# Patient Record
Sex: Male | Born: 1965 | Race: White | Hispanic: No | Marital: Married | Smoking: Never smoker
Health system: Southern US, Community
[De-identification: ages and names within clinical notes are randomized; demographics above are authoritative.]

## PROBLEM LIST (undated history)

## (undated) DIAGNOSIS — Z800 Family history of malignant neoplasm of digestive organs: Secondary | ICD-10-CM

## (undated) DIAGNOSIS — E7800 Pure hypercholesterolemia, unspecified: Secondary | ICD-10-CM

## (undated) DIAGNOSIS — R519 Headache, unspecified: Secondary | ICD-10-CM

## (undated) DIAGNOSIS — C772 Secondary and unspecified malignant neoplasm of intra-abdominal lymph nodes: Secondary | ICD-10-CM

## (undated) DIAGNOSIS — C187 Malignant neoplasm of sigmoid colon: Secondary | ICD-10-CM

## (undated) DIAGNOSIS — N289 Disorder of kidney and ureter, unspecified: Secondary | ICD-10-CM

## (undated) DIAGNOSIS — Z923 Personal history of irradiation: Secondary | ICD-10-CM

## (undated) DIAGNOSIS — E291 Testicular hypofunction: Secondary | ICD-10-CM

## (undated) DIAGNOSIS — Z87440 Personal history of urinary (tract) infections: Secondary | ICD-10-CM

## (undated) DIAGNOSIS — G709 Myoneural disorder, unspecified: Secondary | ICD-10-CM

## (undated) DIAGNOSIS — Z87442 Personal history of urinary calculi: Secondary | ICD-10-CM

## (undated) DIAGNOSIS — I10 Essential (primary) hypertension: Secondary | ICD-10-CM

## (undated) DIAGNOSIS — H8110 Benign paroxysmal vertigo, unspecified ear: Secondary | ICD-10-CM

## (undated) DIAGNOSIS — Z85038 Personal history of other malignant neoplasm of large intestine: Secondary | ICD-10-CM

## (undated) DIAGNOSIS — Z8041 Family history of malignant neoplasm of ovary: Secondary | ICD-10-CM

## (undated) DIAGNOSIS — R51 Headache: Secondary | ICD-10-CM

## (undated) DIAGNOSIS — K649 Unspecified hemorrhoids: Secondary | ICD-10-CM

## (undated) DIAGNOSIS — K219 Gastro-esophageal reflux disease without esophagitis: Secondary | ICD-10-CM

## (undated) DIAGNOSIS — Z8042 Family history of malignant neoplasm of prostate: Secondary | ICD-10-CM

## (undated) HISTORY — DX: Gastro-esophageal reflux disease without esophagitis: K21.9

## (undated) HISTORY — DX: Myoneural disorder, unspecified: G70.9

## (undated) HISTORY — PX: TONSILLECTOMY: SUR1361

## (undated) HISTORY — PX: UPPER GASTROINTESTINAL ENDOSCOPY: SHX188

## (undated) HISTORY — PX: LITHOTRIPSY: SUR834

## (undated) HISTORY — DX: Testicular hypofunction: E29.1

## (undated) HISTORY — DX: Secondary and unspecified malignant neoplasm of intra-abdominal lymph nodes: C77.2

## (undated) HISTORY — DX: Personal history of irradiation: Z92.3

## (undated) HISTORY — DX: Family history of malignant neoplasm of prostate: Z80.42

## (undated) HISTORY — DX: Personal history of other malignant neoplasm of large intestine: Z85.038

## (undated) HISTORY — DX: Family history of malignant neoplasm of digestive organs: Z80.0

## (undated) HISTORY — PX: COLONOSCOPY: SHX174

## (undated) HISTORY — DX: Family history of malignant neoplasm of ovary: Z80.41

## (undated) HISTORY — DX: Malignant neoplasm of sigmoid colon: C18.7

---

## 1999-02-16 ENCOUNTER — Encounter: Payer: Self-pay | Admitting: Internal Medicine

## 1999-02-16 ENCOUNTER — Emergency Department (HOSPITAL_COMMUNITY): Admission: EM | Admit: 1999-02-16 | Discharge: 1999-02-16 | Payer: Self-pay | Admitting: Internal Medicine

## 2001-05-08 ENCOUNTER — Encounter: Payer: Self-pay | Admitting: Urology

## 2001-05-08 ENCOUNTER — Ambulatory Visit (HOSPITAL_BASED_OUTPATIENT_CLINIC_OR_DEPARTMENT_OTHER): Admission: RE | Admit: 2001-05-08 | Discharge: 2001-05-08 | Payer: Self-pay | Admitting: Urology

## 2001-10-24 ENCOUNTER — Encounter: Payer: Self-pay | Admitting: Family Medicine

## 2001-10-24 ENCOUNTER — Encounter: Admission: RE | Admit: 2001-10-24 | Discharge: 2001-10-24 | Payer: Self-pay | Admitting: Family Medicine

## 2005-10-04 ENCOUNTER — Observation Stay (HOSPITAL_COMMUNITY): Admission: EM | Admit: 2005-10-04 | Discharge: 2005-10-05 | Payer: Self-pay | Admitting: *Deleted

## 2005-10-11 ENCOUNTER — Ambulatory Visit (HOSPITAL_COMMUNITY): Admission: RE | Admit: 2005-10-11 | Discharge: 2005-10-11 | Payer: Self-pay | Admitting: Urology

## 2011-01-22 ENCOUNTER — Encounter: Payer: Self-pay | Admitting: Emergency Medicine

## 2011-01-22 ENCOUNTER — Emergency Department (HOSPITAL_COMMUNITY)
Admission: EM | Admit: 2011-01-22 | Discharge: 2011-01-22 | Disposition: A | Discharge status: Home / Self Care | Payer: BC Managed Care – PPO | Attending: Emergency Medicine | Admitting: Emergency Medicine

## 2011-01-22 DIAGNOSIS — R198 Other specified symptoms and signs involving the digestive system and abdomen: Secondary | ICD-10-CM | POA: Insufficient documentation

## 2011-01-22 DIAGNOSIS — E789 Disorder of lipoprotein metabolism, unspecified: Secondary | ICD-10-CM | POA: Insufficient documentation

## 2011-01-22 DIAGNOSIS — K648 Other hemorrhoids: Secondary | ICD-10-CM

## 2011-01-22 DIAGNOSIS — K625 Hemorrhage of anus and rectum: Secondary | ICD-10-CM | POA: Insufficient documentation

## 2011-01-22 DIAGNOSIS — I10 Essential (primary) hypertension: Secondary | ICD-10-CM | POA: Insufficient documentation

## 2011-01-22 DIAGNOSIS — H5789 Other specified disorders of eye and adnexa: Secondary | ICD-10-CM | POA: Insufficient documentation

## 2011-01-22 HISTORY — DX: Essential (primary) hypertension: I10

## 2011-01-22 HISTORY — DX: Disorder of kidney and ureter, unspecified: N28.9

## 2011-01-22 HISTORY — DX: Pure hypercholesterolemia, unspecified: E78.00

## 2011-01-22 MED ORDER — HYDROCORTISONE 2.5 % RE CREA: TOPICAL_CREAM | RECTAL | Status: AC

## 2011-01-22 MED ORDER — DOCUSATE SODIUM 100 MG PO CAPS: 100.0000 mg | ORAL_CAPSULE | Freq: Two times a day (BID) | ORAL | Status: AC

## 2011-01-22 NOTE — ED Provider Notes (Signed)
History   This chart was scribed for Joya Gaskins, MD by Charolett Bumpers . The patient was seen in room APA12/APA12 and the patient's care was started at 3:08pm.    CSN: [id number]  Arrival date & time 01/22/11  1358   First MD Initiated Contact with Patient 01/22/11 1501      Chief Complaint  Patient presents with  . Rectal Bleeding     HPI Vincent Strickland is a 45 y.o. male who presents to the Emergency Department complaining of a single episode of constant, moderate rectal bleeding that was bright red with associated swelling. The patient denies pain, vomiting, abdominal pain, fever, weakness, and dizziness. The patient denies pain using the restroom, and no prior episodes this severe. The patient takes 1 aspirin a day, but denies taking anti-inflammatories daily. Patient states having no other major medical problems or prior major bleeding episodes before. Patient states he did have hemorrhoids 20 years ago.  No vomiting Improved by - nothing Worsened by - nothing  Past Medical History  Diagnosis Date  . Renal disorder   . Hypertension   . High cholesterol     Past Surgical History  Procedure Date  . Lithotripsy   . Tonsillectomy     No family history on file.  History  Substance Use Topics  . Smoking status: Never Smoker   . Smokeless tobacco: Not on file  . Alcohol Use: No      Review of Systems A complete 10 system review of systems was obtained and is otherwise negative except as noted in the HPI and PMH.    Allergies  Review of patient's allergies indicates no known allergies.  Home Medications  No current outpatient prescriptions on file.  BP 163/97  Pulse 81  Temp 98.3 F (36.8 C)  Resp 20  Ht 6\' 1"  (1.854 m)  Wt 235 lb (106.595 kg)  BMI 31.00 kg/m2  SpO2 100%  Physical Exam  Nursing note and vitals reviewed. Constitutional: He is oriented to person, place, and time. He appears well-developed and well-nourished. No distress.    HENT:  Head: Normocephalic and atraumatic.  Eyes: EOM are normal. Pupils are equal, round, and reactive to light.       Conjunctiva pink  Neck: Neck supple. No tracheal deviation present.  Cardiovascular: Normal rate.   Pulmonary/Chest: Effort normal. No respiratory distress.  Abdominal: Soft. He exhibits no distension.  Musculoskeletal: Normal range of motion. He exhibits no edema.  Neurological: He is alert and oriented to person, place, and time. No sensory deficit.  Skin: Skin is warm and dry.  Psychiatric: He has a normal mood and affect. His behavior is normal.  rectal  - gross blood noted but not actively bleeding, no melena, large protrusion that appears to be hemorrhoid No abscess, no prolapse.  I can easily pass finger into rectum.   ED Course  Procedures   DIAGNOSTIC STUDIES: Oxygen Saturation is 100% on room air, normal by my interpretation.    COORDINATION OF CARE:     3:32 PM D/w with dr Lovell Sheehan, likely internal hemorrhoids, start stool softeners, anusol, f/u next week Pt agreeable with plan  MDM  Nursing notes reviewed and considered in documentation    I personally performed the services described in this documentation, which was scribed in my presence. The recorded information has been reviewed and considered.         Joya Gaskins, MD 01/22/11 [phone]

## 2011-01-22 NOTE — ED Notes (Signed)
Pt states that he was at work, having a bowel movement, pt states that he has been constipated lately, was "bearing down" to produce bowel movement, had some lower abd discomfort with the bowel movement, but when pt was finished he wiped and noticed bright red blood, pt states that he felt something on his rectum area the size of a  "marble" pt had bleed through his jeans on arrival to triage, pt states that the bleeding has slowed now, denies any pain, has not had any problems with hemorrhoids before,

## 2011-01-22 NOTE — ED Notes (Signed)
Pt c/o bright red blood in toilet after having bowel movement x 1 hour ago. Pt states he "felt something" from his rectum when he cleaned off.

## 2011-01-22 NOTE — ED Notes (Signed)
Pt has a bowel movement prior to discharge, tolerated well,

## 2012-06-27 ENCOUNTER — Encounter: Payer: Self-pay | Admitting: Physician Assistant

## 2012-06-27 ENCOUNTER — Encounter: Payer: Self-pay | Admitting: Urology

## 2012-07-20 ENCOUNTER — Ambulatory Visit: Payer: Self-pay | Admitting: Family Medicine

## 2012-07-26 ENCOUNTER — Encounter: Payer: Self-pay | Admitting: Physician Assistant

## 2012-07-26 ENCOUNTER — Ambulatory Visit: Payer: Self-pay | Admitting: Family Medicine

## 2012-07-26 ENCOUNTER — Ambulatory Visit (INDEPENDENT_AMBULATORY_CARE_PROVIDER_SITE_OTHER): Payer: 59 | Admitting: Physician Assistant

## 2012-07-26 VITALS — BP 130/90 | HR 68 | Temp 98.5°F | Resp 16 | Ht 73.0 in | Wt 231.0 lb

## 2012-07-26 DIAGNOSIS — H8110 Benign paroxysmal vertigo, unspecified ear: Secondary | ICD-10-CM

## 2012-07-26 MED ORDER — MECLIZINE HCL 25 MG PO TABS: 25.0000 mg | ORAL_TABLET | Freq: Three times a day (TID) | ORAL | Status: DC | PRN

## 2012-07-26 NOTE — Progress Notes (Signed)
   Patient ID: Vincent Strickland MRN: [id number], DOB: 15-Dec-1965, 47 y.o. Date of Encounter: 07/26/2012, 8:17 PM    Chief Complaint:  Chief Complaint  Patient presents with  . Dizziness     HPI: 47 y.o. year old white male is here for eval of vertigo. He says this started Saturday night (4-5 days ago). During the night, when he rolled over in bed, there was a spinning sensation. Actual spinning. The next morning, when he first woke up, he felt a little stagerry/ like on a boat. But after up for a hour or so this resolved and he felt normal during the day. Sunday night he had no problem. But, on both Monday and Tuesday nights, he again had vertigo when he would roll over in bed. He has had no symptoms during the day. He does remember one time when he tilted his head back to look up at something, he felt mild symtoms but that was the only time. He has not noticed any symtoms when looking to the side, turning his head to the side.  Has had no recent rhinorrhea, nasal congestion, cough, fever, etc.  No other neurologic symptoms.   Home Meds: See attached medication section for any medications that were entered at today's visit. The computer does not put those onto this list.The following list is a list of meds entered prior to today's visit.   No current outpatient prescriptions on file prior to visit.   No current facility-administered medications on file prior to visit.    Allergies: No Known Allergies    Review of Systems: See HPI for pertinent ROS. All other ROS negative.    Physical Exam: Blood pressure 130/90, pulse 68, temperature 98.5 F (36.9 C), temperature source Oral, resp. rate 16, height 6\' 1"  (1.854 m), weight 231 lb (104.781 kg)., Body mass index is 30.48 kg/(m^2). General: WM. Appears in no acute distress. HEENT: Normocephalic, atraumatic, eyes without discharge, sclera non-icteric, nares are without discharge. Bilateral auditory canals clear, TM's are without perforation,  pearly grey and translucent with reflective cone of light bilaterally. Oral cavity moist, posterior pharynx without exudate, erythema, peritonsillar abscess, or post nasal drip.  Neck: Supple. No thyromegaly. No lymphadenopathy. Lungs: Clear bilaterally to auscultation without wheezes, rales, or rhonchi. Breathing is unlabored. Heart: Regular rhythm. No murmurs, rubs, or gallops. Msk:  Strength and tone normal for age. Neuro: Alert and oriented X 3. Moves all extremities spontaneously. Gait is normal. CNII-XII grossly in tact.Romberg normal. Patria Mane: Negative now bilaterally.   Psych:  Responds to questions appropriately with a normal affect.     ASSESSMENT AND PLAN:  47 y.o. year old male with  1. Benign paroxysmal positional vertigo - meclizine (ANTIVERT) 25 MG tablet; Take 1 tablet (25 mg total) by mouth 3 (three) times daily as needed.  Dispense: 30 tablet; Refill: 0 If does not resolve in one week call me and will refer for therapy.  [address] [place], [place], [facility] 07/26/2012 8:17 PM

## 2012-10-04 ENCOUNTER — Encounter: Payer: Self-pay | Admitting: Physician Assistant

## 2012-10-04 ENCOUNTER — Ambulatory Visit (INDEPENDENT_AMBULATORY_CARE_PROVIDER_SITE_OTHER): Payer: 59 | Admitting: Physician Assistant

## 2012-10-04 VITALS — BP 138/96 | HR 68 | Temp 98.1°F | Resp 18 | Ht 73.0 in | Wt 232.0 lb

## 2012-10-04 DIAGNOSIS — E7800 Pure hypercholesterolemia, unspecified: Secondary | ICD-10-CM

## 2012-10-04 DIAGNOSIS — N289 Disorder of kidney and ureter, unspecified: Secondary | ICD-10-CM

## 2012-10-04 DIAGNOSIS — I10 Essential (primary) hypertension: Secondary | ICD-10-CM

## 2012-10-04 DIAGNOSIS — E291 Testicular hypofunction: Secondary | ICD-10-CM

## 2012-10-04 MED ORDER — LOSARTAN POTASSIUM-HCTZ 100-25 MG PO TABS: 1.0000 | ORAL_TABLET | Freq: Every day | ORAL | Status: DC

## 2012-10-04 NOTE — Progress Notes (Signed)
Patient ID: Vincent Strickland MRN: [id number], DOB: 1965-06-13, 47 y.o. Date of Encounter: 10/04/2012, 12:04 PM    Chief Complaint:  Chief Complaint  Patient presents with  . med refills    is fasting     HPI: 47 y.o. year old white male here to followup his hypertension. His last regular office visit for this was on February 2014 with Dr. Modesto Charon. He says that he is taking that losartan 100 mg daily and took it this morning. He's been having no adverse effects with this. He does say that his prior blood pressure pill was changed to this because he was having some fatigue with his prior blood pressure medicine. He has no complaints today. He's been feeling pretty good. No lightheadedness or lower extremity edema or other adverse affects of his blood pressure pill.  Home Meds: See attached medication section for any medications that were entered at today's visit. The computer does not put those onto this list.The following list is a list of meds entered prior to today's visit.   Current Outpatient Prescriptions on File Prior to Visit  Medication Sig Dispense Refill  . meclizine (ANTIVERT) 25 MG tablet Take 1 tablet (25 mg total) by mouth 3 (three) times daily as needed.  30 tablet  0   No current facility-administered medications on file prior to visit.    Allergies:  Allergies  Allergen Reactions  . Niaspan [Niacin]     Severe flushing  . Percocet [Oxycodone-Acetaminophen] Nausea Only    hallucinations      Review of Systems: See HPI for pertinent ROS. All other ROS negative.    Physical Exam: Blood pressure 138/96, pulse 68, temperature 98.1 F (36.7 C), temperature source Oral, resp. rate 18, height 6\' 1"  (1.854 m), weight 232 lb (105.235 kg)., Body mass index is 30.62 kg/(m^2). Blood pressures by me on the right at 155/90 and on the left 150/100 General: Well-nourished well-developed white male.  Appears in no acute distress. Neck: Supple. No thyromegaly. No lymphadenopathy.  No carotid bruits Lungs: Clear bilaterally to auscultation without wheezes, rales, or rhonchi. Breathing is unlabored. Heart: Regular rhythm. No murmurs, rubs, or gallops. Msk:  Strength and tone normal for age. Extremities/Skin: Warm and dry. No clubbing or cyanosis. No edema. No rashes or suspicious lesions. Neuro: Alert and oriented X 3. Moves all extremities spontaneously. Gait is normal. CNII-XII grossly in tact. Psych:  Responds to questions appropriately with a normal affect.     ASSESSMENT AND PLAN:  47 y.o. year old male with  1. Hypertension His blood pressure is suboptimal and need to increase medications. In the past he had been on lisinopril HCTZ. I discussed with him that I really think it is probably the HCTZ component of this medicine that was causing his fatigue and weakness. However he absolutely refuses for me to add a second pill to his losartan. He says that he just does not want to have to take 2 pills every day and does not want to have to pay for 2 separate pills. I told him I can give him I could add a generic that would cost $4 and that he can take the  2 pills at the same time together at one time per day. Still refuses. Therefore we'll go ahead and try just adding HCTZ to that losartan 30 to take one pill per day. BMET was completely normal on 02/16/12. Therefore can just wait and should recheck bmet at his next office visit in 2 weeks. -  losartan-hydrochlorothiazide (HYZAAR) 100-25 MG per tablet; Take 1 tablet by mouth daily.  Dispense: 90 tablet; Refill: 3 Followup office visit in 2 weeks to recheck blood pressure on this medication and check the mats on this medication. I instructed him to make sure to take the pill prior to the visit.  2. High cholesterol I reviewed his last lipid profile from 02/16/12.  However he absolutely refuses to take medication for this. He says that he has made dietary changes and will continue his dietary changes. The only cardiac risk  factor is his hypertension so this is reasonable to just continue his diet.  3. Hypogonadism male History of low testosterone and he has deferred treatment and still wants to stay off of treatment for this.  4. Renal disorder He reports that he has had multiple kidney stones and sees urology routinely for this. Says he just recently passed a large stone.  Follow up office visit in 2 weeks to recheck blood pressure and BMET on new medication.   [address] [place], [place], [facility] 10/04/2012 12:04 PM

## 2012-10-05 ENCOUNTER — Telehealth: Payer: Self-pay | Admitting: Physician Assistant

## 2012-10-05 NOTE — Telephone Encounter (Signed)
Patient/wife upset with 90 day Rx for new BP med.  Does not want to pay for 90 days and then it does not work.  Only wants 30 day supply to start out with.  Pharmacy called and asked to dispense only 30 day supply for now.

## 2012-10-05 NOTE — Telephone Encounter (Signed)
That is fine to change to # 30 / 0 refills--b/c he is to have f/u appt and labs in 2-3 weeks.

## 2012-10-19 ENCOUNTER — Encounter: Payer: Self-pay | Admitting: Physician Assistant

## 2012-10-19 ENCOUNTER — Ambulatory Visit (INDEPENDENT_AMBULATORY_CARE_PROVIDER_SITE_OTHER): Payer: 59 | Admitting: Physician Assistant

## 2012-10-19 VITALS — BP 136/96 | HR 60 | Temp 98.2°F | Resp 18 | Wt 235.0 lb

## 2012-10-19 DIAGNOSIS — N289 Disorder of kidney and ureter, unspecified: Secondary | ICD-10-CM

## 2012-10-19 DIAGNOSIS — E7800 Pure hypercholesterolemia, unspecified: Secondary | ICD-10-CM

## 2012-10-19 DIAGNOSIS — E291 Testicular hypofunction: Secondary | ICD-10-CM

## 2012-10-19 DIAGNOSIS — I10 Essential (primary) hypertension: Secondary | ICD-10-CM

## 2012-10-19 LAB — BASIC METABOLIC PANEL WITH GFR
CO2: 31 mEq/L (ref 19–32)
Chloride: 102 mEq/L (ref 96–112)
Glucose, Bld: 92 mg/dL (ref 70–99)
Sodium: 141 mEq/L (ref 135–145)

## 2012-10-19 NOTE — Progress Notes (Signed)
   Patient ID: Avari Gelles MRN: [id number], DOB: 21-Nov-1965, 47 y.o. Date of Encounter: 10/19/2012, 2:13 PM    Chief Complaint:  Chief Complaint  Patient presents with  . follow up after starting new BP med     HPI: 47 y.o. year old white male old with hypertension. Last office visit with me was on 10/04/12. At that time his blood pressure with found to be elevated. Wire to that office visit he was on losartan 100 mg daily. At that visit I discussed adding another medication. He absolutely refused to take 2 pills even if it could be generic $4 pills. Therefore we ended up prescribing losartan HCT 100/25 mg. He is here for followup. He has been taking this new medication. Says he is having absolutely no adverse effects. Has not had his blood pressure checked anywhere else since the last office visit here.     Home Meds: See attached medication section for any medications that were entered at today's visit. The computer does not put those onto this list.The following list is a list of meds entered prior to today's visit.   Current Outpatient Prescriptions on File Prior to Visit  Medication Sig Dispense Refill  . losartan-hydrochlorothiazide (HYZAAR) 100-25 MG per tablet Take 1 tablet by mouth daily.  90 tablet  3  . meclizine (ANTIVERT) 25 MG tablet Take 1 tablet (25 mg total) by mouth 3 (three) times daily as needed.  30 tablet  0   No current facility-administered medications on file prior to visit.    Allergies:  Allergies  Allergen Reactions  . Niaspan [Niacin]     Severe flushing  . Percocet [Oxycodone-Acetaminophen] Nausea Only    hallucinations      Review of Systems: See HPI for pertinent ROS. All other ROS negative.    Physical Exam: Blood pressure 136/96, pulse 60, temperature 98.2 F (36.8 C), temperature source Oral, resp. rate 18, weight 235 lb (106.595 kg)., Body mass index is 31.01 kg/(m^2). General: Well-nourished well-developed white male. Appears in no acute  distress. Neck: Supple. No thyromegaly. No lymphadenopathy. No carotid bruit. Lungs: Clear bilaterally to auscultation without wheezes, rales, or rhonchi. Breathing is unlabored. Heart: Regular rhythm. No murmurs, rubs, or gallops. Msk:  Strength and tone normal for age. Extremities/Skin: Warm and dry. No clubbing or cyanosis. No edema. No rashes or suspicious lesions. Neuro: Alert and oriented X 3. Moves all extremities spontaneously. Gait is normal. CNII-XII grossly in tact. Psych:  Responds to questions appropriately with a normal affect.     ASSESSMENT AND PLAN:  48 y.o. year old male with  1. Hypertension We'll continue current medication. Losartan HCT 100/25. Check lab on this medication - BASIC METABOLIC PANEL WITH GFR  2. Renal disorder He reports that he has had multiple kidney stones and he sees urology routinely for this. Just recently passed a large stone.  3. High cholesterol His recent office visit 10/04/12 I discussed his last lipid profile from 02/16/12. However he absolutely refused to take any medication for this. He said that he had made dietary changes and to continue dietary changes. The only cardiac risk factor he has his hypertension without it was reasonable to discontinue his diet.  4. Hypogonadism male H/O low testosterone and he is deferred treatment and still wants to stay off of treatment for this.  Regular office visit and lab in 6 months.   Murray Hodgkins [place], [place], [facility] 10/19/2012 2:13 PM

## 2012-10-23 ENCOUNTER — Encounter: Payer: Self-pay | Admitting: Family Medicine

## 2013-01-30 ENCOUNTER — Other Ambulatory Visit: Payer: Self-pay | Admitting: Physician Assistant

## 2013-01-30 NOTE — Telephone Encounter (Signed)
Medication refilled per protocol. 

## 2013-08-17 ENCOUNTER — Other Ambulatory Visit: Payer: Self-pay | Admitting: Physician Assistant

## 2013-08-17 ENCOUNTER — Encounter: Payer: Self-pay | Admitting: Family Medicine

## 2013-08-17 DIAGNOSIS — I10 Essential (primary) hypertension: Secondary | ICD-10-CM

## 2013-08-17 NOTE — Telephone Encounter (Signed)
Medication refill for one time only.  Patient needs to be seen.  Letter sent for patient to call and schedule 

## 2013-09-19 ENCOUNTER — Other Ambulatory Visit: Payer: Self-pay | Admitting: Physician Assistant

## 2013-10-22 ENCOUNTER — Telehealth: Payer: Self-pay | Admitting: Family Medicine

## 2013-10-22 MED ORDER — LOSARTAN POTASSIUM-HCTZ 100-25 MG PO TABS
1.0000 | ORAL_TABLET | Freq: Every day | ORAL | Status: DC
Start: 2013-10-22 — End: 2014-05-08

## 2013-10-22 NOTE — Telephone Encounter (Signed)
Medication refilled per protocol. 

## 2013-11-20 ENCOUNTER — Encounter: Payer: Self-pay | Admitting: Family Medicine

## 2013-11-20 ENCOUNTER — Ambulatory Visit (INDEPENDENT_AMBULATORY_CARE_PROVIDER_SITE_OTHER): Payer: Managed Care, Other (non HMO) | Admitting: Family Medicine

## 2013-11-20 VITALS — BP 128/74 | HR 74 | Temp 98.2°F | Resp 16 | Ht 73.0 in | Wt 256.0 lb

## 2013-11-20 DIAGNOSIS — Z23 Encounter for immunization: Secondary | ICD-10-CM

## 2013-11-20 DIAGNOSIS — S39011A Strain of muscle, fascia and tendon of abdomen, initial encounter: Secondary | ICD-10-CM

## 2013-11-20 DIAGNOSIS — S76219A Strain of adductor muscle, fascia and tendon of unspecified thigh, initial encounter: Secondary | ICD-10-CM

## 2013-11-20 NOTE — Progress Notes (Signed)
   Subjective:    Patient ID: Vincent Strickland, male    DOB: 1965/02/17, 48 y.o.   MRN: [id number]  HPI  Patient was exercising yesterday performing curls. He was really straining to perform his final rep he felt a severe pain in his lower abdomen and in his growing. There is no bulge there today. There is no palpable hernia. Over the last 24 hours the pain has improved remarkably. Today he has no pain at all although there is some mild discomfort with position changes in his lower abdomen and in his groin. He denies any hematuria or dysuria. He denies any melena or hematochezia. He denies any nausea vomiting fever or diarrhea.  On exam there is no palpable hernia. Past Medical History  Diagnosis Date  . Hypertension   . High cholesterol   . Hypogonadism male   . Renal disorder     kidney stones   Past Surgical History  Procedure Laterality Date  . Lithotripsy    . Tonsillectomy     Current Outpatient Prescriptions on File Prior to Visit  Medication Sig Dispense Refill  . losartan-hydrochlorothiazide (HYZAAR) 100-25 MG per tablet Take 1 tablet by mouth daily.  30 tablet  5  . meclizine (ANTIVERT) 25 MG tablet Take 1 tablet (25 mg total) by mouth 3 (three) times daily as needed.  30 tablet  0   No current facility-administered medications on file prior to visit.   Allergies  Allergen Reactions  . Niaspan [Niacin]     Severe flushing  . Percocet [Oxycodone-Acetaminophen] Nausea Only    hallucinations   History   Social History  . Marital Status: Married    Spouse Name: N/A    Number of Children: N/A  . Years of Education: N/A   Occupational History  . Not on file.   Social History Main Topics  . Smoking status: Never Smoker   . Smokeless tobacco: Never Used  . Alcohol Use: No  . Drug Use: No  . Sexual Activity: Not on file   Other Topics Concern  . Not on file   Social History Narrative  . No narrative on file     Review of Systems  All other systems reviewed  and are negative.      Objective:   Physical Exam  Vitals reviewed. Constitutional: He appears well-developed and well-nourished.  Cardiovascular: Normal rate and regular rhythm.   Pulmonary/Chest: Effort normal and breath sounds normal.  Abdominal: Soft. Bowel sounds are normal. He exhibits no distension and no mass. There is no tenderness. There is no rebound and no guarding.  Musculoskeletal: Normal range of motion. He exhibits no edema and no tenderness.          Assessment & Plan:  Groin strain, initial encounter  I did not appreciate a hernia today on examination. I believe the patient has strained his growing. I recommended rest, avoidance of strenuous activity for 2 weeks, and tincture of time. If symptoms worsen, I want to recheck him immediately for possible hernia.

## 2013-11-20 NOTE — Addendum Note (Signed)
Addended by: Shary Decamp B on: 11/20/2013 12:21 PM   Modules accepted: Orders

## 2014-05-08 ENCOUNTER — Other Ambulatory Visit: Payer: Self-pay | Admitting: Physician Assistant

## 2014-05-08 NOTE — Telephone Encounter (Signed)
Refill appropriate and filled per protocol. 

## 2014-08-15 ENCOUNTER — Other Ambulatory Visit: Payer: Self-pay | Admitting: Family Medicine

## 2014-08-16 ENCOUNTER — Encounter: Payer: Self-pay | Admitting: Family Medicine

## 2014-08-16 NOTE — Telephone Encounter (Signed)
Medication refill for one time only.  Patient needs to be seen.  Letter sent for patient to call and schedule 

## 2014-09-14 ENCOUNTER — Other Ambulatory Visit: Payer: Self-pay | Admitting: Family Medicine

## 2014-12-30 ENCOUNTER — Other Ambulatory Visit: Payer: Self-pay | Admitting: Family Medicine

## 2014-12-30 MED ORDER — LOSARTAN POTASSIUM-HCTZ 100-25 MG PO TABS: ORAL_TABLET | ORAL | Status: DC

## 2015-05-06 ENCOUNTER — Encounter: Payer: Self-pay | Admitting: Gastroenterology

## 2015-05-06 ENCOUNTER — Encounter: Payer: Self-pay | Admitting: Family Medicine

## 2015-05-06 ENCOUNTER — Ambulatory Visit (INDEPENDENT_AMBULATORY_CARE_PROVIDER_SITE_OTHER): Payer: BLUE CROSS/BLUE SHIELD | Admitting: Family Medicine

## 2015-05-06 VITALS — BP 110/74 | HR 78 | Temp 98.2°F | Resp 16 | Ht 73.0 in | Wt 239.0 lb

## 2015-05-06 DIAGNOSIS — I10 Essential (primary) hypertension: Secondary | ICD-10-CM

## 2015-05-06 DIAGNOSIS — K625 Hemorrhage of anus and rectum: Secondary | ICD-10-CM | POA: Diagnosis not present

## 2015-05-06 LAB — LIPID PANEL
CHOLESTEROL: 224 mg/dL — AB (ref 125–200)
HDL: 32 mg/dL — AB (ref >=40)
LDL Cholesterol: 153 mg/dL — ABNORMAL HIGH (ref <130)
TRIGLYCERIDES: 196 mg/dL — AB (ref <150)
Total CHOL/HDL Ratio: 7 Ratio — ABNORMAL HIGH (ref <=5.0)
VLDL: 39 mg/dL — ABNORMAL HIGH (ref <30)

## 2015-05-06 LAB — CBC WITH DIFFERENTIAL/PLATELET
BASOS ABS: 70 {cells}/uL (ref 0–200)
BASOS PCT: 1 %
Eosinophils Absolute: 140 cells/uL (ref 15–500)
Eosinophils Relative: 2 %
HEMATOCRIT: 46.4 % (ref 38.5–50.0)
Hemoglobin: 16.3 g/dL (ref 13.0–17.0)
LYMPHS ABS: 2590 {cells}/uL (ref 850–3900)
LYMPHS PCT: 37 %
MCH: 31.2 pg (ref 27.0–33.0)
MCHC: 35.1 g/dL (ref 32.0–36.0)
MCV: 88.9 fL (ref 80.0–100.0)
MONO ABS: 420 {cells}/uL (ref 200–950)
MONOS PCT: 6 %
MPV: 9.4 fL (ref 7.5–12.5)
Neutro Abs: 3780 cells/uL (ref 1500–7800)
Neutrophils Relative %: 54 %
PLATELETS: 329 10*3/uL (ref 140–400)
RBC: 5.22 MIL/uL (ref 4.20–5.80)
RDW: 13.9 % (ref 11.0–15.0)
WBC: 7 10*3/uL (ref 3.8–10.8)

## 2015-05-06 LAB — COMPLETE METABOLIC PANEL WITH GFR
ALK PHOS: 70 U/L (ref 40–115)
ALT: 34 U/L (ref 9–46)
AST: 22 U/L (ref 10–40)
Albumin: 4.6 g/dL (ref 3.6–5.1)
BILIRUBIN TOTAL: 1 mg/dL (ref 0.2–1.2)
BUN: 17 mg/dL (ref 7–25)
CALCIUM: 9.6 mg/dL (ref 8.6–10.3)
CO2: 26 mmol/L (ref 20–31)
CREATININE: 1.01 mg/dL (ref 0.60–1.35)
Chloride: 102 mmol/L (ref 98–110)
GFR, EST NON AFRICAN AMERICAN: 88 mL/min (ref >=60)
Glucose, Bld: 108 mg/dL — ABNORMAL HIGH (ref 70–99)
Potassium: 3.2 mmol/L — ABNORMAL LOW (ref 3.5–5.3)
Sodium: 143 mmol/L (ref 135–146)
TOTAL PROTEIN: 7.4 g/dL (ref 6.1–8.1)

## 2015-05-06 MED ORDER — LOSARTAN POTASSIUM-HCTZ 50-12.5 MG PO TABS: 1.0000 | ORAL_TABLET | Freq: Every day | ORAL | Status: DC

## 2015-05-06 NOTE — Progress Notes (Signed)
   Subjective:    Patient ID: Vincent Strickland, male    DOB: May 01, 1965, 50 y.o.   MRN: [id number]  HPI Patient is currently on Hyzaar 100/25 one by mouth daily. His blood pressure is consistently 110-120/70-80. He is interested in taking last blood pressure medication. He denies any chest pain shortness of breath or dyspnea on exertion. He is due for fasting lab work. He is also reporting 4-6 weeks of bright red blood per rectum. He has a long-standing history with hemorrhoids. On rectal exam today he has 3 external skin tags but there is no obvious external hemorrhoid. On rectal exam there is what feels to be a palpable internal hemorrhoid versus skin tag at approximately 5:00 with the patient standing leaning over the exam table. Past Medical History  Diagnosis Date  . Hypertension   . High cholesterol   . Hypogonadism male   . Renal disorder     kidney stones   Past Surgical History  Procedure Laterality Date  . Lithotripsy    . Tonsillectomy     Current Outpatient Prescriptions on File Prior to Visit  Medication Sig Dispense Refill  . losartan-hydrochlorothiazide (HYZAAR) 100-25 MG tablet TAKE 1 TABLET BY MOUTH DAILY.NO MORE REFILLS UNTIL SEEN 30 tablet 0   No current facility-administered medications on file prior to visit.   Allergies  Allergen Reactions  . Niaspan [Niacin]     Severe flushing  . Percocet [Oxycodone-Acetaminophen] Nausea Only    hallucinations   Social History   Social History  . Marital Status: Married    Spouse Name: N/A  . Number of Children: N/A  . Years of Education: N/A   Occupational History  . Not on file.   Social History Main Topics  . Smoking status: Never Smoker   . Smokeless tobacco: Never Used  . Alcohol Use: No  . Drug Use: No  . Sexual Activity: Not on file   Other Topics Concern  . Not on file   Social History Narrative       Review of Systems  All other systems reviewed and are negative.      Objective:   Physical  Exam  Cardiovascular: Normal rate, regular rhythm and normal heart sounds.   Pulmonary/Chest: Effort normal and breath sounds normal. No respiratory distress. He has no wheezes. He has no rales.  Genitourinary: Prostate normal. Rectal exam shows no external hemorrhoid and no fissure.  Vitals reviewed.         Assessment & Plan:  Benign essential HTN - Plan: CBC with Differential/Platelet, COMPLETE METABOLIC PANEL WITH GFR, Lipid panel  BRBPR (bright red blood per rectum) - Plan: Ambulatory referral to Gastroenterology I will consult GI for a colonoscopy. There is a small what feels to be internal hemorrhoid versus polyp at approximately 5:00 with the patient standing leaning over the exam table. However there is no obvious source of bleeding. Blood pressure is excellent. I will reduce the dose of Hyzaar 50/12.5 one by mouth daily and check a CBC, CMP, and fasting lipid panel.

## 2015-05-08 ENCOUNTER — Other Ambulatory Visit: Payer: Self-pay | Admitting: Family Medicine

## 2015-05-08 MED ORDER — POTASSIUM CHLORIDE ER 10 MEQ PO TBCR: EXTENDED_RELEASE_TABLET | ORAL | Status: DC

## 2015-05-08 MED ORDER — PRAVASTATIN SODIUM 20 MG PO TABS: 20.0000 mg | ORAL_TABLET | Freq: Every day | ORAL | Status: DC

## 2015-06-06 ENCOUNTER — Ambulatory Visit (INDEPENDENT_AMBULATORY_CARE_PROVIDER_SITE_OTHER): Payer: BLUE CROSS/BLUE SHIELD | Admitting: Family Medicine

## 2015-06-06 ENCOUNTER — Encounter: Payer: Self-pay | Admitting: Family Medicine

## 2015-06-06 VITALS — BP 132/82 | HR 72 | Temp 98.1°F | Resp 16 | Ht 73.0 in | Wt 243.0 lb

## 2015-06-06 DIAGNOSIS — E876 Hypokalemia: Secondary | ICD-10-CM

## 2015-06-06 DIAGNOSIS — I10 Essential (primary) hypertension: Secondary | ICD-10-CM

## 2015-06-06 DIAGNOSIS — K625 Hemorrhage of anus and rectum: Secondary | ICD-10-CM

## 2015-06-06 DIAGNOSIS — Z0000 Encounter for general adult medical examination without abnormal findings: Secondary | ICD-10-CM

## 2015-06-06 DIAGNOSIS — Z23 Encounter for immunization: Secondary | ICD-10-CM | POA: Diagnosis not present

## 2015-06-06 DIAGNOSIS — E785 Hyperlipidemia, unspecified: Secondary | ICD-10-CM | POA: Diagnosis not present

## 2015-06-06 LAB — BASIC METABOLIC PANEL WITH GFR
BUN: 14 mg/dL (ref 7–25)
CALCIUM: 9 mg/dL (ref 8.6–10.3)
CO2: 27 mmol/L (ref 20–31)
Chloride: 105 mmol/L (ref 98–110)
Creat: 0.95 mg/dL (ref 0.60–1.35)
GLUCOSE: 94 mg/dL (ref 70–99)
POTASSIUM: 3.3 mmol/L — AB (ref 3.5–5.3)
SODIUM: 142 mmol/L (ref 135–146)

## 2015-06-06 NOTE — Progress Notes (Signed)
Subjective:    Patient ID: Vincent Strickland, male    DOB: 23-May-1965, 50 y.o.   MRN: [id number]  HPI  Please see the last office visit. If the patient's last office visit, his blood pressure medication was reduced to Hyzaar 50/12.5 and since making that change, his blood pressure has been averaging 130/80 at home. His last lab work also revealed hypokalemia that was treated with potassium repletion and also decreasing the dose of hydrochlorothiazide. He is due to recheck that. His insurance will not pay for the colonoscopy. Thankfully he is not experiencing any bright red blood per rectum. Therefore he believes it was likely due to an internal hemorrhoid. He has no family history of colon cancer. He would be interested in a cheaper option to screen for colon cancer given his hematochezia. He is due today for her tetanus shot No visits with results within 1 Month(s) from this visit. Latest known visit with results is:  Office Visit on 05/06/2015  Component Date Value Ref Range Status  . WBC 05/06/2015 7.0  3.8 - 10.8 K/uL Final  . RBC 05/06/2015 5.22  4.20 - 5.80 MIL/uL Final  . Hemoglobin 05/06/2015 16.3  13.0 - 17.0 g/dL Final  . HCT 05/06/2015 46.4  38.5 - 50.0 % Final  . MCV 05/06/2015 88.9  80.0 - 100.0 fL Final  . MCH 05/06/2015 31.2  27.0 - 33.0 pg Final  . MCHC 05/06/2015 35.1  32.0 - 36.0 g/dL Final  . RDW 05/06/2015 13.9  11.0 - 15.0 % Final  . Platelets 05/06/2015 329  140 - 400 K/uL Final  . MPV 05/06/2015 9.4  7.5 - 12.5 fL Final  . Neutro Abs 05/06/2015 3780  1500 - 7800 cells/uL Final  . Lymphs Abs 05/06/2015 2590  850 - 3900 cells/uL Final  . Monocytes Absolute 05/06/2015 420  200 - 950 cells/uL Final  . Eosinophils Absolute 05/06/2015 140  15 - 500 cells/uL Final  . Basophils Absolute 05/06/2015 70  0 - 200 cells/uL Final  . Neutrophils Relative % 05/06/2015 54   Final  . Lymphocytes Relative 05/06/2015 37   Final  . Monocytes Relative 05/06/2015 6   Final  . Eosinophils  Relative 05/06/2015 2   Final  . Basophils Relative 05/06/2015 1   Final  . Smear Review 05/06/2015 Criteria for review not met   Final   ** Please note change in unit of measure and reference range(s). **  . Sodium 05/06/2015 143  135 - 146 mmol/L Final  . Potassium 05/06/2015 3.2* 3.5 - 5.3 mmol/L Final  . Chloride 05/06/2015 102  98 - 110 mmol/L Final  . CO2 05/06/2015 26  20 - 31 mmol/L Final  . Glucose, Bld 05/06/2015 108* 70 - 99 mg/dL Final  . BUN 05/06/2015 17  7 - 25 mg/dL Final  . Creat 05/06/2015 1.01  0.60 - 1.35 mg/dL Final  . Total Bilirubin 05/06/2015 1.0  0.2 - 1.2 mg/dL Final  . Alkaline Phosphatase 05/06/2015 70  40 - 115 U/L Final  . AST 05/06/2015 22  10 - 40 U/L Final  . ALT 05/06/2015 34  9 - 46 U/L Final  . Total Protein 05/06/2015 7.4  6.1 - 8.1 g/dL Final  . Albumin 05/06/2015 4.6  3.6 - 5.1 g/dL Final  . Calcium 05/06/2015 9.6  8.6 - 10.3 mg/dL Final  . GFR, Est African American 05/06/2015 >89  >=60 mL/min Final  . GFR, Est Non African American 05/06/2015 88  >=60 mL/min Final  Comment:   The estimated GFR is a calculation valid for adults (>=36 years old) that uses the CKD-EPI algorithm to adjust for age and sex. It is   not to be used for children, pregnant women, hospitalized patients,    patients on dialysis, or with rapidly changing kidney function. According to the NKDEP, eGFR >89 is normal, 60-89 shows mild impairment, 30-59 shows moderate impairment, 15-29 shows severe impairment and <15 is ESRD.     Marland Kitchen Cholesterol 05/06/2015 224* 125 - 200 mg/dL Final  . Triglycerides 05/06/2015 196* <150 mg/dL Final  . HDL 05/06/2015 32* >=40 mg/dL Final  . Total CHOL/HDL Ratio 05/06/2015 7.0* <=5.0 Ratio Final  . VLDL 05/06/2015 39* <30 mg/dL Final  . LDL Cholesterol 05/06/2015 153* <130 mg/dL Final   Comment:   Total Cholesterol/HDL Ratio:CHD Risk                        Coronary Heart Disease Risk Table                                        Men        Women          1/2 Average Risk              3.4        3.3              Average Risk              5.0        4.4           2X Average Risk              9.6        7.1           3X Average Risk             23.4       11.0 Use the calculated Patient Ratio above and the CHD Risk table  to determine the patient's CHD Risk.    Past Medical History  Diagnosis Date  . Hypertension   . High cholesterol   . Hypogonadism male   . Renal disorder     kidney stones   Past Surgical History  Procedure Laterality Date  . Lithotripsy    . Tonsillectomy     Current Outpatient Prescriptions on File Prior to Visit  Medication Sig Dispense Refill  . cyclobenzaprine (FLEXERIL) 10 MG tablet   0  . losartan-hydrochlorothiazide (HYZAAR) 50-12.5 MG tablet Take 1 tablet by mouth daily. 90 tablet 3  . potassium chloride (K-DUR) 10 MEQ tablet Take 1 tablet (10 mEq total) by mouth daily x 5days 5 tablet 0  . pravastatin (PRAVACHOL) 20 MG tablet Take 1 tablet (20 mg total) by mouth daily. 90 tablet 1   No current facility-administered medications on file prior to visit.   Allergies  Allergen Reactions  . Niaspan [Niacin]     Severe flushing  . Percocet [Oxycodone-Acetaminophen] Nausea Only    hallucinations   Social History   Social History  . Marital Status: Married    Spouse Name: N/A  . Number of Children: N/A  . Years of Education: N/A   Occupational History  . Not on file.   Social History Main Topics  . Smoking status: Never Smoker   .  Smokeless tobacco: Never Used  . Alcohol Use: No  . Drug Use: No  . Sexual Activity: Not on file   Other Topics Concern  . Not on file   Social History Narrative   Family History  Problem Relation Age of Onset  . Hypertension Mother   . Cancer Father     prostate  . Hypertension Maternal Grandmother   . Diabetes Paternal Grandfather   . COPD Paternal Grandfather      Review of Systems  All other systems reviewed and are negative.       Objective:   Physical Exam  Constitutional: He is oriented to person, place, and time. He appears well-developed and well-nourished. No distress.  HENT:  Head: Normocephalic and atraumatic.  Right Ear: External ear normal.  Left Ear: External ear normal.  Nose: Nose normal.  Mouth/Throat: Oropharynx is clear and moist. No oropharyngeal exudate.  Eyes: Conjunctivae and EOM are normal. Pupils are equal, round, and reactive to light. Right eye exhibits no discharge. Left eye exhibits no discharge. No scleral icterus.  Neck: Normal range of motion. Neck supple. No JVD present. No tracheal deviation present. No thyromegaly present.  Cardiovascular: Normal rate, regular rhythm and intact distal pulses.  Exam reveals no gallop and no friction rub.   No murmur heard. Pulmonary/Chest: Effort normal and breath sounds normal. No stridor. No respiratory distress. He has no wheezes. He has no rales. He exhibits no tenderness.  Abdominal: Soft. Bowel sounds are normal. He exhibits no distension and no mass. There is no tenderness. There is no rebound and no guarding.  Musculoskeletal: Normal range of motion. He exhibits no edema or tenderness.  Lymphadenopathy:    He has no cervical adenopathy.  Neurological: He is alert and oriented to person, place, and time. He has normal reflexes. He displays normal reflexes. No cranial nerve deficit. He exhibits normal muscle tone. Coordination normal.  Skin: Skin is warm. No rash noted. He is not diaphoretic. No erythema. No pallor.  Psychiatric: He has a normal mood and affect. His behavior is normal. Judgment and thought content normal.  Vitals reviewed.         Assessment & Plan:  Low blood potassium - Plan: BASIC METABOLIC PANEL WITH GFR  Benign essential HTN  BRBPR (bright red blood per rectum)  Routine general medical examination at a health care facility  Blood pressure looks good. We will not change his blood pressure medication dose at the  current time. I will recheck a potassium level to ensure adequate repletion. Given his bright red blood per rectum I have recommended the cologuard. He is interested as long as it's not too expensive. He received his tetanus shot today. We discussed his lab work which revealed hyperglycemia. I recommended decreasing his consumption of carbohydrates and increasing his aerobic exercise to 30 minutes a day 5 days a week. I feel this will also help address his elevated triglycerides and increase his HDL cholesterol. Patient refuses to take statin medication due to the side effects he experiences. He is actually not taking the pravastatin that he has been prescribed. He states that they make him feel extremely tired whenever he takes them. He is interested in trying fish oil 2000 mg by mouth daily and aggressive lifestyle changes. Will recheck lab work in 6 months. I would recheck the patient in one year. I'm file with him coming in just fasting for labs in 6 months for a CMP and fasting lipid panel. I will place the  is worse today

## 2015-06-06 NOTE — Addendum Note (Signed)
Addended by: Shary Decamp B on: 06/06/2015 03:33 PM   Modules accepted: Orders

## 2015-06-13 ENCOUNTER — Telehealth: Payer: Self-pay | Admitting: Family Medicine

## 2015-06-13 NOTE — Telephone Encounter (Signed)
Pt and his wife have a few questions about his lab results and would like to speak with a nurse [phone]

## 2015-06-13 NOTE — Telephone Encounter (Signed)
Call placed to patient. LMTRC.  

## 2015-06-16 ENCOUNTER — Ambulatory Visit: Payer: Self-pay | Admitting: Gastroenterology

## 2015-06-20 MED ORDER — POTASSIUM CHLORIDE CRYS ER 20 MEQ PO TBCR: 20.0000 meq | EXTENDED_RELEASE_TABLET | Freq: Every day | ORAL | Status: DC

## 2015-06-20 NOTE — Telephone Encounter (Signed)
Spoke to wife and questions answered and pt is doing good on bp med and would like to have potassium called in and remain on BP med. Med sent to pharm

## 2015-07-14 ENCOUNTER — Other Ambulatory Visit: Payer: Self-pay | Admitting: Family Medicine

## 2015-07-15 ENCOUNTER — Telehealth: Payer: Self-pay | Admitting: Family Medicine

## 2015-07-15 NOTE — Telephone Encounter (Signed)
Pt would like for Dr. Dennard Schaumann to call him regarding a medical test that he ordered.  [phone]

## 2015-07-15 NOTE — Telephone Encounter (Signed)
Refill appropriate and filled per protocol. 

## 2015-07-17 NOTE — Telephone Encounter (Signed)
Spoke to pt and he wanted to talk about cologuard - they will send positive test if test shows blood and will not test for cancer cells if positive for blood. He elects not to do test cause he does have hemorrhoids and will test positive for blood. Dr. Dennard Schaumann aware

## 2015-08-19 ENCOUNTER — Other Ambulatory Visit: Payer: Self-pay | Admitting: Family Medicine

## 2015-08-19 MED ORDER — LOSARTAN POTASSIUM-HCTZ 100-25 MG PO TABS: 1.0000 | ORAL_TABLET | Freq: Every day | ORAL | 3 refills | Status: DC

## 2015-08-19 NOTE — Telephone Encounter (Signed)
Medication called/sent to requested pharmacy  

## 2015-09-22 ENCOUNTER — Telehealth: Payer: Self-pay | Admitting: Family Medicine

## 2015-09-22 NOTE — Telephone Encounter (Signed)
Patient would like to speak to you regarding a bill  [phone]

## 2015-10-08 NOTE — Telephone Encounter (Signed)
Patient has set up payment arrangements.

## 2015-11-05 ENCOUNTER — Other Ambulatory Visit: Payer: Self-pay | Admitting: Family Medicine

## 2015-12-30 ENCOUNTER — Inpatient Hospital Stay (HOSPITAL_COMMUNITY)
Admission: EM | Admit: 2015-12-30 | Discharge: 2016-01-05 | DRG: 330 | Disposition: A | Discharge status: Home / Self Care | Payer: BLUE CROSS/BLUE SHIELD | Attending: Internal Medicine | Admitting: Internal Medicine

## 2015-12-30 ENCOUNTER — Encounter (HOSPITAL_COMMUNITY): Payer: Self-pay | Admitting: Emergency Medicine

## 2015-12-30 DIAGNOSIS — E7800 Pure hypercholesterolemia, unspecified: Secondary | ICD-10-CM | POA: Diagnosis present

## 2015-12-30 DIAGNOSIS — K760 Fatty (change of) liver, not elsewhere classified: Secondary | ICD-10-CM | POA: Diagnosis present

## 2015-12-30 DIAGNOSIS — K649 Unspecified hemorrhoids: Secondary | ICD-10-CM

## 2015-12-30 DIAGNOSIS — E291 Testicular hypofunction: Secondary | ICD-10-CM | POA: Diagnosis present

## 2015-12-30 DIAGNOSIS — C19 Malignant neoplasm of rectosigmoid junction: Secondary | ICD-10-CM | POA: Diagnosis not present

## 2015-12-30 DIAGNOSIS — K625 Hemorrhage of anus and rectum: Secondary | ICD-10-CM | POA: Diagnosis not present

## 2015-12-30 DIAGNOSIS — C189 Malignant neoplasm of colon, unspecified: Secondary | ICD-10-CM

## 2015-12-30 DIAGNOSIS — K922 Gastrointestinal hemorrhage, unspecified: Secondary | ICD-10-CM | POA: Diagnosis present

## 2015-12-30 DIAGNOSIS — I10 Essential (primary) hypertension: Secondary | ICD-10-CM | POA: Diagnosis present

## 2015-12-30 DIAGNOSIS — D72829 Elevated white blood cell count, unspecified: Secondary | ICD-10-CM | POA: Diagnosis present

## 2015-12-30 DIAGNOSIS — Z79899 Other long term (current) drug therapy: Secondary | ICD-10-CM

## 2015-12-30 DIAGNOSIS — E876 Hypokalemia: Secondary | ICD-10-CM | POA: Diagnosis present

## 2015-12-30 DIAGNOSIS — K644 Residual hemorrhoidal skin tags: Secondary | ICD-10-CM | POA: Diagnosis present

## 2015-12-30 DIAGNOSIS — K635 Polyp of colon: Secondary | ICD-10-CM | POA: Diagnosis present

## 2015-12-30 DIAGNOSIS — Z885 Allergy status to narcotic agent status: Secondary | ICD-10-CM

## 2015-12-30 DIAGNOSIS — E785 Hyperlipidemia, unspecified: Secondary | ICD-10-CM | POA: Diagnosis present

## 2015-12-30 DIAGNOSIS — Z888 Allergy status to other drugs, medicaments and biological substances status: Secondary | ICD-10-CM

## 2015-12-30 DIAGNOSIS — Z8249 Family history of ischemic heart disease and other diseases of the circulatory system: Secondary | ICD-10-CM

## 2015-12-30 DIAGNOSIS — K633 Ulcer of intestine: Secondary | ICD-10-CM | POA: Diagnosis present

## 2015-12-30 HISTORY — DX: Unspecified hemorrhoids: K64.9

## 2015-12-30 LAB — COMPREHENSIVE METABOLIC PANEL
ALT: 42 U/L (ref 17–63)
AST: 27 U/L (ref 15–41)
Albumin: 4.8 g/dL (ref 3.5–5.0)
Alkaline Phosphatase: 70 U/L (ref 38–126)
Anion gap: 9 (ref 5–15)
BUN: 16 mg/dL (ref 6–20)
CHLORIDE: 106 mmol/L (ref 101–111)
CO2: 27 mmol/L (ref 22–32)
Calcium: 9.2 mg/dL (ref 8.9–10.3)
Creatinine, Ser: 0.96 mg/dL (ref 0.61–1.24)
GFR calc Af Amer: >60 mL/min (ref >60)
Glucose, Bld: 116 mg/dL — ABNORMAL HIGH (ref 65–99)
POTASSIUM: 3 mmol/L — AB (ref 3.5–5.1)
SODIUM: 142 mmol/L (ref 135–145)
Total Bilirubin: 1.1 mg/dL (ref 0.3–1.2)
Total Protein: 7.7 g/dL (ref 6.5–8.1)

## 2015-12-30 LAB — TYPE AND SCREEN
ABO/RH(D): A POS
Antibody Screen: NEGATIVE

## 2015-12-30 LAB — CBC
HEMATOCRIT: 42.9 % (ref 39.0–52.0)
Hemoglobin: 15.4 g/dL (ref 13.0–17.0)
MCH: 30.7 pg (ref 26.0–34.0)
MCHC: 35.9 g/dL (ref 30.0–36.0)
MCV: 85.5 fL (ref 78.0–100.0)
Platelets: 299 10*3/uL (ref 150–400)
RBC: 5.02 MIL/uL (ref 4.22–5.81)
RDW: 13.5 % (ref 11.5–15.5)
WBC: 7.1 10*3/uL (ref 4.0–10.5)

## 2015-12-30 LAB — ABO/RH: ABO/RH(D): A POS

## 2015-12-30 MED ORDER — BISACODYL 5 MG PO TBEC
10.0000 mg | DELAYED_RELEASE_TABLET | Freq: Once | ORAL | Status: AC
  Administered 2015-12-30: 10 mg via ORAL
  Filled 2015-12-30: qty 2

## 2015-12-30 MED ORDER — POTASSIUM CHLORIDE CRYS ER 20 MEQ PO TBCR
40.0000 meq | EXTENDED_RELEASE_TABLET | ORAL | Status: AC
  Administered 2015-12-30 (×2): 40 meq via ORAL
  Filled 2015-12-30 (×2): qty 2

## 2015-12-30 MED ORDER — PEG-KCL-NACL-NASULF-NA ASC-C 100 G PO SOLR
0.5000 | Freq: Once | ORAL | Status: AC
  Administered 2015-12-30: 100 g via ORAL
  Filled 2015-12-30: qty 1

## 2015-12-30 MED ORDER — PEG-KCL-NACL-NASULF-NA ASC-C 100 G PO SOLR: 1.0000 | Freq: Once | ORAL | Status: DC

## 2015-12-30 MED ORDER — PEG-KCL-NACL-NASULF-NA ASC-C 100 G PO SOLR
0.5000 | Freq: Once | ORAL | Status: AC
  Administered 2015-12-30: 100 g via ORAL

## 2015-12-30 NOTE — ED Notes (Signed)
Stool grossly positive for blood.  EDP aware.  Cancel hemmoccult per EDP

## 2015-12-30 NOTE — H&P (Signed)
History and Physical    Vincent Strickland [id number] DOB: [birth year] DOA: 12/30/2015  PCP: Anthoney Harada, MD  Patient coming from: Home  Chief Complaint: Bright red blood per rectum  HPI: Vincent Strickland is a 50 y.o. male with medical history significant of hemorrhoids, kidney stones, hypertension. Patient reports a few hour history of bright red blood per rectum. He reports having episodes of bright red blood per rectum on and off for a few years. This has been actually related to hemorrhoids. His primary care physician was in discussion with him regarding possible early colonoscopy. This morning, however, while at work, he had 2 episodes of red watery stools. At that time, he decided to seek medical attention in the emergency part. Since being in the emergency department, he has had 3 more bright red stools. He does not take anything for his GI bleeding and has not tried anything today. There are no aggravating factors. He has no associated chest pain, dyspnea, palpitations, abdominal pain.  ED Course: Vitals: Afebrile, normal pulse, normal respirations, slightly elevated diastolic blood pressures. Labs: Hemoglobin 15.4. Potassium 3.0. Imaging: None obtained Medications/Course: Nothing given while in the ED  Review of Systems: Review of Systems  Constitutional: Negative for chills and fever.  Respiratory: Negative for cough, hemoptysis, sputum production, shortness of breath and wheezing.   Cardiovascular: Negative for chest pain, palpitations, orthopnea and leg swelling.  Gastrointestinal: Positive for blood in stool. Negative for abdominal pain, constipation, diarrhea, nausea and vomiting.  All other systems reviewed and are negative.   Past Medical History:  Diagnosis Date  . Hemorrhoid   . High cholesterol   . Hypertension   . Hypogonadism male   . Renal disorder    kidney stones    Past Surgical History:  Procedure Laterality Date  . LITHOTRIPSY    . TONSILLECTOMY        reports that he has never smoked. He has never used smokeless tobacco. He reports that he does not drink alcohol or use drugs.  Allergies  Allergen Reactions  . Niaspan [Niacin]     Severe flushing  . Percocet [Oxycodone-Acetaminophen] Nausea Only    hallucinations    Family History  Problem Relation Age of Onset  . Hypertension Mother   . Cancer Father     prostate  . Hypertension Maternal Grandmother   . Diabetes Paternal Grandfather   . COPD Paternal Grandfather     Prior to Admission medications   Medication Sig Start Date End Date Taking? Authorizing Provider  losartan-hydrochlorothiazide (HYZAAR) 100-25 MG tablet Take 1 tablet by mouth daily. 08/19/15  Yes Susy Frizzle, MD  potassium chloride SA (K-DUR,KLOR-CON) 20 MEQ tablet Take 1 tablet (20 mEq total) by mouth daily. 06/20/15  Yes Susy Frizzle, MD  potassium chloride (K-DUR) 10 MEQ tablet Take 1 tablet (10 mEq total) by mouth daily x 5days Patient not taking: Reported on 12/30/2015 05/08/15   Susy Frizzle, MD  pravastatin (PRAVACHOL) 20 MG tablet TAKE 1 TABLET (20 MG TOTAL) BY MOUTH DAILY. Patient not taking: Reported on 12/30/2015 11/05/15   Susy Frizzle, MD    Physical Exam: Vitals:   12/30/15 0840 12/30/15 0843 12/30/15 0844 12/30/15 1230  BP: (!) 138/112 138/98  133/95  Pulse: 84   80  Resp: 18   16  Temp: 98.4 F (36.9 C)     TempSrc: Oral     SpO2: 98%   98%  Weight:  110.2 kg (243 lb) 112.4 kg (247 lb 12.8  oz)   Height:  6' (1.829 m)       Constitutional: NAD, calm, comfortable Eyes: PERRL, lids and conjunctivae normal ENMT: Mucous membranes are moist. Posterior pharynx clear of any exudate or lesions. Normal dentition.  Neck: normal, supple, no masses, no thyromegaly Respiratory: clear to auscultation bilaterally, no wheezing, no crackles. Normal respiratory effort. No accessory muscle use.  Cardiovascular: Regular rate and rhythm, no murmurs / rubs / gallops. No extremity edema. 2+  pedal pulses.  Abdomen: no tenderness, no masses palpated. No hepatosplenomegaly. Bowel sounds positive.  Musculoskeletal: no clubbing / cyanosis. No joint deformity upper and lower extremities. Good ROM, no contractures. Normal muscle tone.  Skin: no rashes, lesions, ulcers. No induration Neurologic: CN 2-12 grossly intact. Sensation intact, DTR normal. Strength 5/5 in all 4.  Psychiatric: Normal judgment and insight. Alert and oriented x 3. Normal mood.    Labs on Admission: I have personally reviewed following labs and imaging studies  CBC:  Recent Labs Lab 12/30/15 0859  WBC 7.1  HGB 15.4  HCT 42.9  MCV 85.5  PLT 123XX123   Basic Metabolic Panel:  Recent Labs Lab 12/30/15 0859  NA 142  K 3.0*  CL 106  CO2 27  GLUCOSE 116*  BUN 16  CREATININE 0.96  CALCIUM 9.2   GFR: Estimated Creatinine Clearance: 121.8 mL/min (by C-G formula based on SCr of 0.96 mg/dL). Liver Function Tests:  Recent Labs Lab 12/30/15 0859  AST 27  ALT 42  ALKPHOS 70  BILITOT 1.1  PROT 7.7  ALBUMIN 4.8   No results for input(s): LIPASE, AMYLASE in the last 168 hours. No results for input(s): AMMONIA in the last 168 hours. Coagulation Profile: No results for input(s): INR, PROTIME in the last 168 hours. Cardiac Enzymes: No results for input(s): CKTOTAL, CKMB, CKMBINDEX, TROPONINI in the last 168 hours. BNP (last 3 results) No results for input(s): PROBNP in the last 8760 hours. HbA1C: No results for input(s): HGBA1C in the last 72 hours. CBG: No results for input(s): GLUCAP in the last 168 hours. Lipid Profile: No results for input(s): CHOL, HDL, LDLCALC, TRIG, CHOLHDL, LDLDIRECT in the last 72 hours. Thyroid Function Tests: No results for input(s): TSH, T4TOTAL, FREET4, T3FREE, THYROIDAB in the last 72 hours. Anemia Panel: No results for input(s): VITAMINB12, FOLATE, FERRITIN, TIBC, IRON, RETICCTPCT in the last 72 hours. Urine analysis: No results found for: COLORURINE, APPEARANCEUR,  LABSPEC, PHURINE, GLUCOSEU, HGBUR, BILIRUBINUR, KETONESUR, PROTEINUR, UROBILINOGEN, NITRITE, LEUKOCYTESUR Sepsis Labs: !!!!!!!!!!!!!!!!!!!!!!!!!!!!!!!!!!!!!!!!!!!! @LABRCNTIP (procalcitonin:4,lacticidven:4) )No results found for this or any previous visit (from the past 240 hour(s)).   Radiological Exams on Admission: No results found.  EKG: None obtained  Assessment/Plan Active Problems:   Hypertension   Pure hypercholesterolemia   GI bleed   Hemorrhoids  Bright red blood per rectum Seems likely to be lower. Patient has a history of hemorrhoids. Hemoglobin stable. GI consulted and will perform a colonoscopy -GI recommendations: colonoscopy -NPO after midnight -Transfusion threshold of 7  Hemorrhoids Chronic. Could be contributing to current GI bleed. Hemoglobin stable  Hypertension Patient not on medication.  Hyperlipidemia Patient states he was managing this with diet.  Hypokalemia -replete   DVT prophylaxis: SCDs Code Status: Full code Family Communication: Mom and dad at bedside Disposition Plan: Stay discharge home pending colonoscopy results Consults called: Gastroenterology by EDP Admission status: Observation, medical floor   Cordelia Poche, MD [facility] Pager [phone]  If 7PM-7AM, please contact night-coverage [email or web address] Password [email or web address]  12/30/2015, 12:49 PM

## 2015-12-30 NOTE — ED Provider Notes (Signed)
[facility] DEPT Provider Note   CSN: [id number] Arrival date & time: 12/30/15  [id number]     History   Chief Complaint Chief Complaint  Patient presents with  . Rectal Bleeding    HPI Vincent Strickland is a 50 y.o. male.  The history is provided by the patient.  Rectal Bleeding  Quality:  Bright red Amount:  Moderate Duration: severe months, but worsened this am. Timing:  Intermittent Chronicity:  Recurrent Context: hemorrhoids   Similar prior episodes: no   Relieved by:  None tried Associated symptoms: abdominal pain (intermittent cramping)   Associated symptoms: no fever, no hematemesis, no light-headedness, no loss of consciousness, no recent illness and no vomiting   Risk factors: no anticoagulant use and no hx of colorectal cancer     Past Medical History:  Diagnosis Date  . Hemorrhoid   . High cholesterol   . Hypertension   . Hypogonadism male   . Renal disorder    kidney stones    Patient Active Problem List   Diagnosis Date Noted  . GI bleed 12/30/2015  . Renal disorder   . Hypertension   . High cholesterol   . Hypogonadism male     Past Surgical History:  Procedure Laterality Date  . LITHOTRIPSY    . TONSILLECTOMY         Home Medications    Prior to Admission medications   Medication Sig Start Date End Date Taking? Authorizing Provider  losartan-hydrochlorothiazide (HYZAAR) 100-25 MG tablet Take 1 tablet by mouth daily. 08/19/15  Yes Susy Frizzle, MD  potassium chloride SA (K-DUR,KLOR-CON) 20 MEQ tablet Take 1 tablet (20 mEq total) by mouth daily. 06/20/15  Yes Susy Frizzle, MD  potassium chloride (K-DUR) 10 MEQ tablet Take 1 tablet (10 mEq total) by mouth daily x 5days Patient not taking: Reported on 12/30/2015 05/08/15   Susy Frizzle, MD  pravastatin (PRAVACHOL) 20 MG tablet TAKE 1 TABLET (20 MG TOTAL) BY MOUTH DAILY. Patient not taking: Reported on 12/30/2015 11/05/15   Susy Frizzle, MD    Family History Family History    Problem Relation Age of Onset  . Hypertension Mother   . Cancer Father     prostate  . Hypertension Maternal Grandmother   . Diabetes Paternal Grandfather   . COPD Paternal Grandfather     Social History Social History  Substance Use Topics  . Smoking status: Never Smoker  . Smokeless tobacco: Never Used  . Alcohol use No     Allergies   Niaspan [niacin] and Percocet [oxycodone-acetaminophen]   Review of Systems Review of Systems  Constitutional: Negative for chills and fever.  HENT: Negative for ear pain and sore throat.   Eyes: Negative for pain and visual disturbance.  Respiratory: Negative for cough and shortness of breath.   Cardiovascular: Negative for chest pain and palpitations.  Gastrointestinal: Positive for abdominal pain (intermittent cramping), blood in stool and hematochezia. Negative for diarrhea, hematemesis, nausea and vomiting.  Genitourinary: Negative for dysuria and hematuria.  Musculoskeletal: Negative for arthralgias and back pain.  Skin: Negative for color change and rash.  Neurological: Negative for seizures, loss of consciousness, syncope and light-headedness.  All other systems reviewed and are negative.    Physical Exam Updated Vital Signs BP 138/98 (BP Location: Left Arm)   Pulse 84   Temp 98.4 F (36.9 C) (Oral)   Resp 18   Ht 6' (1.829 m)   Wt 247 lb 12.8 oz (112.4 kg)   SpO2  98%   BMI 33.61 kg/m   Physical Exam  Constitutional: He is oriented to person, place, and time. He appears well-developed and well-nourished. No distress.  HENT:  Head: Normocephalic and atraumatic.  Nose: Nose normal.  Eyes: Conjunctivae and EOM are normal. Pupils are equal, round, and reactive to light. Right eye exhibits no discharge. Left eye exhibits no discharge. No scleral icterus.  Neck: Normal range of motion. Neck supple.  Cardiovascular: Normal rate and regular rhythm.  Exam reveals no gallop and no friction rub.   No murmur  heard. Pulmonary/Chest: Effort normal and breath sounds normal. No stridor. No respiratory distress. He has no rales.  Abdominal: Soft. He exhibits no distension. There is no tenderness.  Genitourinary: Rectal exam shows external hemorrhoid and internal hemorrhoid. Rectal exam shows no fissure and anal tone normal.  Genitourinary Comments: Internal hemorrhoids without active bleeding. Blood noted to be coming from above the visible region.  Musculoskeletal: He exhibits no edema or tenderness.  Neurological: He is alert and oriented to person, place, and time.  Skin: Skin is warm and dry. No rash noted. He is not diaphoretic. No erythema.  Psychiatric: He has a normal mood and affect.  Vitals reviewed.    ED Treatments / Results  Labs (all labs ordered are listed, but only abnormal results are displayed) Labs Reviewed  COMPREHENSIVE METABOLIC PANEL - Abnormal; Notable for the following:       Result Value   Potassium 3.0 (*)    Glucose, Bld 116 (*)    All other components within normal limits  CBC  POC OCCULT BLOOD, ED  TYPE AND SCREEN  ABO/RH    EKG  EKG Interpretation None       Radiology No results found.  Procedures LOWER ENDOSCOPY Date/Time: 12/30/2015 9:58 AM Performed by: Fatima Blank Authorized by: Fatima Blank  Consent: Verbal consent obtained. Consent given by: patient Patient understanding: patient states understanding of the procedure being performed Patient identity confirmed: verbally with patient and arm band Time out: Immediately prior to procedure a "time out" was called to verify the correct patient, procedure, equipment, support staff and site/side marked as required. Indications: rectal bleeding  Sedation: Patient sedated: no Scope type: anoscope External exam performed: yes Positive external exam findings: perianal skin tags and external hemorrhoids Negative external exam findings: no pilonidal sinus tract, no pilonidal  cyst, no pilonidal tenderness, no perianal maceration, no perianal induration and no perianal erythema Digital exam performed: yes Positive digital exam findings: prostate enlargement Negative digital exam findings: no laxity of anal sphincter and no prostate tenderness Positive internal exam findings: internal hemorrhoid Negative internal exam findings: no abscess Internal hemorrhoid position: twelve o'clock, one o'clock and two o'clock Internal hemorrhoid prolapsed: no Patient tolerance: Patient tolerated the procedure well with no immediate complications     (including critical care time)  Medications Ordered in ED Medications - No data to display   Initial Impression / Assessment and Plan / ED Course  I have reviewed the triage vital signs and the nursing notes.  Pertinent labs & imaging results that were available during my care of the patient were reviewed by me and considered in my medical decision making (see chart for details).  Internal hemorrhoids without active bleeding. Blood noted to be coming from above the visible region.  Clinical Course as of Dec 29 1256  Tue Dec 30, 2015  1211 Hb stable. Labs w/o AKI. Pt continues to have large bloody BMs. Discussed case with GI  and will plan to admit for colonscopy.  Appreciate hospitalist admission  [PC]    Clinical Course User Index [PC] Fatima Blank, MD      Final Clinical Impressions(s) / ED Diagnoses   Final diagnoses:  Acute GI bleeding    Disposition: Admit  Condition: stable    Fatima Blank, MD 12/30/15 1300

## 2015-12-30 NOTE — ED Triage Notes (Signed)
Patient reports blood in stool starting this morning. Hx of hemorrhoids and kidney stones. Patient was recently seen by PCP for left lower abdominal pain and blood was found in his urine.

## 2015-12-30 NOTE — ED Notes (Signed)
ED Provider at bedside. 

## 2015-12-30 NOTE — ED Notes (Signed)
Attempted to call report to floor-unable to accept at present.  RN to call ED when ready for report.

## 2015-12-30 NOTE — Consult Note (Signed)
[facility] Consultation Note  Referring Provider: [facility] Primary Care Physician:  Anthoney Harada, MD Primary Gastroenterologist:  None  Reason for Consultation:  Hematochezia  HPI: Vincent Strickland is a 50 y.o. male whom I've been asked to see for evaluation of hematochezia.  For several years, since early adulthood, patient has had intermittent bouts of hematochezia.  Few months ago, began having intermittent crampy lower abdominal pain.  Large amount of hematochezia today, necessitating ED visit.  Mild lower abdominal pain.  No constipation or straining.  No weight loss.  No melena or weight loss.  Occasional NSAIDs for headaches.  No prior colonoscopy.  No family history of inflammatory bowel disease, colon cancer, colon polyps in first-degree relatives.  Hgb on arrival 15.   Past Medical History:  Diagnosis Date  . Hemorrhoid   . High cholesterol   . Hypertension   . Hypogonadism male   . Renal disorder    kidney stones    Past Surgical History:  Procedure Laterality Date  . LITHOTRIPSY    . TONSILLECTOMY      Prior to Admission medications   Medication Sig Start Date End Date Taking? Authorizing Provider  losartan-hydrochlorothiazide (HYZAAR) 100-25 MG tablet Take 1 tablet by mouth daily. 08/19/15  Yes Susy Frizzle, MD  potassium chloride SA (K-DUR,KLOR-CON) 20 MEQ tablet Take 1 tablet (20 mEq total) by mouth daily. 06/20/15  Yes Susy Frizzle, MD  potassium chloride (K-DUR) 10 MEQ tablet Take 1 tablet (10 mEq total) by mouth daily x 5days Patient not taking: Reported on 12/30/2015 05/08/15   Susy Frizzle, MD  pravastatin (PRAVACHOL) 20 MG tablet TAKE 1 TABLET (20 MG TOTAL) BY MOUTH DAILY. Patient not taking: Reported on 12/30/2015 11/05/15   Susy Frizzle, MD    No current facility-administered medications for this encounter.    Current Outpatient Prescriptions  Medication Sig Dispense Refill  . losartan-hydrochlorothiazide (HYZAAR)  100-25 MG tablet Take 1 tablet by mouth daily. 90 tablet 3  . potassium chloride SA (K-DUR,KLOR-CON) 20 MEQ tablet Take 1 tablet (20 mEq total) by mouth daily. 90 tablet 3  . potassium chloride (K-DUR) 10 MEQ tablet Take 1 tablet (10 mEq total) by mouth daily x 5days (Patient not taking: Reported on 12/30/2015) 5 tablet 0  . pravastatin (PRAVACHOL) 20 MG tablet TAKE 1 TABLET (20 MG TOTAL) BY MOUTH DAILY. (Patient not taking: Reported on 12/30/2015) 90 tablet 1    Allergies as of 12/30/2015 - Review Complete 12/30/2015  Allergen Reaction Noted  . Niaspan [niacin]  10/04/2012  . Percocet [oxycodone-acetaminophen] Nausea Only 10/04/2012    Family History  Problem Relation Age of Onset  . Hypertension Mother   . Cancer Father     prostate  . Hypertension Maternal Grandmother   . Diabetes Paternal Grandfather   . COPD Paternal Grandfather     Social History   Social History  . Marital status: Married    Spouse name: N/A  . Number of children: N/A  . Years of education: N/A   Occupational History  . Not on file.   Social History Main Topics  . Smoking status: Never Smoker  . Smokeless tobacco: Never Used  . Alcohol use No  . Drug use: No  . Sexual activity: Not on file   Other Topics Concern  . Not on file   Social History Narrative  . No narrative on file    Review of Systems: Positive = bold Gen: Denies any fever, chills, rigors, night sweats,  anorexia, fatigue, weakness, malaise, involuntary weight loss, and sleep disorder CV: Denies chest pain, angina, palpitations, syncope, orthopnea, PND, peripheral edema, and claudication. Resp: Denies dyspnea, cough, sputum, wheezing, coughing up blood. GI: Described in detail in HPI.    GU : Denies urinary burning, blood in urine, urinary frequency, urinary hesitancy, nocturnal urination, and urinary incontinence. MS: Denies joint pain or swelling.  Denies muscle weakness, cramps, atrophy.  Derm: Denies rash, itching, oral  ulcerations, hives, unhealing ulcers.  Psych: Denies depression, anxiety, memory loss, suicidal ideation, hallucinations,  and confusion. Heme: Denies bruising, bleeding, and enlarged lymph nodes. Neuro:  Denies any headaches, dizziness, paresthesias. Endo:  Denies any problems with DM, thyroid, adrenal function.  Physical Exam: Vital signs in last 24 hours: Temp:  [98.4 F (36.9 C)] 98.4 F (36.9 C) (12/05 0840) Pulse Rate:  [80-84] 80 (12/05 1230) Resp:  [16-18] 16 (12/05 1230) BP: (133-138)/(95-112) 133/95 (12/05 1230) SpO2:  [98 %] 98 % (12/05 1230) Weight:  [110.2 kg (243 lb)-112.4 kg (247 lb 12.8 oz)] 112.4 kg (247 lb 12.8 oz) (12/05 0844)   General:   Alert,  Well-developed, well-nourished, pleasant and cooperative in NAD Head:  Normocephalic and atraumatic. Eyes:  Sclera clear, no icterus.   Conjunctiva pink. Ears:  Normal auditory acuity. Nose:  No deformity, discharge,  or lesions. Mouth:  No deformity or lesions.  Oropharynx pink & moist. Neck:  Supple; no masses or thyromegaly. Lungs:  Clear throughout to auscultation.   No wheezes, crackles, or rhonchi. No acute distress. Heart:  Regular rate and rhythm; no murmurs, clicks, rubs,  or gallops. Abdomen:  Soft, protuberant, nontender and nondistended. No masses, hepatosplenomegaly or hernias noted. Normal bowel sounds, without guarding, and without rebound.     Msk:  Symmetrical without gross deformities. Normal posture. Pulses:  Normal pulses noted. Extremities:  Without clubbing or edema. Neurologic:  Alert and  oriented x4;  grossly normal neurologically. Skin:  Intact without significant lesions or rashes. Psych:  Alert and cooperative. Normal mood and affect.   Lab Results:  Recent Labs  12/30/15 0859  WBC 7.1  HGB 15.4  HCT 42.9  PLT 299   BMET  Recent Labs  12/30/15 0859  NA 142  K 3.0*  CL 106  CO2 27  GLUCOSE 116*  BUN 16  CREATININE 0.96  CALCIUM 9.2   LFT  Recent Labs  12/30/15 0859   PROT 7.7  ALBUMIN 4.8  AST 27  ALT 42  ALKPHOS 70  BILITOT 1.1   PT/INR No results for input(s): LABPROT, INR in the last 72 hours.  Studies/Results: No results found.  Impression:  1.  Hematochezia, non-destabilizing. 2.  Hemorrhoids.  Plan:  1.  Clear liquids. 2.  Daily CBCs should suffice. 3.  Colonoscopy tomorrow for further evaluation. 4.  Risks (bleeding, infection, bowel perforation that could require surgery, sedation-related changes in cardiopulmonary systems), benefits (identification and possible treatment of source of symptoms, exclusion of certain causes of symptoms), and alternatives (watchful waiting, radiographic imaging studies, empiric medical treatment) of colonoscopy were explained to patient/family in detail and patient wishes to proceed.   LOS: 0 days   Ronnell Clinger M  12/30/2015, 12:55 PM  Pager [phone] If no answer or after 5 PM call [phone]

## 2015-12-30 NOTE — ED Notes (Signed)
Report called to Regina, RN.

## 2015-12-31 ENCOUNTER — Encounter (HOSPITAL_COMMUNITY)
Admission: EM | Disposition: A | Discharge status: Home / Self Care | Payer: Self-pay | Source: Home / Self Care | Attending: Internal Medicine

## 2015-12-31 ENCOUNTER — Encounter (HOSPITAL_COMMUNITY): Payer: Self-pay

## 2015-12-31 ENCOUNTER — Observation Stay (HOSPITAL_COMMUNITY): Payer: BLUE CROSS/BLUE SHIELD

## 2015-12-31 ENCOUNTER — Observation Stay (HOSPITAL_COMMUNITY): Payer: BLUE CROSS/BLUE SHIELD | Admitting: Certified Registered"

## 2015-12-31 DIAGNOSIS — K921 Melena: Secondary | ICD-10-CM | POA: Diagnosis not present

## 2015-12-31 DIAGNOSIS — K644 Residual hemorrhoidal skin tags: Secondary | ICD-10-CM | POA: Diagnosis present

## 2015-12-31 DIAGNOSIS — I10 Essential (primary) hypertension: Secondary | ICD-10-CM

## 2015-12-31 DIAGNOSIS — Z888 Allergy status to other drugs, medicaments and biological substances status: Secondary | ICD-10-CM | POA: Diagnosis not present

## 2015-12-31 DIAGNOSIS — K635 Polyp of colon: Secondary | ICD-10-CM | POA: Diagnosis present

## 2015-12-31 DIAGNOSIS — K639 Disease of intestine, unspecified: Secondary | ICD-10-CM

## 2015-12-31 DIAGNOSIS — K649 Unspecified hemorrhoids: Secondary | ICD-10-CM | POA: Diagnosis not present

## 2015-12-31 DIAGNOSIS — Z885 Allergy status to narcotic agent status: Secondary | ICD-10-CM | POA: Diagnosis not present

## 2015-12-31 DIAGNOSIS — K625 Hemorrhage of anus and rectum: Secondary | ICD-10-CM

## 2015-12-31 DIAGNOSIS — D125 Benign neoplasm of sigmoid colon: Secondary | ICD-10-CM

## 2015-12-31 DIAGNOSIS — K922 Gastrointestinal hemorrhage, unspecified: Secondary | ICD-10-CM | POA: Diagnosis present

## 2015-12-31 DIAGNOSIS — K633 Ulcer of intestine: Secondary | ICD-10-CM | POA: Diagnosis present

## 2015-12-31 DIAGNOSIS — Z79899 Other long term (current) drug therapy: Secondary | ICD-10-CM | POA: Diagnosis not present

## 2015-12-31 DIAGNOSIS — C187 Malignant neoplasm of sigmoid colon: Secondary | ICD-10-CM | POA: Diagnosis not present

## 2015-12-31 DIAGNOSIS — E785 Hyperlipidemia, unspecified: Secondary | ICD-10-CM | POA: Diagnosis present

## 2015-12-31 DIAGNOSIS — D72829 Elevated white blood cell count, unspecified: Secondary | ICD-10-CM | POA: Diagnosis present

## 2015-12-31 DIAGNOSIS — R918 Other nonspecific abnormal finding of lung field: Secondary | ICD-10-CM | POA: Diagnosis not present

## 2015-12-31 DIAGNOSIS — E7800 Pure hypercholesterolemia, unspecified: Secondary | ICD-10-CM | POA: Diagnosis present

## 2015-12-31 DIAGNOSIS — C19 Malignant neoplasm of rectosigmoid junction: Secondary | ICD-10-CM | POA: Diagnosis present

## 2015-12-31 DIAGNOSIS — E876 Hypokalemia: Secondary | ICD-10-CM | POA: Diagnosis present

## 2015-12-31 DIAGNOSIS — E291 Testicular hypofunction: Secondary | ICD-10-CM | POA: Diagnosis present

## 2015-12-31 DIAGNOSIS — K760 Fatty (change of) liver, not elsewhere classified: Secondary | ICD-10-CM | POA: Diagnosis present

## 2015-12-31 DIAGNOSIS — Z8249 Family history of ischemic heart disease and other diseases of the circulatory system: Secondary | ICD-10-CM | POA: Diagnosis not present

## 2015-12-31 HISTORY — PX: COLONOSCOPY WITH PROPOFOL: SHX5780

## 2015-12-31 LAB — CBC
HCT: 39 % (ref 39.0–52.0)
HCT: 38.5 % — ABNORMAL LOW (ref 39.0–52.0)
HCT: 36.7 % — ABNORMAL LOW (ref 39.0–52.0)
HEMATOCRIT: 41.3 % (ref 39.0–52.0)
HEMOGLOBIN: 13.3 g/dL (ref 13.0–17.0)
Hemoglobin: 14.9 g/dL (ref 13.0–17.0)
Hemoglobin: 13.9 g/dL (ref 13.0–17.0)
Hemoglobin: 13.8 g/dL (ref 13.0–17.0)
MCH: 31.4 pg (ref 26.0–34.0)
MCH: 30.8 pg (ref 26.0–34.0)
MCH: 30.5 pg (ref 26.0–34.0)
MCH: 31.1 pg (ref 26.0–34.0)
MCHC: 36.1 g/dL — AB (ref 30.0–36.0)
MCHC: 35.6 g/dL (ref 30.0–36.0)
MCHC: 35.8 g/dL (ref 30.0–36.0)
MCHC: 36.2 g/dL — ABNORMAL HIGH (ref 30.0–36.0)
MCV: 86.9 fL (ref 78.0–100.0)
MCV: 86.5 fL (ref 78.0–100.0)
MCV: 85 fL (ref 78.0–100.0)
MCV: 85.9 fL (ref 78.0–100.0)
PLATELETS: 283 10*3/uL (ref 150–400)
PLATELETS: 263 10*3/uL (ref 150–400)
PLATELETS: 260 10*3/uL (ref 150–400)
Platelets: 271 10*3/uL (ref 150–400)
RBC: 4.75 MIL/uL (ref 4.22–5.81)
RBC: 4.51 MIL/uL (ref 4.22–5.81)
RBC: 4.53 MIL/uL (ref 4.22–5.81)
RBC: 4.27 MIL/uL (ref 4.22–5.81)
RDW: 13.5 % (ref 11.5–15.5)
RDW: 13.5 % (ref 11.5–15.5)
RDW: 13.4 % (ref 11.5–15.5)
RDW: 13.3 % (ref 11.5–15.5)
WBC: 8.7 10*3/uL (ref 4.0–10.5)
WBC: 6.5 10*3/uL (ref 4.0–10.5)
WBC: 7.4 10*3/uL (ref 4.0–10.5)
WBC: 8.8 10*3/uL (ref 4.0–10.5)

## 2015-12-31 LAB — COMPREHENSIVE METABOLIC PANEL
ALK PHOS: 61 U/L (ref 38–126)
ALT: 46 U/L (ref 17–63)
AST: 27 U/L (ref 15–41)
Albumin: 4.1 g/dL (ref 3.5–5.0)
Anion gap: 8 (ref 5–15)
BILIRUBIN TOTAL: 1 mg/dL (ref 0.3–1.2)
BUN: 15 mg/dL (ref 6–20)
CALCIUM: 8.9 mg/dL (ref 8.9–10.3)
CHLORIDE: 105 mmol/L (ref 101–111)
CO2: 28 mmol/L (ref 22–32)
CREATININE: 0.81 mg/dL (ref 0.61–1.24)
Glucose, Bld: 97 mg/dL (ref 65–99)
Potassium: 3 mmol/L — ABNORMAL LOW (ref 3.5–5.1)
Sodium: 141 mmol/L (ref 135–145)
TOTAL PROTEIN: 6.8 g/dL (ref 6.5–8.1)

## 2015-12-31 SURGERY — COLONOSCOPY WITH PROPOFOL
Anesthesia: Monitor Anesthesia Care | Laterality: Left

## 2015-12-31 MED ORDER — ACETAMINOPHEN 325 MG PO TABS
650.0000 mg | ORAL_TABLET | Freq: Four times a day (QID) | ORAL | Status: DC | PRN
  Administered 2015-12-31: 650 mg via ORAL
  Filled 2015-12-31: qty 2

## 2015-12-31 MED ORDER — HYDRALAZINE HCL 20 MG/ML IJ SOLN: 5.0000 mg | Freq: Four times a day (QID) | INTRAMUSCULAR | Status: DC | PRN

## 2015-12-31 MED ORDER — ACETAMINOPHEN 650 MG RE SUPP: 650.0000 mg | Freq: Four times a day (QID) | RECTAL | Status: DC | PRN

## 2015-12-31 MED ORDER — LIDOCAINE 2% (20 MG/ML) 5 ML SYRINGE
INTRAMUSCULAR | Status: AC
  Filled 2015-12-31: qty 10

## 2015-12-31 MED ORDER — ONDANSETRON HCL 4 MG/2ML IJ SOLN
INTRAMUSCULAR | Status: DC | PRN
  Administered 2015-12-31: 4 mg via INTRAVENOUS

## 2015-12-31 MED ORDER — SODIUM CHLORIDE 0.9 % IJ SOLN
INTRAMUSCULAR | Status: AC
  Filled 2015-12-31: qty 50

## 2015-12-31 MED ORDER — PROPOFOL 10 MG/ML IV BOLUS
INTRAVENOUS | Status: AC
  Filled 2015-12-31: qty 20

## 2015-12-31 MED ORDER — LACTATED RINGERS IV SOLN
INTRAVENOUS | Status: DC | PRN
  Administered 2015-12-31: 09:00:00 via INTRAVENOUS

## 2015-12-31 MED ORDER — IOPAMIDOL (ISOVUE-370) INJECTION 76%
INTRAVENOUS | Status: AC
  Filled 2015-12-31: qty 100

## 2015-12-31 MED ORDER — ONDANSETRON HCL 4 MG/2ML IJ SOLN
INTRAMUSCULAR | Status: AC
  Filled 2015-12-31: qty 4

## 2015-12-31 MED ORDER — METHYLENE BLUE 0.5 % INJ SOLN
INTRAVENOUS | Status: AC
  Filled 2015-12-31: qty 10

## 2015-12-31 MED ORDER — POTASSIUM CHLORIDE CRYS ER 20 MEQ PO TBCR: 40.0000 meq | EXTENDED_RELEASE_TABLET | Freq: Two times a day (BID) | ORAL | Status: AC

## 2015-12-31 MED ORDER — LOSARTAN POTASSIUM-HCTZ 100-25 MG PO TABS: 1.0000 | ORAL_TABLET | Freq: Every day | ORAL | Status: DC

## 2015-12-31 MED ORDER — LACTATED RINGERS IV SOLN
INTRAVENOUS | Status: DC
  Administered 2015-12-31: 09:00:00 via INTRAVENOUS

## 2015-12-31 MED ORDER — MIDAZOLAM HCL 2 MG/2ML IJ SOLN
INTRAMUSCULAR | Status: AC
  Filled 2015-12-31: qty 2

## 2015-12-31 MED ORDER — MIDAZOLAM HCL 2 MG/2ML IJ SOLN
INTRAMUSCULAR | Status: DC | PRN
  Administered 2015-12-31: 2 mg via INTRAVENOUS

## 2015-12-31 MED ORDER — DEXTROSE-NACL 5-0.45 % IV SOLN
INTRAVENOUS | Status: DC
Start: 2015-12-31 — End: 2016-01-01
  Administered 2015-12-31 – 2016-01-01 (×2): via INTRAVENOUS

## 2015-12-31 MED ORDER — ERYTHROMYCIN BASE 250 MG PO TABS
1000.0000 mg | ORAL_TABLET | Freq: Three times a day (TID) | ORAL | Status: AC
  Administered 2015-12-31 (×3): 1000 mg via ORAL
  Filled 2015-12-31 (×3): qty 4

## 2015-12-31 MED ORDER — SPOT INK MARKER SYRINGE KIT
PACK | SUBMUCOSAL | Status: DC | PRN
  Administered 2015-12-31: 1 mL via SUBMUCOSAL

## 2015-12-31 MED ORDER — IOPAMIDOL (ISOVUE-300) INJECTION 61%
100.0000 mL | Freq: Once | INTRAVENOUS | Status: AC | PRN
  Administered 2015-12-31: 100 mL via INTRAVENOUS

## 2015-12-31 MED ORDER — DEXTROSE 5 % IV SOLN
2.0000 g | INTRAVENOUS | Status: AC
  Administered 2016-01-01: 2 g via INTRAVENOUS
  Filled 2015-12-31: qty 2

## 2015-12-31 MED ORDER — PROPOFOL 10 MG/ML IV BOLUS
INTRAVENOUS | Status: DC | PRN
  Administered 2015-12-31 (×10): 40 mg via INTRAVENOUS

## 2015-12-31 MED ORDER — PHENYLEPHRINE 40 MCG/ML (10ML) SYRINGE FOR IV PUSH (FOR BLOOD PRESSURE SUPPORT)
PREFILLED_SYRINGE | INTRAVENOUS | Status: AC
  Filled 2015-12-31: qty 20

## 2015-12-31 MED ORDER — SPOT INK MARKER SYRINGE KIT
PACK | SUBMUCOSAL | Status: AC
  Filled 2015-12-31: qty 5

## 2015-12-31 MED ORDER — LIDOCAINE 2% (20 MG/ML) 5 ML SYRINGE
INTRAMUSCULAR | Status: DC | PRN
  Administered 2015-12-31: 40 mg via INTRAVENOUS

## 2015-12-31 MED ORDER — HYDROCHLOROTHIAZIDE 25 MG PO TABS: 25.0000 mg | ORAL_TABLET | Freq: Every day | ORAL | Status: DC

## 2015-12-31 MED ORDER — IOPAMIDOL (ISOVUE-300) INJECTION 61%
30.0000 mL | Freq: Once | INTRAVENOUS | Status: AC
  Administered 2015-12-31: 30 mL via ORAL

## 2015-12-31 MED ORDER — LOSARTAN POTASSIUM 50 MG PO TABS: 100.0000 mg | ORAL_TABLET | Freq: Every day | ORAL | Status: DC

## 2015-12-31 MED ORDER — EPHEDRINE 5 MG/ML INJ
INTRAVENOUS | Status: AC
  Filled 2015-12-31: qty 10

## 2015-12-31 SURGICAL SUPPLY — 22 items

## 2015-12-31 NOTE — Consult Note (Signed)
[facility] Surgery Consult Note  Vincent Strickland 1965-04-07  [id number].    Requesting MD: Paulita Fujita, MD Chief Complaint/Reason for Consult: Sigmoid mass with concern for colon cancer HPI:  50 year-old male with a h/o HTN, external hemorrhoids, and nephrolithiasis who presented to [facility] for evaluation of hematochezia. He has had intermittent mild rectal bleeding for 2-3 years which he attributed to hemorrhoids. Larger volume hematochezia brought him to the emergency department for evaluation yesterday - he has never had large volume, frank blood per rectum before yesterday. Associated symptoms include intermittent, dull lower abdominal pain, rated 1/10. He denies fever, chills, decreased appetite, constipation, melena, or weight loss. Colonoscopy performed my Dr. 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 General surgery has been asked to consult. CEA pending. CT scan chest, abdomen, pelvis being performed today. Patient has no known family history of colon cancer.   ROS: Review of Systems  Constitutional: Negative for chills, fever, malaise/fatigue and weight loss.  Eyes: Negative for double vision.  Respiratory: Negative for shortness of breath.   Cardiovascular: Negative for chest pain.  Gastrointestinal: Positive for abdominal pain and blood in stool. Negative for constipation, diarrhea, nausea and vomiting.  Genitourinary: Negative for dysuria.  Neurological: Positive for headaches. Negative for dizziness.     Family History  Problem Relation Age of Onset  . Hypertension Mother   . Cancer Father     prostate  . Migraines Father   . Hypertension Maternal Grandmother   . Diabetes Paternal Grandfather   . COPD Paternal Grandfather     Past Medical History:  Diagnosis Date  . Hemorrhoid   . High  cholesterol   . Hypertension   . Hypogonadism male   . Renal disorder    kidney stones    Past Surgical History:  Procedure Laterality Date  . LITHOTRIPSY    . TONSILLECTOMY      Social History:  reports that he has never smoked. He has never used smokeless tobacco. He reports that he does not drink alcohol or use drugs.  Allergies:  Allergies  Allergen Reactions  . Niaspan [Niacin]     Severe flushing  . Percocet [Oxycodone-Acetaminophen] Nausea Only    hallucinations    Medications Prior to Admission  Medication Sig Dispense Refill  . losartan-hydrochlorothiazide (HYZAAR) 100-25 MG tablet Take 1 tablet by mouth daily. 90 tablet 3  . potassium chloride SA (K-DUR,KLOR-CON) 20 MEQ tablet Take 1 tablet (20 mEq total) by mouth daily. 90 tablet 3  . potassium chloride (K-DUR) 10 MEQ tablet Take 1 tablet (10 mEq total) by mouth daily x 5days (Patient not taking: Reported on 12/30/2015) 5 tablet 0  . pravastatin (PRAVACHOL) 20 MG tablet TAKE 1 TABLET (20 MG TOTAL) BY MOUTH DAILY. (Patient not taking: Reported on 12/30/2015) 90 tablet 1    Blood pressure (!) 146/79, pulse 63, temperature 98.1 F (36.7 C), temperature source Oral, resp. rate 18, height 6' (1.829 m), weight 247 lb (112 kg), SpO2 99 %. Physical Exam: General: pleasant white male who is laying in bed in NAD HEENT: head is normocephalic, atraumatic. Mouth is pink and moist Heart: regular, rate, and rhythm.  No obvious murmurs, gallops, or rubs noted.  Palpable pedal pulses bilaterally Lungs: CTAB, no wheezes, rhonchi, or rales noted.  Respiratory effort nonlabored Abd: soft, NT/ND, +BS, no masses,  hernias, or organomegaly MS: all 4 extremities are symmetrical with no cyanosis, clubbing, or edema. Skin: warm and dry with no masses, lesions, or rashes Psych: A&Ox3 with an appropriate affect. Neuro: grossly intact, normal speech  Results for orders placed or performed during the hospital encounter of 12/30/15 (from the  past 48 hour(s))  ABO/Rh     Status: None   Collection Time: 12/30/15  8:56 AM  Result Value Ref Range   ABO/RH(D) A POS   Comprehensive metabolic panel     Status: Abnormal   Collection Time: 12/30/15  8:59 AM  Result Value Ref Range   Sodium 142 135 - 145 mmol/L   Potassium 3.0 (L) 3.5 - 5.1 mmol/L   Chloride 106 101 - 111 mmol/L   CO2 27 22 - 32 mmol/L   Glucose, Bld 116 (H) 65 - 99 mg/dL   BUN 16 6 - 20 mg/dL   Creatinine, Ser 0.96 0.61 - 1.24 mg/dL   Calcium 9.2 8.9 - 10.3 mg/dL   Total Protein 7.7 6.5 - 8.1 g/dL   Albumin 4.8 3.5 - 5.0 g/dL   AST 27 15 - 41 U/L   ALT 42 17 - 63 U/L   Alkaline Phosphatase 70 38 - 126 U/L   Total Bilirubin 1.1 0.3 - 1.2 mg/dL   GFR calc non Af Amer >60 >60 mL/min   GFR calc Af Amer >60 >60 mL/min    Comment: (NOTE) The eGFR has been calculated using the CKD EPI equation. This calculation has not been validated in all clinical situations. eGFR's persistently <60 mL/min signify possible Chronic Kidney Disease.    Anion gap 9 5 - 15  CBC     Status: None   Collection Time: 12/30/15  8:59 AM  Result Value Ref Range   WBC 7.1 4.0 - 10.5 K/uL   RBC 5.02 4.22 - 5.81 MIL/uL   Hemoglobin 15.4 13.0 - 17.0 g/dL   HCT 42.9 39.0 - 52.0 %   MCV 85.5 78.0 - 100.0 fL   MCH 30.7 26.0 - 34.0 pg   MCHC 35.9 30.0 - 36.0 g/dL   RDW 13.5 11.5 - 15.5 %   Platelets 299 150 - 400 K/uL  Type and screen [place]     Status: None   Collection Time: 12/30/15  8:59 AM  Result Value Ref Range   ABO/RH(D) A POS    Antibody Screen NEG    Sample Expiration 01/02/2016   CBC     Status: Abnormal   Collection Time: 12/31/15  2:28 AM  Result Value Ref Range   WBC 8.7 4.0 - 10.5 K/uL   RBC 4.75 4.22 - 5.81 MIL/uL   Hemoglobin 14.9 13.0 - 17.0 g/dL   HCT 41.3 39.0 - 52.0 %   MCV 86.9 78.0 - 100.0 fL   MCH 31.4 26.0 - 34.0 pg   MCHC 36.1 (H) 30.0 - 36.0 g/dL   RDW 13.5 11.5 - 15.5 %   Platelets 283 150 - 400 K/uL  CBC     Status: None    Collection Time: 12/31/15  7:36 AM  Result Value Ref Range   WBC 6.5 4.0 - 10.5 K/uL   RBC 4.51 4.22 - 5.81 MIL/uL   Hemoglobin 13.9 13.0 - 17.0 g/dL   HCT 39.0 39.0 - 52.0 %   MCV 86.5 78.0 - 100.0 fL   MCH 30.8 26.0 - 34.0 pg   MCHC 35.6 30.0 - 36.0 g/dL   RDW 13.5  11.5 - 15.5 %   Platelets 263 150 - 400 K/uL   No results found.    Assessment/Plan Sigmoid mass Hematochezia  Colonoscopy 12/31/15 Dr Paulita Fujita - removal 8 mm polyp, Bx of sigmoid mass - follow path  CT Chest/Abd/Pelvis for staging  CEA pending   Continue NPO, IVF   Erythromycin base prep only - pt just finished bowel prep for colonoscopy and has been NPO  Laparoscopic assisted sigmoid colectomy tomorrow by Dr. Johnathan Hausen  External hemorrhoids Hypertension   Jill Alexanders, [facility] 12/31/2015, 10:39 AM Pager: [phone] Consults: [phone] Mon-Fri 7:00 am-4:30 pm Sat-Sun 7:00 am-11:30 am  I have seen the patient, evaluated his CT scan and discussed lap assisted sigmoid colectomy with him in detail.  Will proceed in the OR this morning with resection.    Kaylyn Lim, MD, FACS

## 2015-12-31 NOTE — Anesthesia Preprocedure Evaluation (Addendum)
Anesthesia Evaluation  Patient identified by MRN, date of birth, ID band Patient awake    Reviewed: Allergy & Precautions, NPO status , Patient's Chart, lab work & pertinent test results  Airway Mallampati: III  TM Distance: <3 FB Neck ROM: Full    Dental  (+) Teeth Intact, Dental Advisory Given   Pulmonary neg pulmonary ROS,    Pulmonary exam normal breath sounds clear to auscultation       Cardiovascular hypertension, Pt. on medications Normal cardiovascular exam Rhythm:Regular Rate:Normal     Neuro/Psych negative neurological ROS  negative psych ROS   GI/Hepatic negative GI ROS, Neg liver ROS,   Endo/Other  Obesity   Renal/GU negative Renal ROS     Musculoskeletal negative musculoskeletal ROS (+)   Abdominal   Peds  Hematology negative hematology ROS (+)   Anesthesia Other Findings Day of surgery medications reviewed with the patient.  Reproductive/Obstetrics                            Anesthesia Physical Anesthesia Plan  ASA: II  Anesthesia Plan: MAC   Post-op Pain Management:    Induction: Intravenous  Airway Management Planned: Nasal Cannula  Additional Equipment:   Intra-op Plan:   Post-operative Plan:   Informed Consent: I have reviewed the patients History and Physical, chart, labs and discussed the procedure including the risks, benefits and alternatives for the proposed anesthesia with the patient or authorized representative who has indicated his/her understanding and acceptance.   Dental advisory given  Plan Discussed with: CRNA  Anesthesia Plan Comments: (Discussed risks/benefits/alternatives to MAC sedation including need for ventilatory support, hypotension, need for conversion to general anesthesia.  All patient questions answered.  Patient/guardian wishes to proceed.)        Anesthesia Quick Evaluation

## 2015-12-31 NOTE — Progress Notes (Signed)
PROGRESS NOTE    Vincent Strickland  [id number] DOB: Apr 21, 1965 DOA: 12/30/2015 PCP: Anthoney Harada, MD   Brief Narrative:  Vincent Strickland is a 50 y.o. male with medical history significant of external Hemorrhoids, Kidney Stones, Hypertension and other comorbids who presented to [facility] with report of a few hour history of bright red blood per rectum yesterday. He reports having episodes of bright red blood per rectum on and off for a few years and it was attributed to hemorrhoids. His primary care physician was in discussion with him regarding possible early colonoscopy. Yesterday morning, however, while at work, he had 2 episodes of red watery stools. At that time, he decided to seek medical attention and come to the ED. Since being in the emergency department, he has had 3 more bright red stools. 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 Non-thrombosed External Hemorrhoids also found on perianal exam and no internal hemorrhoids. General Surgery was then consulted for evaluation of his Sigmoid Tumor. Patient to undergo CT Chest/Abd/Pelvis with Contrast today.   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 Non-thrombosed External Hemorrhoids also found on perianal exam and no internal hemorrhoids. -Surgical Pathology Results Pending -CBC showed Hb/Hct decline from 15.4/42.9 -> 13.9/39.0; Continue to Monitor CBC's -Type and Screen;  -NPO currently -CEA Level to be checked -CT Chest/Abd/Pelvis with Contrast pending to evaluate for Mets -General Surgery CCS Consulted by GI for further Recc's and Evaluation -Possible Repeat Colonoscopy recommended for Surveillance pending Pathology Results  Non-Thrombosed External Hemorrhoids -Chronic. -Not source of Lower GI Bleeding per GI -Continue to Monitor CBC's  Hypertension -Patient on home dose of Losartan/HCTZ 100-25 mg po Daily -Added Hydralazine 5 mg IV q6hprn for High Blood Pressure  Hyperlipidemia -Will Check Lipid Panel -Patient states it was Diet Controlled.  Hypokalemia -Patient's K+ was 3.0 on Admission -Replete -Repeat CMP Pending  Hx of Nephrolithiasis -Will obtain CT Abd/Pelvis with Contrast today  DVT prophylaxis: SCDs Code Status: FULL Family Communication: Discussed with Family at Bedside Disposition Plan:   Consultants:   [facility]  General Surgery  Procedures: Colonoscopy  Antimicrobials: None  Subjective: Seen and examined after colonoscopy and family was present at bedside. Stated he had some mild abdominal discomfort. No CP/SOB/N/V.   Objective: Vitals:   12/31/15 1009 12/31/15 1010 12/31/15 1020 12/31/15 1048  BP: 134/74 134/74 (!) 146/79 (!) 144/90  Pulse: 86 79 63 (!) 59  Resp: 14 15 18 20   Temp: 98.1 F (36.7 C)   97.5 F (36.4 C)  TempSrc: Oral   Oral  SpO2: 98% 97% 99% 99%  Weight:      Height:        Intake/Output Summary (Last 24  hours) at 12/31/15 1123 Last data filed at 12/31/15 0958  Gross per 24 hour  Intake             1060 ml  Output                0 ml  Net             1060 ml   Filed Weights   12/30/15 0844 12/30/15 1430  12/31/15 0853  Weight: 112.4 kg (247 lb 12.8 oz) 113.3 kg (249 lb 12.5 oz) 112 kg (247 lb)   Examination: Physical Exam:  Constitutional: WN/WD, NAD and appears anxious but comfortable Eyes: Lids and conjunctivae normal, sclerae anicteric  ENMT: External Ears, Nose appear normal. Grossly normal hearing.  Neck: Appears normal, supple, no cervical masses, normal ROM, no appreciable thyromegaly Respiratory: Clear to auscultation bilaterally, no wheezing, rales, rhonchi or crackles. Normal respiratory effort and patient is not tachypenic. No accessory muscle use.  Cardiovascular: RRR, no murmurs / rubs / gallops. S1 and S2 auscultated. No extremity edema.  Abdomen: Soft, non-tender, non-distended. No masses palpated. No appreciable hepatosplenomegaly. Bowel sounds positive.  GU: Deferred. Musculoskeletal: No clubbing / cyanosis of digits/nails. No joint deformity upper and lower extremities. No contractures. Normal strength and muscle tone.  Skin: No rashes, lesions, ulcers. No induration; Warm and dry.  Neurologic: CN 2-12 grossly intact with no focal deficits. Sensation intact in all 4 Extremities.. Romberg sign cerebellar reflexes not assessed.  Psychiatric: Normal judgment and insight. Alert and oriented x 3. Anxious mood and appropriate affect.   Data Reviewed: I have personally reviewed following labs and imaging studies  CBC:  Recent Labs Lab 12/30/15 0859 12/31/15 0228 12/31/15 0736  WBC 7.1 8.7 6.5  HGB 15.4 14.9 13.9  HCT 42.9 41.3 39.0  MCV 85.5 86.9 86.5  PLT 299 283 [id number]   Basic Metabolic Panel:  Recent Labs Lab 12/30/15 0859  NA 142  K 3.0*  CL 106  CO2 27  GLUCOSE 116*  BUN 16  CREATININE 0.96  CALCIUM 9.2   GFR: Estimated Creatinine Clearance: 121.7 mL/min (by C-G formula based on SCr of 0.96 mg/dL). Liver Function Tests:  Recent Labs Lab 12/30/15 0859  AST 27  ALT 42  ALKPHOS 70  BILITOT 1.1  PROT 7.7  ALBUMIN 4.8   No results for input(s):  LIPASE, AMYLASE in the last 168 hours. No results for input(s): AMMONIA in the last 168 hours. Coagulation Profile: No results for input(s): INR, PROTIME in the last 168 hours. Cardiac Enzymes: No results for input(s): CKTOTAL, CKMB, CKMBINDEX, TROPONINI in the last 168 hours. BNP (last 3 results) No results for input(s): PROBNP in the last 8760 hours. HbA1C: No results for input(s): HGBA1C in the last 72 hours. CBG: No results for input(s): GLUCAP in the last 168 hours. Lipid Profile: No results for input(s): CHOL, HDL, LDLCALC, TRIG, CHOLHDL, LDLDIRECT in the last 72 hours. Thyroid Function Tests: No results for input(s): TSH, T4TOTAL, FREET4, T3FREE, THYROIDAB in the last 72 hours. Anemia Panel: No results for input(s): VITAMINB12, FOLATE, FERRITIN, TIBC, IRON, RETICCTPCT in the last 72 hours. Sepsis Labs: No results for input(s): PROCALCITON, LATICACIDVEN in the last 168 hours.  No results found for this or any previous visit (from the past 240 hour(s)).   Radiology Studies: No results found.  COLONOSCOPY       - Non-thrombosed external hemorrhoids found on           perianal exam. Small. Not source of  bleeding. No           internal hemorrhoids.       - One 8 mm polyp in the sigmoid colon, removed with           a hot snare. Resected and retrieved.       - Likely malignant partially obstructing tumor in           the sigmoid colon. Biopsied. Tattooed.  Scheduled Meds: . losartan  100 mg Oral Daily   And  . hydrochlorothiazide  25 mg Oral Daily  . potassium chloride SA  40 mEq Oral BID   Continuous Infusions:   LOS: 0 days   Kerney Elbe, DO [facility] Pager [phone]  If 7PM-7AM, please contact night-coverage [email or web address] Password [email or web address] 12/31/2015, 11:23 AM

## 2015-12-31 NOTE — Op Note (Signed)
[facility] Patient Name: Vincent Strickland Procedure Date: 12/31/2015 MRN: [id number] Attending MD: Arta Silence , MD Date of Birth: 1965/07/27 CSN: [id number] Age: 50 Admit Type: Outpatient Procedure:                Colonoscopy Indications:              This is the patient's first colonoscopy,                            Hematochezia Providers:                Arta Silence, MD, Zenon Mayo, RN, Despina Pole                            Tech, Technician, Glenis Smoker, CRNA Referring MD:             [facility] Medicines:                Monitored Anesthesia Care Complications:            No immediate complications. Estimated Blood Loss:     Estimated blood loss was minimal. Estimated blood                            loss was minimal. Procedure:                Pre-Anesthesia Assessment:                           - Prior to the procedure, a History and Physical                            was performed, and patient medications and                            allergies were reviewed. The patient's tolerance of                            previous anesthesia was also reviewed. The risks                            and benefits of the procedure and the sedation                            options and risks were discussed with the patient.                            All questions were answered, and informed consent                            was obtained. Prior Anticoagulants: The patient has                            taken no previous anticoagulant or antiplatelet                            agents. ASA Grade  Assessment: II - A patient with                            mild systemic disease. After reviewing the risks                            and benefits, the patient was deemed in                            satisfactory condition to undergo the procedure.                           After obtaining informed consent, the colonoscope                            was passed  under direct vision. Throughout the                            procedure, the patient's blood pressure, pulse, and                            oxygen saturations were monitored continuously. The                            [id number] scope was introduced through                            the anus and advanced to the the cecum, identified                            by appendiceal orifice and ileocecal valve. The                            ileocecal valve, appendiceal orifice, and rectum                            were photographed. The entire colon was examined.                            The colonoscopy was performed without difficulty.                            The patient tolerated the procedure well. The                            quality of the bowel preparation was good. Scope In: 9:29:50 AM Scope Out: 9:58:40 AM Scope Withdrawal Time: 0 hours 20 minutes 59 seconds  Total Procedure Duration: 0 hours 28 minutes 50 seconds  Findings:      The perianal exam findings include non-thrombosed external hemorrhoids.      The retroflexed view of the distal rectum and anal verge was normal and       showed no anal or rectal abnormalities.      A 8 mm polyp was found in the sigmoid colon. The  polyp was sessile. The       polyp was removed with a hot snare. Resection and retrieval were       complete.      A fungating, infiltrative, sessile and ulcerated partially obstructing       large mass was found in the sigmoid colon. The mass was partially       circumferential (involving two-thirds of the lumen circumference).       Oozing was present. This was biopsied with a cold forceps for histology.       Area was tattooed with an injection of Spot (carbon black).      Colon otherwise normal; no other polyps, masses, vascular ectasias, or       inflammatory changes were seen. Impression:               - Non-thrombosed external hemorrhoids found on                            perianal exam.  Small. Not source of bleeding. No                            internal hemorrhoids.                           - One 8 mm polyp in the sigmoid colon, removed with                            a hot snare. Resected and retrieved.                           - Likely malignant partially obstructing tumor in                            the sigmoid colon. Biopsied. Tattooed. Moderate Sedation:      None Recommendation:           - Return patient to hospital ward for ongoing care.                           - Clear liquid diet today.                           - Continue present medications.                           - Await pathology results.                           - Perform a CT scan (computed tomography) of chest                            with contrast, abdomen with contrast and pelvis                            with contrast at appointment to be scheduled.                           -  Check CEA today.                           - Refer to a surgeon at appointment to be scheduled.                           [facility] will follow.                           - Repeat colonoscopy is recommended for                            surveillance. The colonoscopy date will be                            determined after pathology results from today's                            exam become available for review. Procedure Code(s):        --- Professional ---                           [phone], Colonoscopy, flexible; with removal of                            tumor(s), polyp(s), or other lesion(s) by snare                            technique                           45381, Colonoscopy, flexible; with directed                            submucosal injection(s), any substance                           [id number], 59, Colonoscopy, flexible; with biopsy,                            single or multiple Diagnosis Code(s):        --- Professional ---                           D12.5, Benign neoplasm of sigmoid colon                            D49.0, Neoplasm of unspecified behavior of                            digestive system                           K64.4, Residual hemorrhoidal skin tags                           K92.1, Melena (includes Hematochezia) CPT copyright 2016  American Medical Association. All rights reserved. The codes documented in this report are preliminary and upon coder review may  be revised to meet current compliance requirements. Arta Silence, MD 12/31/2015 10:05:44 AM This report has been signed electronically. Number of Addenda: 0

## 2015-12-31 NOTE — Anesthesia Postprocedure Evaluation (Signed)
Anesthesia Post Note  Patient: Vincent Strickland  Procedure(s) Performed: Procedure(s) (LRB): COLONOSCOPY WITH PROPOFOL (Left)  Patient location during evaluation: Endoscopy Anesthesia Type: MAC Level of consciousness: awake and alert Pain management: pain level controlled Vital Signs Assessment: post-procedure vital signs reviewed and stable Respiratory status: spontaneous breathing, nonlabored ventilation, respiratory function stable and patient connected to nasal cannula oxygen Cardiovascular status: stable and blood pressure returned to baseline Anesthetic complications: no    Last Vitals:  Vitals:   12/31/15 1020 12/31/15 1048  BP: (!) 146/79 (!) 144/90  Pulse: 63 (!) 59  Resp: 18 20  Temp:  36.4 C    Last Pain:  Vitals:   12/31/15 1048  TempSrc: Oral  PainSc:                  Catalina Gravel

## 2015-12-31 NOTE — Transfer of Care (Signed)
Immediate Anesthesia Transfer of Care Note  Patient: Vincent Strickland  Procedure(s) Performed: Procedure(s): COLONOSCOPY WITH PROPOFOL (Left)  Patient Location: PACU and Endoscopy Unit  Anesthesia Type:MAC  Level of Consciousness: awake and alert   Airway & Oxygen Therapy: Patient Spontanous Breathing and Patient connected to face mask oxygen  Post-op Assessment: Report given to RN and Post -op Vital signs reviewed and stable  Post vital signs: Reviewed and stable  Last Vitals:  Vitals:   12/31/15 0543 12/31/15 0853  BP: 128/89 (!) 171/109  Pulse: 72 73  Resp: 18 14  Temp: 36.6 C 36.6 C    Last Pain:  Vitals:   12/31/15 0853  TempSrc: Oral  PainSc:          Complications: No apparent anesthesia complications

## 2015-12-31 NOTE — Interval H&P Note (Signed)
History and Physical Interval Note:  12/31/2015 9:10 AM  Vincent Strickland  has presented today for surgery, with the diagnosis of hematochezia  The various methods of treatment have been discussed with the patient and family. After consideration of risks, benefits and other options for treatment, the patient has consented to  Procedure(s): COLONOSCOPY WITH PROPOFOL (Left) as a surgical intervention .  The patient's history has been reviewed, patient examined, no change in status, stable for surgery.  I have reviewed the patient's chart and labs.  Questions were answered to the patient's satisfaction.     Landry Dyke

## 2015-12-31 NOTE — H&P (View-Only) (Signed)
[facility] Consultation Note  Referring Provider: [facility] Primary Care Physician:  Anthoney Harada, MD Primary Gastroenterologist:  None  Reason for Consultation:  Hematochezia  HPI: Vincent Strickland is a 50 y.o. male whom I've been asked to see for evaluation of hematochezia.  For several years, since early adulthood, patient has had intermittent bouts of hematochezia.  Few months ago, began having intermittent crampy lower abdominal pain.  Large amount of hematochezia today, necessitating ED visit.  Mild lower abdominal pain.  No constipation or straining.  No weight loss.  No melena or weight loss.  Occasional NSAIDs for headaches.  No prior colonoscopy.  No family history of inflammatory bowel disease, colon cancer, colon polyps in first-degree relatives.  Hgb on arrival 15.   Past Medical History:  Diagnosis Date  . Hemorrhoid   . High cholesterol   . Hypertension   . Hypogonadism male   . Renal disorder    kidney stones    Past Surgical History:  Procedure Laterality Date  . LITHOTRIPSY    . TONSILLECTOMY      Prior to Admission medications   Medication Sig Start Date End Date Taking? Authorizing Provider  losartan-hydrochlorothiazide (HYZAAR) 100-25 MG tablet Take 1 tablet by mouth daily. 08/19/15  Yes Susy Frizzle, MD  potassium chloride SA (K-DUR,KLOR-CON) 20 MEQ tablet Take 1 tablet (20 mEq total) by mouth daily. 06/20/15  Yes Susy Frizzle, MD  potassium chloride (K-DUR) 10 MEQ tablet Take 1 tablet (10 mEq total) by mouth daily x 5days Patient not taking: Reported on 12/30/2015 05/08/15   Susy Frizzle, MD  pravastatin (PRAVACHOL) 20 MG tablet TAKE 1 TABLET (20 MG TOTAL) BY MOUTH DAILY. Patient not taking: Reported on 12/30/2015 11/05/15   Susy Frizzle, MD    No current facility-administered medications for this encounter.    Current Outpatient Prescriptions  Medication Sig Dispense Refill  . losartan-hydrochlorothiazide (HYZAAR)  100-25 MG tablet Take 1 tablet by mouth daily. 90 tablet 3  . potassium chloride SA (K-DUR,KLOR-CON) 20 MEQ tablet Take 1 tablet (20 mEq total) by mouth daily. 90 tablet 3  . potassium chloride (K-DUR) 10 MEQ tablet Take 1 tablet (10 mEq total) by mouth daily x 5days (Patient not taking: Reported on 12/30/2015) 5 tablet 0  . pravastatin (PRAVACHOL) 20 MG tablet TAKE 1 TABLET (20 MG TOTAL) BY MOUTH DAILY. (Patient not taking: Reported on 12/30/2015) 90 tablet 1    Allergies as of 12/30/2015 - Review Complete 12/30/2015  Allergen Reaction Noted  . Niaspan [niacin]  10/04/2012  . Percocet [oxycodone-acetaminophen] Nausea Only 10/04/2012    Family History  Problem Relation Age of Onset  . Hypertension Mother   . Cancer Father     prostate  . Hypertension Maternal Grandmother   . Diabetes Paternal Grandfather   . COPD Paternal Grandfather     Social History   Social History  . Marital status: Married    Spouse name: N/A  . Number of children: N/A  . Years of education: N/A   Occupational History  . Not on file.   Social History Main Topics  . Smoking status: Never Smoker  . Smokeless tobacco: Never Used  . Alcohol use No  . Drug use: No  . Sexual activity: Not on file   Other Topics Concern  . Not on file   Social History Narrative  . No narrative on file    Review of Systems: Positive = bold Gen: Denies any fever, chills, rigors, night sweats,  anorexia, fatigue, weakness, malaise, involuntary weight loss, and sleep disorder CV: Denies chest pain, angina, palpitations, syncope, orthopnea, PND, peripheral edema, and claudication. Resp: Denies dyspnea, cough, sputum, wheezing, coughing up blood. GI: Described in detail in HPI.    GU : Denies urinary burning, blood in urine, urinary frequency, urinary hesitancy, nocturnal urination, and urinary incontinence. MS: Denies joint pain or swelling.  Denies muscle weakness, cramps, atrophy.  Derm: Denies rash, itching, oral  ulcerations, hives, unhealing ulcers.  Psych: Denies depression, anxiety, memory loss, suicidal ideation, hallucinations,  and confusion. Heme: Denies bruising, bleeding, and enlarged lymph nodes. Neuro:  Denies any headaches, dizziness, paresthesias. Endo:  Denies any problems with DM, thyroid, adrenal function.  Physical Exam: Vital signs in last 24 hours: Temp:  [98.4 F (36.9 C)] 98.4 F (36.9 C) (12/05 0840) Pulse Rate:  [80-84] 80 (12/05 1230) Resp:  [16-18] 16 (12/05 1230) BP: (133-138)/(95-112) 133/95 (12/05 1230) SpO2:  [98 %] 98 % (12/05 1230) Weight:  [110.2 kg (243 lb)-112.4 kg (247 lb 12.8 oz)] 112.4 kg (247 lb 12.8 oz) (12/05 0844)   General:   Alert,  Well-developed, well-nourished, pleasant and cooperative in NAD Head:  Normocephalic and atraumatic. Eyes:  Sclera clear, no icterus.   Conjunctiva pink. Ears:  Normal auditory acuity. Nose:  No deformity, discharge,  or lesions. Mouth:  No deformity or lesions.  Oropharynx pink & moist. Neck:  Supple; no masses or thyromegaly. Lungs:  Clear throughout to auscultation.   No wheezes, crackles, or rhonchi. No acute distress. Heart:  Regular rate and rhythm; no murmurs, clicks, rubs,  or gallops. Abdomen:  Soft, protuberant, nontender and nondistended. No masses, hepatosplenomegaly or hernias noted. Normal bowel sounds, without guarding, and without rebound.     Msk:  Symmetrical without gross deformities. Normal posture. Pulses:  Normal pulses noted. Extremities:  Without clubbing or edema. Neurologic:  Alert and  oriented x4;  grossly normal neurologically. Skin:  Intact without significant lesions or rashes. Psych:  Alert and cooperative. Normal mood and affect.   Lab Results:  Recent Labs  12/30/15 0859  WBC 7.1  HGB 15.4  HCT 42.9  PLT 299   BMET  Recent Labs  12/30/15 0859  NA 142  K 3.0*  CL 106  CO2 27  GLUCOSE 116*  BUN 16  CREATININE 0.96  CALCIUM 9.2   LFT  Recent Labs  12/30/15 0859   PROT 7.7  ALBUMIN 4.8  AST 27  ALT 42  ALKPHOS 70  BILITOT 1.1   PT/INR No results for input(s): LABPROT, INR in the last 72 hours.  Studies/Results: No results found.  Impression:  1.  Hematochezia, non-destabilizing. 2.  Hemorrhoids.  Plan:  1.  Clear liquids. 2.  Daily CBCs should suffice. 3.  Colonoscopy tomorrow for further evaluation. 4.  Risks (bleeding, infection, bowel perforation that could require surgery, sedation-related changes in cardiopulmonary systems), benefits (identification and possible treatment of source of symptoms, exclusion of certain causes of symptoms), and alternatives (watchful waiting, radiographic imaging studies, empiric medical treatment) of colonoscopy were explained to patient/family in detail and patient wishes to proceed.   LOS: 0 days   Flemon Kelty M  12/30/2015, 12:55 PM  Pager [phone] If no answer or after 5 PM call [phone]

## 2016-01-01 ENCOUNTER — Encounter (HOSPITAL_COMMUNITY): Payer: Self-pay | Admitting: Certified Registered Nurse Anesthetist

## 2016-01-01 ENCOUNTER — Inpatient Hospital Stay (HOSPITAL_COMMUNITY): Payer: BLUE CROSS/BLUE SHIELD | Admitting: Certified Registered Nurse Anesthetist

## 2016-01-01 ENCOUNTER — Encounter (HOSPITAL_COMMUNITY)
Admission: EM | Disposition: A | Discharge status: Home / Self Care | Payer: Self-pay | Source: Home / Self Care | Attending: Internal Medicine

## 2016-01-01 HISTORY — PX: LAPAROSCOPIC SIGMOID COLECTOMY: SHX5928

## 2016-01-01 LAB — CBC WITH DIFFERENTIAL/PLATELET
BASOS PCT: 1 %
Basophils Absolute: 0.1 10*3/uL (ref 0.0–0.1)
EOS ABS: 0.2 10*3/uL (ref 0.0–0.7)
Eosinophils Relative: 2 %
HCT: 36.3 % — ABNORMAL LOW (ref 39.0–52.0)
Hemoglobin: 12.9 g/dL — ABNORMAL LOW (ref 13.0–17.0)
Lymphocytes Relative: 34 %
Lymphs Abs: 2.7 10*3/uL (ref 0.7–4.0)
MCH: 30.6 pg (ref 26.0–34.0)
MCHC: 35.5 g/dL (ref 30.0–36.0)
MCV: 86 fL (ref 78.0–100.0)
MONO ABS: 0.6 10*3/uL (ref 0.1–1.0)
MONOS PCT: 7 %
NEUTROS PCT: 56 %
Neutro Abs: 4.4 10*3/uL (ref 1.7–7.7)
Platelets: 261 10*3/uL (ref 150–400)
RBC: 4.22 MIL/uL (ref 4.22–5.81)
RDW: 13.4 % (ref 11.5–15.5)
WBC: 7.9 10*3/uL (ref 4.0–10.5)

## 2016-01-01 LAB — COMPREHENSIVE METABOLIC PANEL
ALBUMIN: 4 g/dL (ref 3.5–5.0)
ALT: 42 U/L (ref 17–63)
ANION GAP: 7 (ref 5–15)
AST: 26 U/L (ref 15–41)
Alkaline Phosphatase: 60 U/L (ref 38–126)
BUN: 13 mg/dL (ref 6–20)
CHLORIDE: 106 mmol/L (ref 101–111)
CO2: 27 mmol/L (ref 22–32)
Calcium: 8.5 mg/dL — ABNORMAL LOW (ref 8.9–10.3)
Creatinine, Ser: 0.87 mg/dL (ref 0.61–1.24)
GFR calc Af Amer: >60 mL/min (ref >60)
GFR calc non Af Amer: >60 mL/min (ref >60)
GLUCOSE: 104 mg/dL — AB (ref 65–99)
POTASSIUM: 2.8 mmol/L — AB (ref 3.5–5.1)
SODIUM: 140 mmol/L (ref 135–145)
TOTAL PROTEIN: 6.5 g/dL (ref 6.5–8.1)
Total Bilirubin: 1.2 mg/dL (ref 0.3–1.2)

## 2016-01-01 LAB — MAGNESIUM: Magnesium: 2.1 mg/dL (ref 1.7–2.4)

## 2016-01-01 LAB — SURGICAL PCR SCREEN
MRSA, PCR: NEGATIVE
Staphylococcus aureus: POSITIVE — AB

## 2016-01-01 LAB — LIPID PANEL
CHOL/HDL RATIO: 8.3 ratio
CHOLESTEROL: 198 mg/dL (ref 0–200)
HDL: 24 mg/dL — ABNORMAL LOW (ref >40)
LDL Cholesterol: 143 mg/dL — ABNORMAL HIGH (ref 0–99)
TRIGLYCERIDES: 156 mg/dL — AB (ref <150)
VLDL: 31 mg/dL (ref 0–40)

## 2016-01-01 LAB — PHOSPHORUS: Phosphorus: 3.6 mg/dL (ref 2.5–4.6)

## 2016-01-01 SURGERY — COLECTOMY, SIGMOID, LAPAROSCOPIC
Anesthesia: General | Site: Abdomen

## 2016-01-01 MED ORDER — FENTANYL CITRATE (PF) 100 MCG/2ML IJ SOLN
12.5000 ug | INTRAMUSCULAR | Status: DC | PRN
  Administered 2016-01-01 – 2016-01-02 (×4): 12.5 ug via INTRAVENOUS
  Filled 2016-01-01 (×4): qty 2

## 2016-01-01 MED ORDER — KETOROLAC TROMETHAMINE 30 MG/ML IJ SOLN: 30.0000 mg | Freq: Once | INTRAMUSCULAR | Status: DC

## 2016-01-01 MED ORDER — LIDOCAINE 2% (20 MG/ML) 5 ML SYRINGE
INTRAMUSCULAR | Status: DC | PRN
  Administered 2016-01-01: 100 mg via INTRAVENOUS

## 2016-01-01 MED ORDER — PROMETHAZINE HCL 25 MG/ML IJ SOLN
INTRAMUSCULAR | Status: AC
  Administered 2016-01-01: 12.5 mg via INTRAVENOUS
  Filled 2016-01-01: qty 1

## 2016-01-01 MED ORDER — BUPIVACAINE LIPOSOME 1.3 % IJ SUSP
20.0000 mL | Freq: Once | INTRAMUSCULAR | Status: AC
  Administered 2016-01-01: 20 mL
  Filled 2016-01-01: qty 20

## 2016-01-01 MED ORDER — ONDANSETRON HCL 4 MG/2ML IJ SOLN: 4.0000 mg | Freq: Four times a day (QID) | INTRAMUSCULAR | Status: DC | PRN

## 2016-01-01 MED ORDER — DEXAMETHASONE SODIUM PHOSPHATE 10 MG/ML IJ SOLN
INTRAMUSCULAR | Status: DC | PRN
  Administered 2016-01-01: 10 mg via INTRAVENOUS

## 2016-01-01 MED ORDER — FENTANYL CITRATE (PF) 100 MCG/2ML IJ SOLN
INTRAMUSCULAR | Status: AC
  Filled 2016-01-01: qty 2

## 2016-01-01 MED ORDER — SACCHAROMYCES BOULARDII 250 MG PO CAPS
250.0000 mg | ORAL_CAPSULE | Freq: Two times a day (BID) | ORAL | Status: DC
  Administered 2016-01-01 – 2016-01-05 (×8): 250 mg via ORAL
  Filled 2016-01-01 (×9): qty 1

## 2016-01-01 MED ORDER — PROMETHAZINE HCL 25 MG/ML IJ SOLN
6.2500 mg | INTRAMUSCULAR | Status: AC | PRN
  Administered 2016-01-01 (×2): 12.5 mg via INTRAVENOUS

## 2016-01-01 MED ORDER — LACTATED RINGERS IV SOLN
INTRAVENOUS | Status: DC
  Administered 2016-01-01: 1000 mL via INTRAVENOUS

## 2016-01-01 MED ORDER — CHLORHEXIDINE GLUCONATE CLOTH 2 % EX PADS
6.0000 | MEDICATED_PAD | Freq: Every day | CUTANEOUS | Status: DC
  Administered 2016-01-01: 6 via TOPICAL

## 2016-01-01 MED ORDER — MUPIROCIN 2 % EX OINT
1.0000 "application " | TOPICAL_OINTMENT | Freq: Two times a day (BID) | CUTANEOUS | Status: DC
  Administered 2016-01-01: 1 via NASAL
  Filled 2016-01-01: qty 22

## 2016-01-01 MED ORDER — DEXTROSE 5 % IV SOLN
2.0000 g | Freq: Two times a day (BID) | INTRAVENOUS | Status: AC
  Administered 2016-01-01: 2 g via INTRAVENOUS
  Filled 2016-01-01: qty 2

## 2016-01-01 MED ORDER — LACTATED RINGERS IR SOLN
Status: DC | PRN
  Administered 2016-01-01: 3000 mL

## 2016-01-01 MED ORDER — HEPARIN SODIUM (PORCINE) 5000 UNIT/ML IJ SOLN
5000.0000 [IU] | Freq: Three times a day (TID) | INTRAMUSCULAR | Status: DC
  Administered 2016-01-01 – 2016-01-05 (×11): 5000 [IU] via SUBCUTANEOUS
  Filled 2016-01-01 (×11): qty 1

## 2016-01-01 MED ORDER — DEXAMETHASONE SODIUM PHOSPHATE 10 MG/ML IJ SOLN
INTRAMUSCULAR | Status: AC
  Filled 2016-01-01: qty 1

## 2016-01-01 MED ORDER — KCL IN DEXTROSE-NACL 20-5-0.45 MEQ/L-%-% IV SOLN
INTRAVENOUS | Status: DC
  Administered 2016-01-01 – 2016-01-02 (×2): via INTRAVENOUS
  Filled 2016-01-01 (×3): qty 1000

## 2016-01-01 MED ORDER — HYDROCODONE-ACETAMINOPHEN 5-325 MG PO TABS
1.0000 | ORAL_TABLET | ORAL | Status: DC | PRN
Start: 2016-01-01 — End: 2016-01-02
  Administered 2016-01-02: 2 via ORAL
  Filled 2016-01-01: qty 1
  Filled 2016-01-01 (×2): qty 2

## 2016-01-01 MED ORDER — ACETAMINOPHEN 10 MG/ML IV SOLN
INTRAVENOUS | Status: AC
  Filled 2016-01-01: qty 100

## 2016-01-01 MED ORDER — PROPOFOL 10 MG/ML IV BOLUS
INTRAVENOUS | Status: AC
  Filled 2016-01-01: qty 20

## 2016-01-01 MED ORDER — LACTATED RINGERS IV SOLN
INTRAVENOUS | Status: DC | PRN
  Administered 2016-01-01 (×2): via INTRAVENOUS

## 2016-01-01 MED ORDER — EPHEDRINE SULFATE-NACL 50-0.9 MG/10ML-% IV SOSY
PREFILLED_SYRINGE | INTRAVENOUS | Status: DC | PRN
  Administered 2016-01-01: 5 mg via INTRAVENOUS
  Administered 2016-01-01: 10 mg via INTRAVENOUS

## 2016-01-01 MED ORDER — BUPIVACAINE HCL 0.25 % IJ SOLN
INTRAMUSCULAR | Status: AC
  Filled 2016-01-01: qty 1

## 2016-01-01 MED ORDER — HYDROMORPHONE HCL 2 MG/ML IJ SOLN
INTRAMUSCULAR | Status: AC
  Filled 2016-01-01: qty 1

## 2016-01-01 MED ORDER — FENTANYL CITRATE (PF) 100 MCG/2ML IJ SOLN
INTRAMUSCULAR | Status: DC | PRN
  Administered 2016-01-01 (×10): 50 ug via INTRAVENOUS

## 2016-01-01 MED ORDER — ONDANSETRON HCL 4 MG/2ML IJ SOLN
INTRAMUSCULAR | Status: DC | PRN
  Administered 2016-01-01: 4 mg via INTRAVENOUS

## 2016-01-01 MED ORDER — FENTANYL CITRATE (PF) 100 MCG/2ML IJ SOLN
INTRAMUSCULAR | Status: AC
  Filled 2016-01-01: qty 4

## 2016-01-01 MED ORDER — LIDOCAINE 2% (20 MG/ML) 5 ML SYRINGE
INTRAMUSCULAR | Status: AC
  Filled 2016-01-01: qty 5

## 2016-01-01 MED ORDER — SUGAMMADEX SODIUM 200 MG/2ML IV SOLN
INTRAVENOUS | Status: DC | PRN
  Administered 2016-01-01: 200 mg via INTRAVENOUS

## 2016-01-01 MED ORDER — HYDROMORPHONE HCL 1 MG/ML IJ SOLN
0.2500 mg | INTRAMUSCULAR | Status: DC | PRN
  Administered 2016-01-01: 0.25 mg via INTRAVENOUS

## 2016-01-01 MED ORDER — ACETAMINOPHEN 10 MG/ML IV SOLN
1000.0000 mg | Freq: Once | INTRAVENOUS | Status: AC
  Administered 2016-01-01: 1000 mg via INTRAVENOUS

## 2016-01-01 MED ORDER — ONDANSETRON HCL 4 MG PO TABS
4.0000 mg | ORAL_TABLET | Freq: Four times a day (QID) | ORAL | Status: DC | PRN
  Administered 2016-01-03: 4 mg via ORAL
  Filled 2016-01-01: qty 1

## 2016-01-01 MED ORDER — SODIUM CHLORIDE 0.9 % IJ SOLN
INTRAMUSCULAR | Status: AC
  Filled 2016-01-01: qty 50

## 2016-01-01 MED ORDER — ROCURONIUM BROMIDE 50 MG/5ML IV SOSY
PREFILLED_SYRINGE | INTRAVENOUS | Status: AC
  Filled 2016-01-01: qty 5

## 2016-01-01 MED ORDER — SUGAMMADEX SODIUM 200 MG/2ML IV SOLN
INTRAVENOUS | Status: AC
Start: 2016-01-01 — End: 2016-01-01
  Filled 2016-01-01: qty 2

## 2016-01-01 MED ORDER — ONDANSETRON HCL 4 MG/2ML IJ SOLN
INTRAMUSCULAR | Status: AC
  Filled 2016-01-01: qty 2

## 2016-01-01 MED ORDER — SODIUM CHLORIDE 0.9 % IJ SOLN
INTRAMUSCULAR | Status: DC | PRN
  Administered 2016-01-01: 10 mL

## 2016-01-01 MED ORDER — CEFOTETAN DISODIUM-DEXTROSE 2-2.08 GM-% IV SOLR
INTRAVENOUS | Status: AC
  Filled 2016-01-01: qty 50

## 2016-01-01 MED ORDER — MIDAZOLAM HCL 5 MG/5ML IJ SOLN
INTRAMUSCULAR | Status: DC | PRN
  Administered 2016-01-01: 2 mg via INTRAVENOUS

## 2016-01-01 MED ORDER — MIDAZOLAM HCL 2 MG/2ML IJ SOLN
INTRAMUSCULAR | Status: AC
  Filled 2016-01-01: qty 2

## 2016-01-01 MED ORDER — PROPOFOL 10 MG/ML IV BOLUS
INTRAVENOUS | Status: DC | PRN
  Administered 2016-01-01: 50 mg via INTRAVENOUS
  Administered 2016-01-01: 200 mg via INTRAVENOUS
  Administered 2016-01-01: 30 mg via INTRAVENOUS
  Administered 2016-01-01: 50 mg via INTRAVENOUS

## 2016-01-01 MED ORDER — ROCURONIUM BROMIDE 10 MG/ML (PF) SYRINGE
PREFILLED_SYRINGE | INTRAVENOUS | Status: DC | PRN
  Administered 2016-01-01: 20 mg via INTRAVENOUS
  Administered 2016-01-01 (×2): 10 mg via INTRAVENOUS
  Administered 2016-01-01: 5 mg via INTRAVENOUS
  Administered 2016-01-01 (×3): 10 mg via INTRAVENOUS
  Administered 2016-01-01: 50 mg via INTRAVENOUS

## 2016-01-01 MED ORDER — SODIUM CHLORIDE 0.9 % IV SOLN
30.0000 meq | Freq: Once | INTRAVENOUS | Status: DC
  Filled 2016-01-01: qty 15

## 2016-01-01 SURGICAL SUPPLY — 72 items
ADH SKN CLS APL DERMABOND .7 (GAUZE/BANDAGES/DRESSINGS) ×1
APPLIER CLIP 5 13 M/L LIGAMAX5 (MISCELLANEOUS)
APPLIER CLIP ROT 10 11.4 M/L (STAPLE)
APR CLP MED LRG 11.4X10 (STAPLE)
APR CLP MED LRG 5 ANG JAW (MISCELLANEOUS)
BLADE EXTENDED COATED 6.5IN (ELECTRODE) IMPLANT
CABLE HIGH FREQUENCY MONO STRZ (ELECTRODE) ×2 IMPLANT
CELLS DAT CNTRL 66122 CELL SVR (MISCELLANEOUS) ×1 IMPLANT
CHLORAPREP W/TINT 26ML (MISCELLANEOUS) ×2 IMPLANT
CLIP APPLIE 5 13 M/L LIGAMAX5 (MISCELLANEOUS) IMPLANT
CLIP APPLIE ROT 10 11.4 M/L (STAPLE) IMPLANT
COUNTER NEEDLE 20 DBL MAG RED (NEEDLE) ×2 IMPLANT
COVER MAYO STAND STRL (DRAPES) ×6 IMPLANT
COVER SURGICAL LIGHT HANDLE (MISCELLANEOUS) ×4 IMPLANT
DECANTER SPIKE VIAL GLASS SM (MISCELLANEOUS) ×2 IMPLANT
DERMABOND ADVANCED (GAUZE/BANDAGES/DRESSINGS) ×1
DERMABOND ADVANCED .7 DNX12 (GAUZE/BANDAGES/DRESSINGS) IMPLANT
DRAPE LAPAROSCOPIC ABDOMINAL (DRAPES) ×2 IMPLANT
DRAPE SURG IRRIG POUCH 19X23 (DRAPES) ×2 IMPLANT
DRSG OPSITE POSTOP 4X10 (GAUZE/BANDAGES/DRESSINGS) IMPLANT
DRSG OPSITE POSTOP 4X6 (GAUZE/BANDAGES/DRESSINGS) ×1 IMPLANT
DRSG OPSITE POSTOP 4X8 (GAUZE/BANDAGES/DRESSINGS) IMPLANT
ELECT COATED BLADE 2.86 ST (ELECTRODE) ×1 IMPLANT
ELECT REM PT RETURN 15FT ADLT (MISCELLANEOUS) ×2 IMPLANT
GAUZE SPONGE 4X4 12PLY STRL (GAUZE/BANDAGES/DRESSINGS) ×1 IMPLANT
GLOVE BIOGEL M 8.0 STRL (GLOVE) ×6 IMPLANT
GOWN STRL REUS W/TWL XL LVL3 (GOWN DISPOSABLE) ×12 IMPLANT
HANDLE STAPLE EGIA 4 XL (STAPLE) ×1 IMPLANT
IRRIG SUCT STRYKERFLOW 2 WTIP (MISCELLANEOUS) ×2
IRRIGATION SUCT STRKRFLW 2 WTP (MISCELLANEOUS) ×1 IMPLANT
LEGGING LITHOTOMY PAIR STRL (DRAPES) ×2 IMPLANT
LIGASURE IMPACT 36 18CM CVD LR (INSTRUMENTS) ×1 IMPLANT
MARKER SKIN DUAL TIP RULER LAB (MISCELLANEOUS) ×1 IMPLANT
PACK COLON (CUSTOM PROCEDURE TRAY) ×2 IMPLANT
PAD POSITIONING PINK XL (MISCELLANEOUS) ×2 IMPLANT
PORT LAP GEL ALEXIS MED 5-9CM (MISCELLANEOUS) IMPLANT
POSITIONER SURGICAL ARM (MISCELLANEOUS) ×4 IMPLANT
RELOAD EGIA BLACK ROTIC 45MM (STAPLE) ×2 IMPLANT
RELOAD STAPLE 45 BLK XTHK (STAPLE) IMPLANT
RELOAD STAPLE 60 BLK XTHK ART (STAPLE) IMPLANT
RELOAD TRI 2.0 60 XTHK VAS SUL (STAPLE) ×4 IMPLANT
RETRACTOR WND ALEXIS 18 MED (MISCELLANEOUS) IMPLANT
RTRCTR WOUND ALEXIS 18CM MED (MISCELLANEOUS) ×2
SCISSORS LAP 5X45 EPIX DISP (ENDOMECHANICALS) ×2 IMPLANT
SEALER TISSUE X1 CVD JAW (INSTRUMENTS) IMPLANT
SET IRRIG TUBING LAPAROSCOPIC (IRRIGATION / IRRIGATOR) ×1 IMPLANT
SHEARS CURVED HARMONIC AC 45CM (MISCELLANEOUS) ×2 IMPLANT
SLEEVE XCEL OPT CAN 5 100 (ENDOMECHANICALS) ×5 IMPLANT
STAPLER CIRC ILS CVD 33MM 37CM (STAPLE) ×1 IMPLANT
STAPLER CUT CVD 40MM GREEN (STAPLE) ×1 IMPLANT
STAPLER VISISTAT 35W (STAPLE) ×3 IMPLANT
SUT CHROMIC 3 0 SH 27 (SUTURE) IMPLANT
SUT PDS AB 1 CTX 36 (SUTURE) IMPLANT
SUT PDS AB 1 TP1 96 (SUTURE) IMPLANT
SUT PDS AB 4-0 SH 27 (SUTURE) IMPLANT
SUT PROLENE 2 0 KS (SUTURE) IMPLANT
SUT SILK 2 0 (SUTURE) ×2
SUT SILK 2 0 SH CR/8 (SUTURE) ×2 IMPLANT
SUT SILK 2-0 18XBRD TIE 12 (SUTURE) ×1 IMPLANT
SUT SILK 3 0 (SUTURE) ×2
SUT SILK 3 0 SH CR/8 (SUTURE) ×2 IMPLANT
SUT SILK 3-0 18XBRD TIE 12 (SUTURE) ×1 IMPLANT
SUT VIC AB 4-0 SH 18 (SUTURE) ×2 IMPLANT
SYS LAPSCP GELPORT 120MM (MISCELLANEOUS)
SYSTEM LAPSCP GELPORT 120MM (MISCELLANEOUS) IMPLANT
TOWEL OR 17X26 10 PK STRL BLUE (TOWEL DISPOSABLE) ×1 IMPLANT
TOWEL OR NON WOVEN STRL DISP B (DISPOSABLE) ×2 IMPLANT
TRAY FOLEY W/METER SILVER 16FR (SET/KITS/TRAYS/PACK) ×1 IMPLANT
TROCAR BLADELESS OPT 5 100 (ENDOMECHANICALS) ×2 IMPLANT
TROCAR XCEL NON-BLD 11X100MML (ENDOMECHANICALS) IMPLANT
TUBING CONNECTING 10 (TUBING) IMPLANT
TUBING INSUF HEATED (TUBING) ×2 IMPLANT

## 2016-01-01 NOTE — Interval H&P Note (Signed)
History and Physical Interval Note:  01/01/2016 9:58 AM  Vincent Strickland  has presented today for surgery, with the diagnosis of Sigmoid Colon Mass  The various methods of treatment have been discussed with the patient and family. After consideration of risks, benefits and other options for treatment, the patient has consented to  Procedure(s): LAPAROSCOPIC ASSISTED SIGMOID COLECTOMY (N/A) as a surgical intervention .  The patient's history has been reviewed, patient examined, no change in status, stable for surgery.  I have reviewed the patient's chart and labs.  Questions were answered to the patient's satisfaction.     Alexie Samson B

## 2016-01-01 NOTE — Anesthesia Preprocedure Evaluation (Signed)
Anesthesia Evaluation  Patient identified by MRN, date of birth, ID band Patient awake    Reviewed: Allergy & Precautions, NPO status , Patient's Chart, lab work & pertinent test results  Airway Mallampati: I  TM Distance: >3 FB Neck ROM: Full    Dental  (+) Teeth Intact, Dental Advisory Given, Chipped   Pulmonary neg pulmonary ROS,    breath sounds clear to auscultation       Cardiovascular hypertension,  Rhythm:Regular Rate:Normal     Neuro/Psych negative neurological ROS     GI/Hepatic Per surgeon    Endo/Other    Renal/GU Renal InsufficiencyRenal disease     Musculoskeletal   Abdominal   Peds  Hematology negative hematology ROS (+)   Anesthesia Other Findings   Reproductive/Obstetrics                             Anesthesia Physical Anesthesia Plan  ASA: II  Anesthesia Plan: General   Post-op Pain Management:    Induction: Intravenous  Airway Management Planned: Oral ETT  Additional Equipment:   Intra-op Plan:   Post-operative Plan: Extubation in OR  Informed Consent: I have reviewed the patients History and Physical, chart, labs and discussed the procedure including the risks, benefits and alternatives for the proposed anesthesia with the patient or authorized representative who has indicated his/her understanding and acceptance.   Dental advisory given  Plan Discussed with:   Anesthesia Plan Comments:         Anesthesia Quick Evaluation

## 2016-01-01 NOTE — Anesthesia Procedure Notes (Signed)
Procedure Name: Intubation Date/Time: 01/01/2016 10:38 AM Performed by: Montel Clock Pre-anesthesia Checklist: Patient identified, Emergency Drugs available, Suction available, Patient being monitored and Timeout performed Patient Re-evaluated:Patient Re-evaluated prior to inductionOxygen Delivery Method: Circle system utilized Preoxygenation: Pre-oxygenation with 100% oxygen Intubation Type: IV induction Ventilation: Mask ventilation without difficulty and Oral airway inserted - appropriate to patient size Laryngoscope Size: 3 and Miller Grade View: Grade I Tube type: Oral Tube size: 7.5 mm Number of attempts: 1 Airway Equipment and Method: Stylet Placement Confirmation: ETT inserted through vocal cords under direct vision,  positive ETCO2 and breath sounds checked- equal and bilateral Secured at: 23 cm Tube secured with: Tape Dental Injury: Teeth and Oropharynx as per pre-operative assessment  Comments: Intubation by [facility]

## 2016-01-01 NOTE — H&P (View-Only) (Signed)
[facility] Consult Note  Vincent Strickland 12/11/1965  [id number].    Requesting MD: Outlaw, MD Chief Complaint/Reason for Consult: Sigmoid mass with concern for colon cancer HPI:  50 year-old male with a h/o HTN, external hemorrhoids, and nephrolithiasis who presented to [facility] for evaluation of hematochezia. He has had intermittent mild rectal bleeding for 2-3 years which he attributed to hemorrhoids. Larger volume hematochezia brought him to the emergency department for evaluation yesterday - he has never had large volume, frank blood per rectum before yesterday. Associated symptoms include intermittent, dull lower abdominal pain, rated 1/10. He denies fever, chills, decreased appetite, constipation, melena, or weight loss. Colonoscopy performed my Dr. 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 General surgery has been asked to consult. CEA pending. CT scan chest, abdomen, pelvis being performed today. Patient has no known family history of colon cancer.   ROS: Review of Systems  Constitutional: Negative for chills, fever, malaise/fatigue and weight loss.  Eyes: Negative for double vision.  Respiratory: Negative for shortness of breath.   Cardiovascular: Negative for chest pain.  Gastrointestinal: Positive for abdominal pain and blood in stool. Negative for constipation, diarrhea, nausea and vomiting.  Genitourinary: Negative for dysuria.  Neurological: Positive for headaches. Negative for dizziness.     Family History  Problem Relation Age of Onset  . Hypertension Mother   . Cancer Father     prostate  . Migraines Father   . Hypertension Maternal Grandmother   . Diabetes Paternal Grandfather   . COPD Paternal Grandfather     Past Medical History:  Diagnosis Date  . Hemorrhoid   . High  cholesterol   . Hypertension   . Hypogonadism male   . Renal disorder    kidney stones    Past Surgical History:  Procedure Laterality Date  . LITHOTRIPSY    . TONSILLECTOMY      Social History:  reports that he has never smoked. He has never used smokeless tobacco. He reports that he does not drink alcohol or use drugs.  Allergies:  Allergies  Allergen Reactions  . Niaspan [Niacin]     Severe flushing  . Percocet [Oxycodone-Acetaminophen] Nausea Only    hallucinations    Medications Prior to Admission  Medication Sig Dispense Refill  . losartan-hydrochlorothiazide (HYZAAR) 100-25 MG tablet Take 1 tablet by mouth daily. 90 tablet 3  . potassium chloride SA (K-DUR,KLOR-CON) 20 MEQ tablet Take 1 tablet (20 mEq total) by mouth daily. 90 tablet 3  . potassium chloride (K-DUR) 10 MEQ tablet Take 1 tablet (10 mEq total) by mouth daily x 5days (Patient not taking: Reported on 12/30/2015) 5 tablet 0  . pravastatin (PRAVACHOL) 20 MG tablet TAKE 1 TABLET (20 MG TOTAL) BY MOUTH DAILY. (Patient not taking: Reported on 12/30/2015) 90 tablet 1    Blood pressure (!) 146/79, pulse 63, temperature 98.1 F (36.7 C), temperature source Oral, resp. rate 18, height 6' (1.829 m), weight 247 lb (112 kg), SpO2 99 %. Physical Exam: General: pleasant white male who is laying in bed in NAD HEENT: head is normocephalic, atraumatic. Mouth is pink and moist Heart: regular, rate, and rhythm.  No obvious murmurs, gallops, or rubs noted.  Palpable pedal pulses bilaterally Lungs: CTAB, no wheezes, rhonchi, or rales noted.  Respiratory effort nonlabored Abd: soft, NT/ND, +BS, no masses,   hernias, or organomegaly MS: all 4 extremities are symmetrical with no cyanosis, clubbing, or edema. Skin: warm and dry with no masses, lesions, or rashes Psych: A&Ox3 with an appropriate affect. Neuro: grossly intact, normal speech  Results for orders placed or performed during the hospital encounter of 12/30/15 (from the  past 48 hour(s))  ABO/Rh     Status: None   Collection Time: 12/30/15  8:56 AM  Result Value Ref Range   ABO/RH(D) A POS   Comprehensive metabolic panel     Status: Abnormal   Collection Time: 12/30/15  8:59 AM  Result Value Ref Range   Sodium 142 135 - 145 mmol/L   Potassium 3.0 (L) 3.5 - 5.1 mmol/L   Chloride 106 101 - 111 mmol/L   CO2 27 22 - 32 mmol/L   Glucose, Bld 116 (H) 65 - 99 mg/dL   BUN 16 6 - 20 mg/dL   Creatinine, Ser 0.96 0.61 - 1.24 mg/dL   Calcium 9.2 8.9 - 10.3 mg/dL   Total Protein 7.7 6.5 - 8.1 g/dL   Albumin 4.8 3.5 - 5.0 g/dL   AST 27 15 - 41 U/L   ALT 42 17 - 63 U/L   Alkaline Phosphatase 70 38 - 126 U/L   Total Bilirubin 1.1 0.3 - 1.2 mg/dL   GFR calc non Af Amer >60 >60 mL/min   GFR calc Af Amer >60 >60 mL/min    Comment: (NOTE) The eGFR has been calculated using the CKD EPI equation. This calculation has not been validated in all clinical situations. eGFR's persistently <60 mL/min signify possible Chronic Kidney Disease.    Anion gap 9 5 - 15  CBC     Status: None   Collection Time: 12/30/15  8:59 AM  Result Value Ref Range   WBC 7.1 4.0 - 10.5 K/uL   RBC 5.02 4.22 - 5.81 MIL/uL   Hemoglobin 15.4 13.0 - 17.0 g/dL   HCT 42.9 39.0 - 52.0 %   MCV 85.5 78.0 - 100.0 fL   MCH 30.7 26.0 - 34.0 pg   MCHC 35.9 30.0 - 36.0 g/dL   RDW 13.5 11.5 - 15.5 %   Platelets 299 150 - 400 K/uL  Type and screen Lamy     Status: None   Collection Time: 12/30/15  8:59 AM  Result Value Ref Range   ABO/RH(D) A POS    Antibody Screen NEG    Sample Expiration 01/02/2016   CBC     Status: Abnormal   Collection Time: 12/31/15  2:28 AM  Result Value Ref Range   WBC 8.7 4.0 - 10.5 K/uL   RBC 4.75 4.22 - 5.81 MIL/uL   Hemoglobin 14.9 13.0 - 17.0 g/dL   HCT 41.3 39.0 - 52.0 %   MCV 86.9 78.0 - 100.0 fL   MCH 31.4 26.0 - 34.0 pg   MCHC 36.1 (H) 30.0 - 36.0 g/dL   RDW 13.5 11.5 - 15.5 %   Platelets 283 150 - 400 K/uL  CBC     Status: None    Collection Time: 12/31/15  7:36 AM  Result Value Ref Range   WBC 6.5 4.0 - 10.5 K/uL   RBC 4.51 4.22 - 5.81 MIL/uL   Hemoglobin 13.9 13.0 - 17.0 g/dL   HCT 39.0 39.0 - 52.0 %   MCV 86.5 78.0 - 100.0 fL   MCH 30.8 26.0 - 34.0 pg   MCHC 35.6 30.0 - 36.0 g/dL   RDW 13.5  11.5 - 15.5 %   Platelets 263 150 - 400 K/uL   No results found.    Assessment/Plan Sigmoid mass Hematochezia  Colonoscopy 12/31/15 Dr Paulita Fujita - removal 8 mm polyp, Bx of sigmoid mass - follow path  CT Chest/Abd/Pelvis for staging  CEA pending   Continue NPO, IVF   Erythromycin base prep only - pt just finished bowel prep for colonoscopy and has been NPO  Laparoscopic assisted sigmoid colectomy tomorrow by Dr. Johnathan Hausen  External hemorrhoids Hypertension   Jill Alexanders, [facility] 12/31/2015, 10:39 AM Pager: [phone] Consults: [phone] Mon-Fri 7:00 am-4:30 pm Sat-Sun 7:00 am-11:30 am  I have seen the patient, evaluated his CT scan and discussed lap assisted sigmoid colectomy with him in detail.  Will proceed in the OR this morning with resection.    Kaylyn Lim, MD, FACS

## 2016-01-01 NOTE — Anesthesia Postprocedure Evaluation (Signed)
Anesthesia Post Note  Patient: Jermir Kalm  Procedure(s) Performed: Procedure(s) (LRB): LAPAROSCOPIC ASSISTED SIGMOID COLECTOMY (N/A)  Patient location during evaluation: PACU Anesthesia Type: General Level of consciousness: awake and alert Pain management: pain level controlled Vital Signs Assessment: post-procedure vital signs reviewed and stable Respiratory status: spontaneous breathing, nonlabored ventilation, respiratory function stable and patient connected to nasal cannula oxygen Cardiovascular status: blood pressure returned to baseline, stable and tachycardic Postop Assessment: no signs of nausea or vomiting Anesthetic complications: no    Last Vitals:  Vitals:   01/01/16 1630 01/01/16 1645  BP: (!) 147/87 132/80  Pulse: (!) 101 99  Resp: 16 16  Temp:  36.8 C    Last Pain:  Vitals:   01/01/16 1630  TempSrc:   PainSc: Asleep                 Othell Jaime,JAMES TERRILL

## 2016-01-01 NOTE — Op Note (Signed)
Surgeon: Kaylyn Lim, MD, FACS  Asst:  Leighton Ruff, MD, FACS  Anes:  general  Procedure: Laparoscopically assisted low anterior resection of the rectosigmoid with #33 EEA stapled anastomosis; mobilization of the splenic flexure;  rigid endoscopy  Diagnosis: Rectosigmoid cancer  Complications: none  EBL:   34 cc  Drains: none  Description of Procedure:  The patient was taken to OR 2  at [facility] on the [facility].  After anesthesia was administered and the patient was prepped a timeout was performed.  The patient was taken to room 2 and placed in the yellowfins stirrups after endotracheal anesthesia was administered. The abdomen was prepped with Technicare and the abdomen and in the perineum. After a timeout I entered the abdomen to the right upper quadrant using a 5 mm Optiview. 2 more Optiview for 2 more 5 mm were placed 1 midway below the umbilicus and the pubis and another one in the right lower quadrant. I was able to then used to inspect the abdomen. I didn't see any overt signs of metastatic disease.  I was able to mobilize left colon using Harmonic scalpel incising along the white line of [place] and carried this up to the splenic flexure which I mobilized. I then placed the patient in the headdown position and working to the pelvis. The mass that had been tattooed was really lying much lower and was more toward the distal sigmoid at the rectosigmoid junction. That point I used a hand port and made a small 10 cm incision which enlarged a little bit all well below the umbilicus. I mobilized the bowel laterally. I divided the bowel proximally with the tri-staple Endo GIA back Covidien. I went through the mesentery with the LigaSure coagulating at least twice before transecting and then stayed in the midline and along the sacrum coming down below the lesion was at least 6 cm below the lesion. I dissected up to the bowel at that level I divided it with the contour stapler using a green load. Specimen was  removed.  Dr. Marcello Moores came in and had help me perform the anastomosis using a [place]. The anvil was placed through the antimesenteric border of the proximal sigmoid using the green stick and and then stapled it distally again with the tri-stapler. I then inserted the 33 from below and the spike came out and we coupled these enclosed device. I was then fired. The stapler was withdrawn. The rigid sigmoidoscope was inserted and inflated and no bubbles were seen. The abdomen was then prepped again with some technique care irrigated clean admission drapes Instrument or use. Peritoneum was closed running 2-0 Vicryl and the fascia was closed in a running double-stranded PDS. The wound was irrigated again and closed staples. Prior to doing this I did inject the wounds with ex Brill tooth 30 mL. The lower midline incision was closed with stapler and the 25 mm ports which were used at the very and to reinflate the abdomen and irrigated look at everything one more time everything looked to be in order. I then withdrew the air and deflated and patient was taken to the recovery room in satisfactory condition.  The patient tolerated the procedure well and was taken to the PACU in stable condition.     Matt B. Hassell Done, [place], [facility], [place]

## 2016-01-01 NOTE — Transfer of Care (Signed)
Immediate Anesthesia Transfer of Care Note  Patient: Vincent Strickland  Procedure(s) Performed: Procedure(s): LAPAROSCOPIC ASSISTED SIGMOID COLECTOMY (N/A)  Patient Location: PACU  Anesthesia Type:General  Level of Consciousness:  sedated, patient cooperative and responds to stimulation  Airway & Oxygen Therapy:Patient Spontanous Breathing and Patient connected to face mask oxgen  Post-op Assessment:  Report given to PACU RN and Post -op Vital signs reviewed and stable  Post vital signs:  Reviewed and stable  Last Vitals:  Vitals:   12/31/15 2250 01/01/16 0639  BP:  124/81  Pulse: 64 61  Resp: 19 16  Temp: 36.6 C [id number] C    Complications: No apparent anesthesia complications

## 2016-01-01 NOTE — Progress Notes (Signed)
PROGRESS NOTE    Vincent Strickland  [id number] DOB: 1965/07/13 DOA: 12/30/2015 PCP: Anthoney Harada, MD   Brief Narrative:  Vincent Strickland is a 50 y.o. male with medical history significant of external Hemorrhoids, Kidney Stones, Hypertension and other comorbids who presented to [facility] with report of a few hour history of bright red blood per rectum yesterday. He reports having episodes of bright red blood per rectum on and off for a few years and it was attributed to hemorrhoids. His primary care physician was in discussion with him regarding possible early colonoscopy. Yesterday morning, however, while at work, he had 2 episodes of red watery stools. At that time, he decided to seek medical attention and come to the ED. Since being in the emergency department, he has had 3 more bright red stools. 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 Non-thrombosed External Hemorrhoids also found on perianal exam and no internal hemorrhoids. General Surgery was then consulted for evaluation of his Sigmoid Tumor. Patient underwent CT Chest/Abd/Pelvis with Contrast yesterday which showed the malignant partially obstructed tumor in the sigmoid colon along with small lymph nodes within in the surrounding fat which may reflect lymph node metastatsis. Patient today underwent Laparoscopically assisted low anterior resection of the Rectosigmoid with stapled Anastamosis and mobilization of the splenic flexure and was examined in PACU.   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 Non-thrombosed External Hemorrhoids also found on perianal exam and no internal hemorrhoids. -Surgical Pathology Results Pending -NPO currently -CEA Level to be checked -CT Chest/Abd/Pelvis with Contrast showed Segment of sigmoid colon with circumferential wall thickening corresponds to abnormal colonoscopy findings from today demonstrating a malignant, partially obstructed tumor in the sigmoid colon. Small lymph nodes within the surrounding fat are noted and may reflect local lymph node metastasis.  No specific findings identified to suggest distant metastatic disease -General Surgery CCS Consulted by GI for further Recc's and Evaluation; Patient underwent Surgery today -Pain Control via General Surgery and Nausea Control with Phenergran -Patient received Cefotetan 2 gram IV for Surgical Prophylaxis -C/w IVF of D5 1/2 NS at 75 mL/hr  Bright Red Blood Per Rectum/Hematochezia likely 2/2 to 8 mm Polyp in Sigmoid Colon as well as Rectosigmoid Cancer -As Above  -Patient received Erythromycin 1000 mg TID yesterday -CBC showed Hb/Hct decline from 15.4/42.9 -> 13.9/39.0 -> 12.9/36/3;  -Continue to Monitor CBC's -Type and Screen;   Non-Thrombosed External Hemorrhoids -Chronic. -Not source of Lower GI Bleeding per GI -Continue to Monitor CBC's  Hypertension -Patient on home dose of Losartan/HCTZ 100-25 mg po Daily -Added Hydralazine 5 mg IV q6hprn for High Blood Pressure  Hyperlipidemia -Lipid Panel showed Cholesterol  of 198, TG of 156, HDL of 24, LDL of 143, and VLDL of  31 -Patient states it was Diet Controlled. -Will likely need to Initiate Statin at some point. Patient Allergic to Niacin  Hypokalemia -Patient's K+ was 2.8 this AM -Replete with 30 mEQ MultiRun -Repeat CMP in AM  Hx of Nephrolithiasis -CT Abd/Pelvis with Contrast yesterday showed Adrenals/Urinary Tract: The adrenal glands are normal. Normal appearance of the right kidney. Nonobstructing calculus identified within the inferior pole the left kidney. Cyst within the lower pole of the left kidney measures 2.4 cm, image 80 of series 3. Several smaller cysts are also identified within the upper and mid left kidney there is no mass or hydronephrosis identified. The urinary bladder appears normal  Small Lung Nodules -CT of Chest showed There are a total of 4 small (less than 5 mm) nodules identified within the right middle lobe and right base. Their appearance is nonspecific and unfortunately there are no prior chest CTs for comparison. Close interval followup of these lymph nodes is advise.  Hepatic Steatosis and Low-Attenuation Structure of the Left Lobe of Liver -CT Scan showed 8 mm low-attenuation structure within the lateral segment of left lobe of liver is too small to characterize and Hepatic steatosis  DVT prophylaxis: SCDs Code Status: FULL Family Communication: No family present at bedside Disposition Plan: [place] likely; Will obtain PT Eval when patient improved  Consultants:   [facility]  General Surgery  Procedures: Colonoscopy; Laprascopically assisterd Rectosigmoid Removal and Anastamosis  Antimicrobials: Cefotetan 2 gram for Surgical Prophylaxis  Subjective: Seen and examined after Surgery and patient stated he was "wiped." Appeared to be drowsy and in some pain. No N/V. No Cp. No family at bedside as patient was in PACU.    Objective: Vitals:   01/01/16 1600 01/01/16 1615 01/01/16 1630 01/01/16 1645  BP: (!) 148/95 135/84 (!) 147/87 132/80   Pulse: (!) 109 (!) 104 (!) 101 99  Resp: 18 18 16 16   Temp:    98.3 F (36.8 C)  TempSrc:      SpO2: 97% 95% 92% 97%  Weight:      Height:        Intake/Output Summary (Last 24 hours) at 01/01/16 1657 Last data filed at 01/01/16 1630  Gross per 24 hour  Intake             3670 ml  Output             1210 ml  Net             2460 ml   Filed Weights   12/30/15 0844 12/30/15 1430 12/31/15 0853  Weight: 112.4 kg (247 lb 12.8 oz) 113.3 kg (249 lb 12.5 oz) 112 kg (247 lb)   Examination: Physical Exam:  Constitutional: WN/WD, NAD and appears in moderate pain Eyes: Lids and conjunctivae normal, sclerae anicteric  ENMT: External Ears, Nose appear normal. Grossly normal hearing.  Neck: Appears normal, supple, no cervical masses, normal ROM, no appreciable thyromegaly Respiratory: Clear to auscultation bilaterally, no wheezing, rales, rhonchi or crackles. Normal respiratory effort and patient is not tachypenic. No accessory muscle use.  Cardiovascular: RRR, no murmurs / rubs / gallops. S1 and S2 auscultated. No extremity edema.  Abdomen: Soft, non-tender, non-distended. No masses palpated. No appreciable hepatosplenomegaly. Bowel sounds positive. Mid Abdominal incision bandaged.   GU: Deferred. Foley in place.  Musculoskeletal: No clubbing / cyanosis of digits/nails. No joint deformity upper and lower extremities. No contractures. Normal strength  and muscle tone.  Skin: No rashes, lesions, ulcers. No induration; Warm and dry.  Neurologic: Drowsy during exam but CN 2-12 grossly intact with no focal deficits. Sensation intact in all 4 Extremities. Romberg sign cerebellar reflexes not assessed.  Psychiatric: Normal judgment and insight. Alert and responsive to questioning. Somnolent mood and appropriate affect.   Data Reviewed: I have personally reviewed following labs and imaging studies  CBC:  Recent Labs Lab 12/31/15 0228 12/31/15 0736 12/31/15 1353 12/31/15 2030 01/01/16 0138   WBC 8.7 6.5 7.4 8.8 7.9  NEUTROABS  --   --   --   --  4.4  HGB 14.9 13.9 13.8 13.3 12.9*  HCT 41.3 39.0 38.5* 36.7* 36.3*  MCV 86.9 86.5 85.0 85.9 86.0  PLT 283 263 260 271 0000000   Basic Metabolic Panel:  Recent Labs Lab 12/30/15 0859 12/31/15 0746 01/01/16 0138  NA 142 141 140  K 3.0* 3.0* 2.8*  CL 106 105 106  CO2 27 28 27   GLUCOSE 116* 97 104*  BUN 16 15 13   CREATININE 0.96 0.81 0.87  CALCIUM 9.2 8.9 8.5*  MG  --   --  2.1  PHOS  --   --  3.6   GFR: Estimated Creatinine Clearance: 134.2 mL/min (by C-G formula based on SCr of 0.87 mg/dL). Liver Function Tests:  Recent Labs Lab 12/30/15 0859 12/31/15 0746 01/01/16 0138  AST 27 27 26   ALT 42 46 42  ALKPHOS 70 61 60  BILITOT 1.1 1.0 1.2  PROT 7.7 6.8 6.5  ALBUMIN 4.8 4.1 4.0   No results for input(s): LIPASE, AMYLASE in the last 168 hours. No results for input(s): AMMONIA in the last 168 hours. Coagulation Profile: No results for input(s): INR, PROTIME in the last 168 hours. Cardiac Enzymes: No results for input(s): CKTOTAL, CKMB, CKMBINDEX, TROPONINI in the last 168 hours. BNP (last 3 results) No results for input(s): PROBNP in the last 8760 hours. HbA1C: No results for input(s): HGBA1C in the last 72 hours. CBG: No results for input(s): GLUCAP in the last 168 hours. Lipid Profile:  Recent Labs  01/01/16 0138  CHOL 198  HDL 24*  LDLCALC 143*  TRIG 156*  CHOLHDL 8.3   Thyroid Function Tests: No results for input(s): TSH, T4TOTAL, FREET4, T3FREE, THYROIDAB in the last 72 hours. Anemia Panel: No results for input(s): VITAMINB12, FOLATE, FERRITIN, TIBC, IRON, RETICCTPCT in the last 72 hours. Sepsis Labs: No results for input(s): PROCALCITON, LATICACIDVEN in the last 168 hours.  Recent Results (from the past 240 hour(s))  Surgical pcr screen     Status: Abnormal   Collection Time: 12/31/15 10:42 PM  Result Value Ref Range Status   MRSA, PCR NEGATIVE NEGATIVE Final   Staphylococcus aureus  POSITIVE (A) NEGATIVE Final    Comment:        The Xpert SA Assay (FDA approved for NASAL specimens in patients over 42 years of age), is one component of a comprehensive surveillance program.  Test performance has been validated by [facility] for patients greater than or equal to 69 year old. It is not intended to diagnose infection nor to guide or monitor treatment.      Radiology Studies: Ct Chest W Contrast  Result Date: 12/31/2015 CLINICAL DATA:  Colon cancer.  Staging. EXAM: CT CHEST, ABDOMEN, AND PELVIS WITH CONTRAST TECHNIQUE: Multidetector CT imaging of the chest, abdomen and pelvis was performed following the standard protocol during bolus administration of intravenous contrast. CONTRAST:  190mL ISOVUE-300 IOPAMIDOL (ISOVUE-300)  INJECTION 61% COMPARISON:  None. FINDINGS: CT CHEST FINDINGS Cardiovascular: Normal heart size.  No pericardial effusion. Mediastinum/Nodes: The trachea appears patent and is midline. No mediastinal or hilar adenopathy. No axillary or supraclavicular adenopathy. Normal appearance of the esophagus. Lungs/Pleura: No pleural fluid. No airspace consolidation or atelectasis. 3 mm right middle lobe lung nodule is identified, image number 92 of series 7. Also in the right middle lobe is a 4 mm lung nodule, image 103 of series 7. 4 mm right lower lobe lung nodule is identified, image 106 of series 7. Also in the right middle lobe is a 3 mm right lower lobe lung nodule, image 109 of series 7. No lung nodules identified within the left lung. Musculoskeletal: No chest wall mass or suspicious bone lesions identified. CT ABDOMEN PELVIS FINDINGS Hepatobiliary: There is diffuse hepatic steatosis. Gallbladder appears normal. Tiny low density structure within the posterior aspect of the lateral segment of left lobe measures 8 mm, image 52 of series 3. Pancreas: Unremarkable. No pancreatic ductal dilatation or surrounding inflammatory changes. Spleen: Normal in size without focal  abnormality. Adrenals/Urinary Tract: The adrenal glands are normal. Normal appearance of the right kidney. Nonobstructing calculus identified within the inferior pole the left kidney. Cyst within the lower pole of the left kidney measures 2.4 cm, image 80 of series 3. Several smaller cysts are also identified within the upper and mid left kidney there is no mass or hydronephrosis identified. The urinary bladder appears normal. Stomach/Bowel: Stomach is normal. The small bowel loops have a normal course and caliber. No pathologic dilatation of the large or small bowel loops. Circumferential wall thickening involving the 5 cm segment of sigmoid colon is identified, image number 109 of series 3. There are several lymph nodes within the adjacent fat which measure up to 9 mm, image 106 of series 3, image 105 of series 3. Vascular/Lymphatic: Normal appearance of the abdominal aorta. No enlarged upper abdominal lymph nodes. No pathologically enlarged iliac or inguinal lymph nodes. Reproductive: Prostate is unremarkable. Other: There is no ascites or focal fluid collections within the abdomen or pelvis. Musculoskeletal: No aggressive lytic or sclerotic bone lesions identified. IMPRESSION: 1. Segment of sigmoid colon with circumferential wall thickening corresponds to abnormal colonoscopy findings from today demonstrating a malignant, partially obstructed tumor in the sigmoid colon. 2. Small lymph nodes within the surrounding fat are noted and may reflect local lymph node metastasis. 3. No specific findings identified to suggest distant metastatic disease. 4. There are a total of 4 small (less than 5 mm) nodules identified within the right middle lobe and right base. Their appearance is nonspecific and unfortunately there are no prior chest CTs for comparison. Close interval followup of these lymph nodes is advise. 5. 8 mm low-attenuation structure within the lateral segment of left lobe of liver is too small to characterize  6. Hepatic steatosis Electronically Signed   By: Kerby Moors M.D.   On: 12/31/2015 17:27   Ct Abdomen Pelvis W Contrast  Result Date: 12/31/2015 CLINICAL DATA:  Colon cancer.  Staging. EXAM: CT CHEST, ABDOMEN, AND PELVIS WITH CONTRAST TECHNIQUE: Multidetector CT imaging of the chest, abdomen and pelvis was performed following the standard protocol during bolus administration of intravenous contrast. CONTRAST:  168mL ISOVUE-300 IOPAMIDOL (ISOVUE-300) INJECTION 61% COMPARISON:  None. FINDINGS: CT CHEST FINDINGS Cardiovascular: Normal heart size.  No pericardial effusion. Mediastinum/Nodes: The trachea appears patent and is midline. No mediastinal or hilar adenopathy. No axillary or supraclavicular adenopathy. Normal appearance of the esophagus. Lungs/Pleura: No  pleural fluid. No airspace consolidation or atelectasis. 3 mm right middle lobe lung nodule is identified, image number 92 of series 7. Also in the right middle lobe is a 4 mm lung nodule, image 103 of series 7. 4 mm right lower lobe lung nodule is identified, image 106 of series 7. Also in the right middle lobe is a 3 mm right lower lobe lung nodule, image 109 of series 7. No lung nodules identified within the left lung. Musculoskeletal: No chest wall mass or suspicious bone lesions identified. CT ABDOMEN PELVIS FINDINGS Hepatobiliary: There is diffuse hepatic steatosis. Gallbladder appears normal. Tiny low density structure within the posterior aspect of the lateral segment of left lobe measures 8 mm, image 52 of series 3. Pancreas: Unremarkable. No pancreatic ductal dilatation or surrounding inflammatory changes. Spleen: Normal in size without focal abnormality. Adrenals/Urinary Tract: The adrenal glands are normal. Normal appearance of the right kidney. Nonobstructing calculus identified within the inferior pole the left kidney. Cyst within the lower pole of the left kidney measures 2.4 cm, image 80 of series 3. Several smaller cysts are also  identified within the upper and mid left kidney there is no mass or hydronephrosis identified. The urinary bladder appears normal. Stomach/Bowel: Stomach is normal. The small bowel loops have a normal course and caliber. No pathologic dilatation of the large or small bowel loops. Circumferential wall thickening involving the 5 cm segment of sigmoid colon is identified, image number 109 of series 3. There are several lymph nodes within the adjacent fat which measure up to 9 mm, image 106 of series 3, image 105 of series 3. Vascular/Lymphatic: Normal appearance of the abdominal aorta. No enlarged upper abdominal lymph nodes. No pathologically enlarged iliac or inguinal lymph nodes. Reproductive: Prostate is unremarkable. Other: There is no ascites or focal fluid collections within the abdomen or pelvis. Musculoskeletal: No aggressive lytic or sclerotic bone lesions identified. IMPRESSION: 1. Segment of sigmoid colon with circumferential wall thickening corresponds to abnormal colonoscopy findings from today demonstrating a malignant, partially obstructed tumor in the sigmoid colon. 2. Small lymph nodes within the surrounding fat are noted and may reflect local lymph node metastasis. 3. No specific findings identified to suggest distant metastatic disease. 4. There are a total of 4 small (less than 5 mm) nodules identified within the right middle lobe and right base. Their appearance is nonspecific and unfortunately there are no prior chest CTs for comparison. Close interval followup of these lymph nodes is advise. 5. 8 mm low-attenuation structure within the lateral segment of left lobe of liver is too small to characterize 6. Hepatic steatosis Electronically Signed   By: Kerby Moors M.D.   On: 12/31/2015 17:27   COLONOSCOPY       - Non-thrombosed external hemorrhoids found on           perianal exam. Small. Not source of bleeding. No           internal hemorrhoids.       - One 8 mm polyp in the sigmoid  colon, removed with           a hot snare. Resected and retrieved.       - Likely malignant partially obstructing tumor in           the sigmoid colon. Biopsied. Tattooed.  Scheduled Meds: . acetaminophen      . [MAR Hold] Chlorhexidine Gluconate Cloth  6 each Topical Q0600  . [MAR Hold] losartan  100 mg Oral Daily  And  . [MAR Hold] hydrochlorothiazide  25 mg Oral Daily  . HYDROmorphone      . [MAR Hold] mupirocin ointment  1 application Nasal BID  . [MAR Hold] potassium chloride (KCL MULTIRUN) 30 mEq in 265 mL IVPB  30 mEq Intravenous Once   Continuous Infusions: . dextrose 5 % and 0.45% NaCl 75 mL/hr at 01/01/16 0534  . lactated ringers 1,000 mL (01/01/16 1612)    LOS: 1 day   Kerney Elbe, DO [facility] Pager [phone]  If 7PM-7AM, please contact night-coverage [email or web address] Password [facility] 01/01/2016, 4:57 PM

## 2016-01-02 DIAGNOSIS — C187 Malignant neoplasm of sigmoid colon: Secondary | ICD-10-CM

## 2016-01-02 DIAGNOSIS — R918 Other nonspecific abnormal finding of lung field: Secondary | ICD-10-CM

## 2016-01-02 DIAGNOSIS — E876 Hypokalemia: Secondary | ICD-10-CM

## 2016-01-02 LAB — CBC WITH DIFFERENTIAL/PLATELET
BASOS ABS: 0 10*3/uL (ref 0.0–0.1)
Basophils Relative: 0 %
EOS ABS: 0 10*3/uL (ref 0.0–0.7)
EOS PCT: 0 %
HCT: 32.9 % — ABNORMAL LOW (ref 39.0–52.0)
Hemoglobin: 11.9 g/dL — ABNORMAL LOW (ref 13.0–17.0)
LYMPHS PCT: 7 %
Lymphs Abs: 1.1 10*3/uL (ref 0.7–4.0)
MCH: 31.1 pg (ref 26.0–34.0)
MCHC: 36.2 g/dL — ABNORMAL HIGH (ref 30.0–36.0)
MCV: 85.9 fL (ref 78.0–100.0)
Monocytes Absolute: 0.9 10*3/uL (ref 0.1–1.0)
Monocytes Relative: 6 %
NEUTROS PCT: 87 %
Neutro Abs: 13.3 10*3/uL — ABNORMAL HIGH (ref 1.7–7.7)
PLATELETS: 286 10*3/uL (ref 150–400)
RBC: 3.83 MIL/uL — AB (ref 4.22–5.81)
RDW: 13.4 % (ref 11.5–15.5)
WBC: 15.3 10*3/uL — AB (ref 4.0–10.5)

## 2016-01-02 LAB — COMPREHENSIVE METABOLIC PANEL
ALT: 34 U/L (ref 17–63)
AST: 21 U/L (ref 15–41)
Albumin: 3.7 g/dL (ref 3.5–5.0)
Alkaline Phosphatase: 49 U/L (ref 38–126)
Anion gap: 6 (ref 5–15)
BUN: 13 mg/dL (ref 6–20)
CHLORIDE: 106 mmol/L (ref 101–111)
CO2: 28 mmol/L (ref 22–32)
CREATININE: 0.92 mg/dL (ref 0.61–1.24)
Calcium: 8.2 mg/dL — ABNORMAL LOW (ref 8.9–10.3)
GFR calc Af Amer: >60 mL/min (ref >60)
GFR calc non Af Amer: >60 mL/min (ref >60)
Glucose, Bld: 130 mg/dL — ABNORMAL HIGH (ref 65–99)
Potassium: 3 mmol/L — ABNORMAL LOW (ref 3.5–5.1)
SODIUM: 140 mmol/L (ref 135–145)
Total Bilirubin: 0.8 mg/dL (ref 0.3–1.2)
Total Protein: 6.1 g/dL — ABNORMAL LOW (ref 6.5–8.1)

## 2016-01-02 LAB — PHOSPHORUS: Phosphorus: 3.6 mg/dL (ref 2.5–4.6)

## 2016-01-02 LAB — CEA: CEA: 12.9 ng/mL — AB (ref 0.0–4.7)

## 2016-01-02 LAB — MAGNESIUM: Magnesium: 2.2 mg/dL (ref 1.7–2.4)

## 2016-01-02 MED ORDER — SODIUM CHLORIDE 0.9 % IV SOLN
30.0000 meq | Freq: Once | INTRAVENOUS | Status: AC
  Administered 2016-01-02: 30 meq via INTRAVENOUS
  Filled 2016-01-02: qty 15

## 2016-01-02 MED ORDER — KETOROLAC TROMETHAMINE 30 MG/ML IJ SOLN
30.0000 mg | Freq: Three times a day (TID) | INTRAMUSCULAR | Status: AC
  Administered 2016-01-02 – 2016-01-05 (×9): 30 mg via INTRAVENOUS
  Filled 2016-01-02 (×9): qty 1

## 2016-01-02 MED ORDER — KCL IN DEXTROSE-NACL 40-5-0.9 MEQ/L-%-% IV SOLN
INTRAVENOUS | Status: DC
  Administered 2016-01-02: 15:00:00 via INTRAVENOUS
  Administered 2016-01-02: 125 mL/h via INTRAVENOUS
  Administered 2016-01-03 – 2016-01-04 (×2): via INTRAVENOUS
  Filled 2016-01-02 (×6): qty 1000

## 2016-01-02 MED ORDER — ACETAMINOPHEN-CODEINE #3 300-30 MG PO TABS
1.0000 | ORAL_TABLET | ORAL | Status: DC | PRN
  Administered 2016-01-02 – 2016-01-05 (×8): 2 via ORAL
  Filled 2016-01-02 (×8): qty 2

## 2016-01-02 NOTE — Progress Notes (Signed)
Subjective: Abdomen sore post-operatively, no other complaints.  Objective: Vital signs in last 24 hours: Temp:  [97.8 F (36.6 C)-98.5 F (36.9 C)] 97.8 F (36.6 C) (12/08 0521) Pulse Rate:  [86-110] 86 (12/08 0521) Resp:  [12-22] 16 (12/08 0521) BP: (114-155)/(69-101) 114/70 (12/08 0521) SpO2:  [92 %-100 %] 97 % (12/08 0521) Weight change:  Last BM Date: 12/31/15  PE: GEN:  NAD  Lab Results: CBC    Component Value Date/Time   WBC 15.3 (H) 01/02/2016 0431   RBC 3.83 (L) 01/02/2016 0431   HGB 11.9 (L) 01/02/2016 0431   HCT 32.9 (L) 01/02/2016 0431   PLT 286 01/02/2016 0431   MCV 85.9 01/02/2016 0431   MCH 31.1 01/02/2016 0431   MCHC 36.2 (H) 01/02/2016 0431   RDW 13.4 01/02/2016 0431   LYMPHSABS 1.1 01/02/2016 0431   MONOABS 0.9 01/02/2016 0431   EOSABS 0.0 01/02/2016 0431   BASOSABS 0.0 01/02/2016 0431   CMP     Component Value Date/Time   NA 140 01/02/2016 0431   K 3.0 (L) 01/02/2016 0431   CL 106 01/02/2016 0431   CO2 28 01/02/2016 0431   GLUCOSE 130 (H) 01/02/2016 0431   BUN 13 01/02/2016 0431   CREATININE 0.92 01/02/2016 0431   CREATININE 0.95 06/06/2015 0858   CALCIUM 8.2 (L) 01/02/2016 0431   PROT 6.1 (L) 01/02/2016 0431   ALBUMIN 3.7 01/02/2016 0431   AST 21 01/02/2016 0431   ALT 34 01/02/2016 0431   ALKPHOS 49 01/02/2016 0431   BILITOT 0.8 01/02/2016 0431   GFRNONAA >60 01/02/2016 0431   GFRNONAA >89 06/06/2015 0858   GFRAA >60 01/02/2016 0431   GFRAA >89 06/06/2015 0858   Assessment:  1.  Hematochezia, ongoing, due to sigmoid colon adenocarcinoma. 2.  Sigmoid colon cancer. 3.  POD 1, sigmoid colectomy.  Plan:  1.  Advance diet, mobility, etc, per surgical team. 2.  Needs repeat colonoscopy in one year. 3.  I advised patient to have first-degree relatives >/= 48 years old to have colonoscopy. 4.  No further GI tract testing or intervention planned this admission. 5.  [facility] will sign-off; please call with questions; thank you for the  consultation.   Landry Dyke 01/02/2016, 10:12 AM   Pager [phone] If no answer or after 5 PM call [phone]

## 2016-01-02 NOTE — Progress Notes (Signed)
1 Day Post-Op  Subjective: He looks very good for POD1.  He has allot of questions.  Concerned with findings of nodules RML and RLL. Also concerned with tumor location.  I think he remembers enough to know that is was somewhat different than what was expected.  I told him Dr. Hassell Done would talk with him later about it.    Objective: Vital signs in last 24 hours: Temp:  [97.8 F (36.6 C)-98.6 F (37 C)] 98.6 F (37 C) (12/08 1000) Pulse Rate:  [86-110] 99 (12/08 1000) Resp:  [12-22] 16 (12/08 1000) BP: (114-155)/(69-101) 130/89 (12/08 1000) SpO2:  [92 %-100 %] 96 % (12/08 1000) Last BM Date: 12/31/15  NPO 4300IV fluid 2500urine NO Bm Afebrile, VSS K+ 3.0/mag 2.2 WBC 15.3  Intake/Output from previous day: 12/07 0701 - 12/08 0700 In: 4426.7 [I.V.:4276.7; IV Piggyback:150] Out: 2560 [Urine:2460; Blood:100] Intake/Output this shift: Total I/O In: 0  Out: 700 [Urine:700]  General appearance: alert, cooperative, no distress and anxious about surgery.  Feels better with new pain med. Resp: clear to auscultation bilaterally GI: His abdominal wound looks fine.  he appears fairly comfortable.  No nausea, ? flatus  Lab Results:   Recent Labs  01/01/16 0138 01/02/16 0431  WBC 7.9 15.3*  HGB 12.9* 11.9*  HCT 36.3* 32.9*  PLT 261 286    BMET  Recent Labs  01/01/16 0138 01/02/16 0431  NA 140 140  K 2.8* 3.0*  CL 106 106  CO2 27 28  GLUCOSE 104* 130*  BUN 13 13  CREATININE 0.87 0.92  CALCIUM 8.5* 8.2*   PT/INR No results for input(s): LABPROT, INR in the last 72 hours.   Recent Labs Lab 12/30/15 0859 12/31/15 0746 01/01/16 0138 01/02/16 0431  AST 27 27 26 21   ALT 42 46 42 34  ALKPHOS 70 61 60 49  BILITOT 1.1 1.0 1.2 0.8  PROT 7.7 6.8 6.5 6.1*  ALBUMIN 4.8 4.1 4.0 3.7     Lipase  No results found for: LIPASE   Studies/Results: Ct Chest W Contrast  Result Date: 12/31/2015 CLINICAL DATA:  Colon cancer.  Staging. EXAM: CT CHEST, ABDOMEN, AND PELVIS  WITH CONTRAST TECHNIQUE: Multidetector CT imaging of the chest, abdomen and pelvis was performed following the standard protocol during bolus administration of intravenous contrast. CONTRAST:  14mL ISOVUE-300 IOPAMIDOL (ISOVUE-300) INJECTION 61% COMPARISON:  None. FINDINGS: CT CHEST FINDINGS Cardiovascular: Normal heart size.  No pericardial effusion. Mediastinum/Nodes: The trachea appears patent and is midline. No mediastinal or hilar adenopathy. No axillary or supraclavicular adenopathy. Normal appearance of the esophagus. Lungs/Pleura: No pleural fluid. No airspace consolidation or atelectasis. 3 mm right middle lobe lung nodule is identified, image number 92 of series 7. Also in the right middle lobe is a 4 mm lung nodule, image 103 of series 7. 4 mm right lower lobe lung nodule is identified, image 106 of series 7. Also in the right middle lobe is a 3 mm right lower lobe lung nodule, image 109 of series 7. No lung nodules identified within the left lung. Musculoskeletal: No chest wall mass or suspicious bone lesions identified. CT ABDOMEN PELVIS FINDINGS Hepatobiliary: There is diffuse hepatic steatosis. Gallbladder appears normal. Tiny low density structure within the posterior aspect of the lateral segment of left lobe measures 8 mm, image 52 of series 3. Pancreas: Unremarkable. No pancreatic ductal dilatation or surrounding inflammatory changes. Spleen: Normal in size without focal abnormality. Adrenals/Urinary Tract: The adrenal glands are normal. Normal appearance of the right kidney.  Nonobstructing calculus identified within the inferior pole the left kidney. Cyst within the lower pole of the left kidney measures 2.4 cm, image 80 of series 3. Several smaller cysts are also identified within the upper and mid left kidney there is no mass or hydronephrosis identified. The urinary bladder appears normal. Stomach/Bowel: Stomach is normal. The small bowel loops have a normal course and caliber. No pathologic  dilatation of the large or small bowel loops. Circumferential wall thickening involving the 5 cm segment of sigmoid colon is identified, image number 109 of series 3. There are several lymph nodes within the adjacent fat which measure up to 9 mm, image 106 of series 3, image 105 of series 3. Vascular/Lymphatic: Normal appearance of the abdominal aorta. No enlarged upper abdominal lymph nodes. No pathologically enlarged iliac or inguinal lymph nodes. Reproductive: Prostate is unremarkable. Other: There is no ascites or focal fluid collections within the abdomen or pelvis. Musculoskeletal: No aggressive lytic or sclerotic bone lesions identified. IMPRESSION: 1. Segment of sigmoid colon with circumferential wall thickening corresponds to abnormal colonoscopy findings from today demonstrating a malignant, partially obstructed tumor in the sigmoid colon. 2. Small lymph nodes within the surrounding fat are noted and may reflect local lymph node metastasis. 3. No specific findings identified to suggest distant metastatic disease. 4. There are a total of 4 small (less than 5 mm) nodules identified within the right middle lobe and right base. Their appearance is nonspecific and unfortunately there are no prior chest CTs for comparison. Close interval followup of these lymph nodes is advise. 5. 8 mm low-attenuation structure within the lateral segment of left lobe of liver is too small to characterize 6. Hepatic steatosis Electronically Signed   By: Kerby Moors M.D.   On: 12/31/2015 17:27   Ct Abdomen Pelvis W Contrast  Result Date: 12/31/2015 CLINICAL DATA:  Colon cancer.  Staging. EXAM: CT CHEST, ABDOMEN, AND PELVIS WITH CONTRAST TECHNIQUE: Multidetector CT imaging of the chest, abdomen and pelvis was performed following the standard protocol during bolus administration of intravenous contrast. CONTRAST:  124mL ISOVUE-300 IOPAMIDOL (ISOVUE-300) INJECTION 61% COMPARISON:  None. FINDINGS: CT CHEST FINDINGS  Cardiovascular: Normal heart size.  No pericardial effusion. Mediastinum/Nodes: The trachea appears patent and is midline. No mediastinal or hilar adenopathy. No axillary or supraclavicular adenopathy. Normal appearance of the esophagus. Lungs/Pleura: No pleural fluid. No airspace consolidation or atelectasis. 3 mm right middle lobe lung nodule is identified, image number 92 of series 7. Also in the right middle lobe is a 4 mm lung nodule, image 103 of series 7. 4 mm right lower lobe lung nodule is identified, image 106 of series 7. Also in the right middle lobe is a 3 mm right lower lobe lung nodule, image 109 of series 7. No lung nodules identified within the left lung. Musculoskeletal: No chest wall mass or suspicious bone lesions identified. CT ABDOMEN PELVIS FINDINGS Hepatobiliary: There is diffuse hepatic steatosis. Gallbladder appears normal. Tiny low density structure within the posterior aspect of the lateral segment of left lobe measures 8 mm, image 52 of series 3. Pancreas: Unremarkable. No pancreatic ductal dilatation or surrounding inflammatory changes. Spleen: Normal in size without focal abnormality. Adrenals/Urinary Tract: The adrenal glands are normal. Normal appearance of the right kidney. Nonobstructing calculus identified within the inferior pole the left kidney. Cyst within the lower pole of the left kidney measures 2.4 cm, image 80 of series 3. Several smaller cysts are also identified within the upper and mid left kidney there is  no mass or hydronephrosis identified. The urinary bladder appears normal. Stomach/Bowel: Stomach is normal. The small bowel loops have a normal course and caliber. No pathologic dilatation of the large or small bowel loops. Circumferential wall thickening involving the 5 cm segment of sigmoid colon is identified, image number 109 of series 3. There are several lymph nodes within the adjacent fat which measure up to 9 mm, image 106 of series 3, image 105 of series 3.  Vascular/Lymphatic: Normal appearance of the abdominal aorta. No enlarged upper abdominal lymph nodes. No pathologically enlarged iliac or inguinal lymph nodes. Reproductive: Prostate is unremarkable. Other: There is no ascites or focal fluid collections within the abdomen or pelvis. Musculoskeletal: No aggressive lytic or sclerotic bone lesions identified. IMPRESSION: 1. Segment of sigmoid colon with circumferential wall thickening corresponds to abnormal colonoscopy findings from today demonstrating a malignant, partially obstructed tumor in the sigmoid colon. 2. Small lymph nodes within the surrounding fat are noted and may reflect local lymph node metastasis. 3. No specific findings identified to suggest distant metastatic disease. 4. There are a total of 4 small (less than 5 mm) nodules identified within the right middle lobe and right base. Their appearance is nonspecific and unfortunately there are no prior chest CTs for comparison. Close interval followup of these lymph nodes is advise. 5. 8 mm low-attenuation structure within the lateral segment of left lobe of liver is too small to characterize 6. Hepatic steatosis Electronically Signed   By: Kerby Moors M.D.   On: 12/31/2015 17:27   Prior to Admission medications   Medication Sig Start Date End Date Taking? Authorizing Provider  losartan-hydrochlorothiazide (HYZAAR) 100-25 MG tablet Take 1 tablet by mouth daily. 08/19/15  Yes Susy Frizzle, MD  potassium chloride SA (K-DUR,KLOR-CON) 20 MEQ tablet Take 1 tablet (20 mEq total) by mouth daily. 06/20/15  Yes Susy Frizzle, MD  potassium chloride (K-DUR) 10 MEQ tablet Take 1 tablet (10 mEq total) by mouth daily x 5days Patient not taking: Reported on 12/30/2015 05/08/15   Susy Frizzle, MD  pravastatin (PRAVACHOL) 20 MG tablet TAKE 1 TABLET (20 MG TOTAL) BY MOUTH DAILY. Patient not taking: Reported on 12/30/2015 11/05/15   Susy Frizzle, MD     Medications: . heparin subcutaneous  5,000  Units Subcutaneous Q8H  . saccharomyces boulardii  250 mg Oral BID   . 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 Hassell Done would talk with him today and he can ask questions directly.  Keep him on ice chips and sips for now.  Pain meds:  He does not want Percocet or Vicodin because of past experience with these drugs.  He is on  Fentanyl now, and I will add some Toradol.  He likes and has experience with Tylenol #3 with kidney stones, will put him on this also.       LOS: 2 days    Angellica Maddison 01/02/2016 [id number]

## 2016-01-02 NOTE — Progress Notes (Signed)
PROGRESS NOTE    Vincent Strickland  [id number] DOB: 1965/11/04 DOA: 12/30/2015 PCP: Anthoney Harada, MD   Brief Narrative:  Vincent Strickland is a 50 y.o. male with medical history significant of external Hemorrhoids, Kidney Stones, Hypertension and other comorbids who presented to [facility] with report of a few hour history of bright red blood per rectum yesterday. He reports having episodes of bright red blood per rectum on and off for a few years and it was attributed to hemorrhoids. His primary care physician was in discussion with him regarding possible early colonoscopy. Yesterday morning, however, while at work, he had 2 episodes of red watery stools. At that time, he decided to seek medical attention and come to the ED. Since being in the emergency department, he has had 3 more bright red stools. 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 Non-thrombosed External Hemorrhoids also found on perianal exam and no internal hemorrhoids. General Surgery was then consulted for evaluation of his Sigmoid Tumor which was found to be moderately differentiated invasive Colorectal Adenocarcinoma. Patient underwent CT Chest/Abd/Pelvis with Contrast yesterday which showed the malignant partially obstructed tumor in the sigmoid colon along with small lymph nodes within in the surrounding fat which may reflect lymph node metastatsis. Patient yesterday underwent Laparoscopically assisted low anterior resection of the Rectosigmoid with stapled Anastamosis and mobilization of the splenic flexure. 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 Non-thrombosed External Hemorrhoids also found on perianal exam and no internal hemorrhoids. -Surgical Pathology Results showed The findings are consistent with moderately differentiated invasive colorectal Adenocarcinoma. -NPO except Ice Chips and Sips currently -CEA Level was 12.9 -CT Chest/Abd/Pelvis with Contrast showed Segment of sigmoid colon with circumferential wall thickening corresponds to abnormal colonoscopy findings from today demonstrating a malignant, partially obstructed tumor in the sigmoid colon. Small lymph nodes within the surrounding fat are noted and may reflect local lymph node metastasis.  No specific findings identified to suggest distant metastatic disease -General Surgery CCS Consulted by GI for further Recc's and Evaluation; Patient underwent Surgery yesterday -Pain Control via General Surgery: C/w Acetaminophen-Codeine 300-30 mg 1-2 mg po q4hprn for Moderate Pain, Fentanyl 12.5 mcg IV q1hprn for Severe Pain, Ketorolac 30 mg IV q8h x 3 Days -Nausea Control with Zofran -Patient received Cefotetan 2 gram IV for Surgical Prophylaxis; C/w Florastor 250 mg po BID per General Surgery -C/w IVF of D5W and NS with 40 mEQ mL/hr at 125 mL/hr  -Consulted Medical Oncology Dr. Marin Olp for Recc's and Evaluation for Neoadjuvant Chemotherapy  Bright Red Blood Per Rectum/Hematochezia likely 2/2 to 8 mm Polyp in Sigmoid Colon as well as Rectosigmoid Adenocarcinoma -As  Above  -Patient received Erythromycin 1000 mg TID yesterday -CBC showed Hb/Hct decline from 15.4/42.9 -> 13.9/39.0 -> 12.9/36/3 -> 11.9/32.9 -Continue to Monitor CBC's -Type and Screen ordered in case needs to be transfused. -No Reoccurrence of bleeding; Needs Repeat Colonoscopy in 1 year -GI Signed off Case  Leukocytosis -Likely Reactive to Surgery -Patient's WBC went from 7.9 -> 15.3 -Repeat CBC in AM  Non-Thrombosed External Hemorrhoids -Chronic. -Not source of Lower GI Bleeding per GI -Continue to Monitor CBC's -Follow up with GI as an Outpatient  Hypertension -BP Medications Held because of Surgery -Continue to Monitor BP closely  Hyperlipidemia -Lipid Panel showed Cholesterol of 198, TG of 156, HDL of 24, LDL of 143, and VLDL of 31 -Patient states it was Diet Controlled. -Will likely need to Initiate Statin at some point. Patient Allergic to Niacin  Hypokalemia -Patient's K+ was 3.0 this AM -Replete with 30 mEQ MultiRun -C/w D5W 1/2 NS + 40 mEQ of Kcl -Repeat CMP in AM  Hx of Nephrolithiasis -CT Abd/Pelvis with Contrast yesterday showed Adrenals/Urinary Tract: The adrenal glands are normal. Normal appearance of the right kidney. Nonobstructing calculus identified within the inferior pole the left kidney. Cyst within the lower pole of the left kidney measures 2.4 cm, image 80 of series 3. Several smaller cysts are also identified within the upper and mid left kidney there is no mass or hydronephrosis identified. The urinary bladder appears normal  Small Lung Nodules -CT of Chest showed There are a total of 4 small (less than 5 mm) nodules identified within the Right Middle Lobe and Right Base. Their appearance is nonspecific and unfortunately there are no prior chest CTs for comparison. Close interval followup of these lymph nodes is advise. -Have Consulted Medical Oncology Dr. Marin Olp for further Evaluation and Recc's; Will likely need Neoadjuvant  Chemotherapy  Hepatic Steatosis and Low-Attenuation Structure of the Left Lobe of Liver -CT Scan showed 8 mm low-attenuation structure within the lateral segment of left lobe of liver is too small to characterize and Hepatic steatosis -Have Consulted Medical Oncology Dr. Marin Olp for Further Evaluation and Recc's -Will need Low Fat Diet once diet advanced per General Surgery  DVT prophylaxis: SCDs Code Status: FULL Family Communication: Discussed plan of care with Family at bedside.  Disposition Plan: Home Health likely; Will obtain PT Eval when patient improved  Consultants:   [facility] Dr. Paulita Fujita  General Surgery Dr. Hassell Done  Procedures: Colonoscopy; Laprascopically assisterd Rectosigmoid Removal and Anastamosis  Antimicrobials: Cefotetan 2 gram for Surgical Prophylaxis  Subjective: Seen and examined this AM and stated he was sore and had some pain. Was told about the Lung Nodules and became tearful. Told Him will consult Medical Oncology and he was in agreement. No recent bowel movement. No N/V/Abdominal Pain or Cp. No other concerns or complaints and family at bedside.     Objective: Vitals:   01/02/16 0156 01/02/16 0521 01/02/16 1000 01/02/16 1400  BP: 121/69 114/70 130/89 125/81  Pulse: 96 86 99 70  Resp: 16 16 16 16   Temp: 98.5 F (36.9 C) 97.8 F (36.6 C) 98.6 F (37 C) 98.7 F (37.1 C)  TempSrc: Oral Oral Oral Oral  SpO2: 96% 97% 96% 98%  Weight:      Height:        Intake/Output Summary (Last 24 hours) at 01/02/16 1621 Last data filed at 01/02/16 1529  Gross per 24 hour  Intake          2364.16 ml  Output  2600 ml  Net          -235.84 ml   Filed Weights   12/30/15 0844 12/30/15 1430 12/31/15 0853  Weight: 112.4 kg (247 lb 12.8 oz) 113.3 kg (249 lb 12.5 oz) 112 kg (247 lb)   Examination: Physical Exam:  Constitutional: WN/WD, NAD and appears in some pain Eyes: Lids and conjunctivae normal, sclerae anicteric  ENMT: External  Ears, Nose appear normal. Grossly normal hearing.  Neck: Appears normal, supple, no cervical masses, normal ROM, no appreciable thyromegaly, no JVD Respiratory: Clear to auscultation bilaterally, no wheezing, rales, rhonchi or crackles. Normal respiratory effort and patient is not tachypenic. No accessory muscle use.  Cardiovascular: RRR, no murmurs / rubs / gallops. S1 and S2 auscultated. No extremity edema.  Abdomen: Soft, non-tender, non-distended. No masses palpated. No appreciable hepatosplenomegaly. Bowel sounds positive. Mid Abdominal incision bandaged.   GU: Deferred. Foley in place.  Musculoskeletal: No clubbing / cyanosis of digits/nails. No joint deformity upper and lower extremities. No contractures. Normal strength and muscle tone.  Skin: No rashes, lesions, ulcers. No induration; Warm and dry.  Neurologic:  CN 2-12 grossly intact with no focal deficits. Sensation intact in all 4 Extremities. Romberg sign cerebellar reflexes not assessed.  Psychiatric: Normal judgment and insight. Alert and responsive to questioning. Depressed Mood and appropriate affect.   Data Reviewed: I have personally reviewed following labs and imaging studies  CBC:  Recent Labs Lab 12/31/15 0736 12/31/15 1353 12/31/15 2030 01/01/16 0138 01/02/16 0431  WBC 6.5 7.4 8.8 7.9 15.3*  NEUTROABS  --   --   --  4.4 13.3*  HGB 13.9 13.8 13.3 12.9* 11.9*  HCT 39.0 38.5* 36.7* 36.3* 32.9*  MCV 86.5 85.0 85.9 86.0 85.9  PLT 263 260 271 261 [id number]   Basic Metabolic Panel:  Recent Labs Lab 12/30/15 0859 12/31/15 0746 01/01/16 0138 01/02/16 0431  NA 142 141 140 140  K 3.0* 3.0* 2.8* 3.0*  CL 106 105 106 106  CO2 27 28 27 28   GLUCOSE 116* 97 104* 130*  BUN 16 15 13 13   CREATININE 0.96 0.81 0.87 0.92  CALCIUM 9.2 8.9 8.5* 8.2*  MG  --   --  2.1 2.2  PHOS  --   --  3.6 3.6   GFR: Estimated Creatinine Clearance: 126.9 mL/min (by C-G formula based on SCr of 0.92 mg/dL). Liver Function Tests:  Recent  Labs Lab 12/30/15 0859 12/31/15 0746 01/01/16 0138 01/02/16 0431  AST 27 27 26 21   ALT 42 46 42 34  ALKPHOS 70 61 60 49  BILITOT 1.1 1.0 1.2 0.8  PROT 7.7 6.8 6.5 6.1*  ALBUMIN 4.8 4.1 4.0 3.7   No results for input(s): LIPASE, AMYLASE in the last 168 hours. No results for input(s): AMMONIA in the last 168 hours. Coagulation Profile: No results for input(s): INR, PROTIME in the last 168 hours. Cardiac Enzymes: No results for input(s): CKTOTAL, CKMB, CKMBINDEX, TROPONINI in the last 168 hours. BNP (last 3 results) No results for input(s): PROBNP in the last 8760 hours. HbA1C: No results for input(s): HGBA1C in the last 72 hours. CBG: No results for input(s): GLUCAP in the last 168 hours. Lipid Profile:  Recent Labs  01/01/16 0138  CHOL 198  HDL 24*  LDLCALC 143*  TRIG 156*  CHOLHDL 8.3   Thyroid Function Tests: No results for input(s): TSH, T4TOTAL, FREET4, T3FREE, THYROIDAB in the last 72 hours. Anemia Panel: No results for input(s): VITAMINB12, FOLATE, FERRITIN, TIBC, IRON, RETICCTPCT  in the last 72 hours. Sepsis Labs: No results for input(s): PROCALCITON, LATICACIDVEN in the last 168 hours.  Recent Results (from the past 240 hour(s))  Surgical pcr screen     Status: Abnormal   Collection Time: 12/31/15 10:42 PM  Result Value Ref Range Status   MRSA, PCR NEGATIVE NEGATIVE Final   Staphylococcus aureus POSITIVE (A) NEGATIVE Final    Comment:        The Xpert SA Assay (FDA approved for NASAL specimens in patients over 65 years of age), is one component of a comprehensive surveillance program.  Test performance has been validated by [facility] for patients greater than or equal to 30 year old. It is not intended to diagnose infection nor to guide or monitor treatment.      Radiology Studies: Ct Chest W Contrast  Result Date: 12/31/2015 CLINICAL DATA:  Colon cancer.  Staging. EXAM: CT CHEST, ABDOMEN, AND PELVIS WITH CONTRAST TECHNIQUE: Multidetector CT  imaging of the chest, abdomen and pelvis was performed following the standard protocol during bolus administration of intravenous contrast. CONTRAST:  175mL ISOVUE-300 IOPAMIDOL (ISOVUE-300) INJECTION 61% COMPARISON:  None. FINDINGS: CT CHEST FINDINGS Cardiovascular: Normal heart size.  No pericardial effusion. Mediastinum/Nodes: The trachea appears patent and is midline. No mediastinal or hilar adenopathy. No axillary or supraclavicular adenopathy. Normal appearance of the esophagus. Lungs/Pleura: No pleural fluid. No airspace consolidation or atelectasis. 3 mm right middle lobe lung nodule is identified, image number 92 of series 7. Also in the right middle lobe is a 4 mm lung nodule, image 103 of series 7. 4 mm right lower lobe lung nodule is identified, image 106 of series 7. Also in the right middle lobe is a 3 mm right lower lobe lung nodule, image 109 of series 7. No lung nodules identified within the left lung. Musculoskeletal: No chest wall mass or suspicious bone lesions identified. CT ABDOMEN PELVIS FINDINGS Hepatobiliary: There is diffuse hepatic steatosis. Gallbladder appears normal. Tiny low density structure within the posterior aspect of the lateral segment of left lobe measures 8 mm, image 52 of series 3. Pancreas: Unremarkable. No pancreatic ductal dilatation or surrounding inflammatory changes. Spleen: Normal in size without focal abnormality. Adrenals/Urinary Tract: The adrenal glands are normal. Normal appearance of the right kidney. Nonobstructing calculus identified within the inferior pole the left kidney. Cyst within the lower pole of the left kidney measures 2.4 cm, image 80 of series 3. Several smaller cysts are also identified within the upper and mid left kidney there is no mass or hydronephrosis identified. The urinary bladder appears normal. Stomach/Bowel: Stomach is normal. The small bowel loops have a normal course and caliber. No pathologic dilatation of the large or small bowel  loops. Circumferential wall thickening involving the 5 cm segment of sigmoid colon is identified, image number 109 of series 3. There are several lymph nodes within the adjacent fat which measure up to 9 mm, image 106 of series 3, image 105 of series 3. Vascular/Lymphatic: Normal appearance of the abdominal aorta. No enlarged upper abdominal lymph nodes. No pathologically enlarged iliac or inguinal lymph nodes. Reproductive: Prostate is unremarkable. Other: There is no ascites or focal fluid collections within the abdomen or pelvis. Musculoskeletal: No aggressive lytic or sclerotic bone lesions identified. IMPRESSION: 1. Segment of sigmoid colon with circumferential wall thickening corresponds to abnormal colonoscopy findings from today demonstrating a malignant, partially obstructed tumor in the sigmoid colon. 2. Small lymph nodes within the surrounding fat are noted and may reflect local lymph  node metastasis. 3. No specific findings identified to suggest distant metastatic disease. 4. There are a total of 4 small (less than 5 mm) nodules identified within the right middle lobe and right base. Their appearance is nonspecific and unfortunately there are no prior chest CTs for comparison. Close interval followup of these lymph nodes is advise. 5. 8 mm low-attenuation structure within the lateral segment of left lobe of liver is too small to characterize 6. Hepatic steatosis Electronically Signed   By: Kerby Moors M.D.   On: 12/31/2015 17:27   Ct Abdomen Pelvis W Contrast  Result Date: 12/31/2015 CLINICAL DATA:  Colon cancer.  Staging. EXAM: CT CHEST, ABDOMEN, AND PELVIS WITH CONTRAST TECHNIQUE: Multidetector CT imaging of the chest, abdomen and pelvis was performed following the standard protocol during bolus administration of intravenous contrast. CONTRAST:  123mL ISOVUE-300 IOPAMIDOL (ISOVUE-300) INJECTION 61% COMPARISON:  None. FINDINGS: CT CHEST FINDINGS Cardiovascular: Normal heart size.  No pericardial  effusion. Mediastinum/Nodes: The trachea appears patent and is midline. No mediastinal or hilar adenopathy. No axillary or supraclavicular adenopathy. Normal appearance of the esophagus. Lungs/Pleura: No pleural fluid. No airspace consolidation or atelectasis. 3 mm right middle lobe lung nodule is identified, image number 92 of series 7. Also in the right middle lobe is a 4 mm lung nodule, image 103 of series 7. 4 mm right lower lobe lung nodule is identified, image 106 of series 7. Also in the right middle lobe is a 3 mm right lower lobe lung nodule, image 109 of series 7. No lung nodules identified within the left lung. Musculoskeletal: No chest wall mass or suspicious bone lesions identified. CT ABDOMEN PELVIS FINDINGS Hepatobiliary: There is diffuse hepatic steatosis. Gallbladder appears normal. Tiny low density structure within the posterior aspect of the lateral segment of left lobe measures 8 mm, image 52 of series 3. Pancreas: Unremarkable. No pancreatic ductal dilatation or surrounding inflammatory changes. Spleen: Normal in size without focal abnormality. Adrenals/Urinary Tract: The adrenal glands are normal. Normal appearance of the right kidney. Nonobstructing calculus identified within the inferior pole the left kidney. Cyst within the lower pole of the left kidney measures 2.4 cm, image 80 of series 3. Several smaller cysts are also identified within the upper and mid left kidney there is no mass or hydronephrosis identified. The urinary bladder appears normal. Stomach/Bowel: Stomach is normal. The small bowel loops have a normal course and caliber. No pathologic dilatation of the large or small bowel loops. Circumferential wall thickening involving the 5 cm segment of sigmoid colon is identified, image number 109 of series 3. There are several lymph nodes within the adjacent fat which measure up to 9 mm, image 106 of series 3, image 105 of series 3. Vascular/Lymphatic: Normal appearance of the  abdominal aorta. No enlarged upper abdominal lymph nodes. No pathologically enlarged iliac or inguinal lymph nodes. Reproductive: Prostate is unremarkable. Other: There is no ascites or focal fluid collections within the abdomen or pelvis. Musculoskeletal: No aggressive lytic or sclerotic bone lesions identified. IMPRESSION: 1. Segment of sigmoid colon with circumferential wall thickening corresponds to abnormal colonoscopy findings from today demonstrating a malignant, partially obstructed tumor in the sigmoid colon. 2. Small lymph nodes within the surrounding fat are noted and may reflect local lymph node metastasis. 3. No specific findings identified to suggest distant metastatic disease. 4. There are a total of 4 small (less than 5 mm) nodules identified within the right middle lobe and right base. Their appearance is nonspecific and unfortunately there are  no prior chest CTs for comparison. Close interval followup of these lymph nodes is advise. 5. 8 mm low-attenuation structure within the lateral segment of left lobe of liver is too small to characterize 6. Hepatic steatosis Electronically Signed   By: Kerby Moors M.D.   On: 12/31/2015 17:27   COLONOSCOPY       - Non-thrombosed external hemorrhoids found on           perianal exam. Small. Not source of bleeding. No           internal hemorrhoids.       - One 8 mm polyp in the sigmoid colon, removed with           a hot snare. Resected and retrieved.       - Likely malignant partially obstructing tumor in           the sigmoid colon. Biopsied. Tattooed.  Scheduled Meds: . heparin subcutaneous  5,000 Units Subcutaneous Q8H  . ketorolac  30 mg Intravenous Q8H  . saccharomyces boulardii  250 mg Oral BID   Continuous Infusions: . dextrose 5 % and 0.9 % NaCl with KCl 40 mEq/L 125 mL/hr at 01/02/16 1511    LOS: 2 days   Kerney Elbe, DO [facility] Pager [phone]  If 7PM-7AM, please contact  night-coverage [email or web address] Password [email or web address] 01/02/2016, 4:21 PM

## 2016-01-02 NOTE — Consult Note (Signed)
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 Rectal Bleeding  : I came in with rectal bleeding.  HPI: Vincent Strickland is a real nice 50 year old white male. He has history of hypertension. Has history of hemorrhoids and kidney stones.  He has been seen by Dr. Ronalee Red of gastroenterology. He did have a some abdominal pain over on the left side. This I think started back in September. Dr. Ronalee Red thought that a colonoscopy could be done before he was 50 years old. He actually had this planned for this week. However, Vincent Strickland was at work and had hematochezia. Again he thought this may been from his hemorrhoids.  He was told to go the emergency room. He subsequently was seen by Dr. Paulita Fujita. He had a colonoscopy done on December 6. This, unfortunately, showed a partially obstructive fungating mass in the sigmoid colon. Biopsies were done. The pathology report [id number]) showed adenocarcinoma. It was moderately differentiated.  He did undergo a CT scan. This was of the chest and abdomen. He had 4 sub- centimeter nodules in the lung. The largest was 4 mm. There is no adenopathy. The liver looked fine. He had hepatic steatosis. He had a 8 mm nodule in the liver. He had some lymph nodes surrounding the sigmoid colon mass.  A CEA level was 12.9.  He underwent laparoscopic surgery. This is done by Dr. Hassell Done. This was done on December 7. The operative report did not show any obvious metastatic disease. The final pathological report is not yet back.  He really looks great. He's been walking. He still nothing by mouth. He is on IV fluids.  He does not smoke or drink. There is a history of colon cancer in, I think, a grandparent.  He has no diabetes. He has no weight loss or weight gain.  He works in Engineer, technical sales.  He has 3 kids. He lives in [place].  Overall, his performance status is ECOG 0.    Past  Medical History:  Diagnosis Date  . Hemorrhoid   . High cholesterol   . Hypertension   . Hypogonadism male   . Renal disorder    kidney stones  :  Past Surgical History:  Procedure Laterality Date  . COLONOSCOPY WITH PROPOFOL Left 12/31/2015   Procedure: COLONOSCOPY WITH PROPOFOL;  Surgeon: Arta Silence, MD;  Location: [facility];  Service: Endoscopy;  Laterality: Left;  . LAPAROSCOPIC SIGMOID COLECTOMY N/A 01/01/2016   Procedure: LAPAROSCOPIC ASSISTED SIGMOID COLECTOMY;  Surgeon: Johnathan Hausen, MD;  Location: [facility];  Service: General;  Laterality: N/A;  . LITHOTRIPSY    . TONSILLECTOMY    :   Current Facility-Administered Medications:  .  acetaminophen-codeine (TYLENOL #3) 300-30 MG per tablet 1-2 tablet, 1-2 tablet, Oral, Q4H PRN, Earnstine Regal, PA-C, 2 tablet at 01/02/16 1729 .  dextrose 5 % and 0.9 % NaCl with KCl 40 mEq/L infusion, , Intravenous, Continuous, Earnstine Regal, PA-C, Last Rate: 125 mL/hr at 01/02/16 1511 .  fentaNYL (SUBLIMAZE) injection 12.5 mcg, 12.5 mcg, Intravenous, Q1H PRN, Johnathan Hausen, MD, 12.5 mcg at 01/02/16 0443 .  heparin injection 5,000 Units, 5,000 Units, Subcutaneous, Q8H, Johnathan Hausen, MD, 5,000 Units at 01/02/16 1350 .  ketorolac (TORADOL) 30 MG/ML injection 30 mg, 30 mg, Intravenous, Q8H, Earnstine Regal, PA-C, 30 mg at 01/02/16 1426 .  ondansetron (ZOFRAN) tablet 4 mg, 4 mg, Oral, Q6H PRN **OR** ondansetron (ZOFRAN) injection 4 mg,  4 mg, Intravenous, Q6H PRN, Johnathan Hausen, MD .  saccharomyces boulardii [facility]) capsule 250 mg, 250 mg, Oral, BID, Johnathan Hausen, MD, 250 mg at 01/02/16 [id number]:  . heparin subcutaneous  5,000 Units Subcutaneous Q8H  . ketorolac  30 mg Intravenous Q8H  . saccharomyces boulardii  250 mg Oral BID  :  Allergies  Allergen Reactions  . Niaspan [Niacin]     Severe flushing  . Percocet [Oxycodone-Acetaminophen] Nausea Only    hallucinations  :  Family History  Problem Relation Age of Onset  .  Hypertension Mother   . Cancer Father     prostate  . Migraines Father   . Hypertension Maternal Grandmother   . Diabetes Paternal Grandfather   . COPD Paternal Grandfather   :  Social History   Social History  . Marital status: Married    Spouse name: N/A  . Number of children: N/A  . Years of education: N/A   Occupational History  . Not on file.   Social History Main Topics  . Smoking status: Never Smoker  . Smokeless tobacco: Never Used  . Alcohol use No  . Drug use: No  . Sexual activity: Not on file   Other Topics Concern  . Not on file   Social History Narrative  . No narrative on file  :  Pertinent items are noted in HPI.  Exam: Patient Vitals for the past 24 hrs:  BP Temp Temp src Pulse Resp SpO2  01/02/16 1400 125/81 98.7 F (37.1 C) Oral 70 16 98 %  01/02/16 1000 130/89 98.6 F (37 C) Oral 99 16 96 %  01/02/16 0521 114/70 97.8 F (36.6 C) Oral 86 16 97 %  01/02/16 0156 121/69 98.5 F (36.9 C) Oral 96 16 96 %  01/01/16 2142 132/79 98.1 F (36.7 C) Oral 97 16 97 %  01/01/16 1925 - - - - 18 96 %    Well-developed and well-nourished white male in no obvious distress. Head exam shows no ocular or oral lesions. He has no palpable cervical or supraclavicular lymph nodes. Lungs are clear. Cardiac exam regular rate and rhythm with no murmurs, rubs or bruits. Abdomen is soft. He has the laparoscopic wounds. They're healing. Bowel sounds are present. There is no guarding or rebound tenderness. There is no fluid wave. There is no palpable liver or spleen tip. Extremities shows no clubbing, cyanosis or edema. He has good range of motion of his joints. Neurological exam shows no focal neurological deficits. Skin exam shows no rashes, ecchymosis or petechia.    Recent Labs  01/01/16 0138 01/02/16 0431  WBC 7.9 15.3*  HGB 12.9* 11.9*  HCT 36.3* 32.9*  PLT 261 286    Recent Labs  01/01/16 0138 01/02/16 0431  NA 140 140  K 2.8* 3.0*  CL 106 106  CO2 27  28  GLUCOSE 104* 130*  BUN 13 13  CREATININE 0.87 0.92  CALCIUM 8.5* 8.2*    Blood smear review:  None  Pathology: Pending     Assessment and Plan:  Vincent Strickland is a very nice 50 year old white male. He has adenocarcinoma the sigmoid colon. This is partially obstructive.  I would have to think that he does not have metastatic disease. The pulmonary nodules are way too small and totally indistinct. I think they would be too small to be seen on a PET scan. The liver lesion also would be to smallest pickup on a PET scan.  He did have the elevated  CEA level prior to surgery. It will be interesting to see what the CEA level is in about a month.  I would have to suspect that he probably has stage III disease. If so, he would be a candidate for systemic adjuvant chemotherapy.  If he has stage II disease, I would definitely send off the tumor for Oncotype assay which could assist in determining the need for adjuvant therapy.  He really looks great. He is in good spirits. He really has a good support system. He obviously had great surgeons and Dr. Hassell Done did a fantastic job.  We will have to await the final path report.  He actually lives up in [place]. As such, it would be a lot easier for him to follow-up with Dr. Whitney Muse and if he needs chemotherapy, he can receive it up there. He would certainly be okay with this.  I spent about 45 minutes with him. Again he is very nice. He is very educated and is certainly understands the situation.  Once we get the final path report, then we will talk with him again.  Vincent Haw, MD  Jeneen Rinks 1:5-7

## 2016-01-03 DIAGNOSIS — K921 Melena: Secondary | ICD-10-CM

## 2016-01-03 LAB — CBC WITH DIFFERENTIAL/PLATELET
BASOS ABS: 0 10*3/uL (ref 0.0–0.1)
Basophils Relative: 0 %
EOS PCT: 0 %
Eosinophils Absolute: 0 10*3/uL (ref 0.0–0.7)
HEMATOCRIT: 31.3 % — AB (ref 39.0–52.0)
Hemoglobin: 11 g/dL — ABNORMAL LOW (ref 13.0–17.0)
LYMPHS ABS: 2.5 10*3/uL (ref 0.7–4.0)
LYMPHS PCT: 24 %
MCH: 31.3 pg (ref 26.0–34.0)
MCHC: 35.1 g/dL (ref 30.0–36.0)
MCV: 88.9 fL (ref 78.0–100.0)
Monocytes Absolute: 0.7 10*3/uL (ref 0.1–1.0)
Monocytes Relative: 7 %
NEUTROS ABS: 7.1 10*3/uL (ref 1.7–7.7)
Neutrophils Relative %: 69 %
PLATELETS: 246 10*3/uL (ref 150–400)
RBC: 3.52 MIL/uL — AB (ref 4.22–5.81)
RDW: 14 % (ref 11.5–15.5)
WBC: 10.3 10*3/uL (ref 4.0–10.5)

## 2016-01-03 LAB — COMPREHENSIVE METABOLIC PANEL
ALK PHOS: 42 U/L (ref 38–126)
ALT: 27 U/L (ref 17–63)
AST: 24 U/L (ref 15–41)
Albumin: 3.4 g/dL — ABNORMAL LOW (ref 3.5–5.0)
Anion gap: 4 — ABNORMAL LOW (ref 5–15)
BILIRUBIN TOTAL: 0.8 mg/dL (ref 0.3–1.2)
BUN: 12 mg/dL (ref 6–20)
CALCIUM: 8 mg/dL — AB (ref 8.9–10.3)
CO2: 29 mmol/L (ref 22–32)
CREATININE: 0.88 mg/dL (ref 0.61–1.24)
Chloride: 112 mmol/L — ABNORMAL HIGH (ref 101–111)
Glucose, Bld: 111 mg/dL — ABNORMAL HIGH (ref 65–99)
Potassium: 3.6 mmol/L (ref 3.5–5.1)
Sodium: 145 mmol/L (ref 135–145)
TOTAL PROTEIN: 6 g/dL — AB (ref 6.5–8.1)

## 2016-01-03 LAB — PHOSPHORUS: PHOSPHORUS: 1.7 mg/dL — AB (ref 2.5–4.6)

## 2016-01-03 LAB — MAGNESIUM: MAGNESIUM: 2.3 mg/dL (ref 1.7–2.4)

## 2016-01-03 NOTE — Progress Notes (Signed)
Patient had dark red bloody bowel movement this am at 0600 approximately 500 cc.  Will report to oncoming nurse to monitor and report to MD

## 2016-01-03 NOTE — Progress Notes (Signed)
2 Days Post-Op  Subjective: Doing well.  No nausea.  Having some blood per rectum  Objective: Vital signs in last 24 hours: Temp:  [98.6 F (37 C)-99 F (37.2 C)] 98.6 F (37 C) (12/09 0700) Pulse Rate:  [65-83] 65 (12/09 0700) Resp:  [16-18] 18 (12/09 0700) BP: (125-156)/(78-87) 156/78 (12/09 0700) SpO2:  [96 %-99 %] 99 % (12/09 0700) Last BM Date: 12/31/15    Intake/Output from previous day: 12/08 0701 - 12/09 0700 In: 977.1 [I.V.:977.1] Out: 2600 [Urine:2600] Intake/Output this shift: No intake/output data recorded.  General appearance: alert, cooperative, no distress and anxious about surgery.  Feels better with new pain med. Resp: clear to auscultation bilaterally GI: abd soft, inc clean  Lab Results:   Recent Labs  01/02/16 0431 01/03/16 0505  WBC 15.3* 10.3  HGB 11.9* 11.0*  HCT 32.9* 31.3*  PLT 286 246    BMET  Recent Labs  01/02/16 0431 01/03/16 0505  NA 140 145  K 3.0* 3.6  CL 106 112*  CO2 28 29  GLUCOSE 130* 111*  BUN 13 12  CREATININE 0.92 0.88  CALCIUM 8.2* 8.0*   PT/INR No results for input(s): LABPROT, INR in the last 72 hours.   Recent Labs Lab 12/30/15 0859 12/31/15 0746 01/01/16 0138 01/02/16 0431 01/03/16 0505  AST 27 27 26 21 24   ALT 42 46 42 34 27  ALKPHOS 70 61 60 49 42  BILITOT 1.1 1.0 1.2 0.8 0.8  PROT 7.7 6.8 6.5 6.1* 6.0*  ALBUMIN 4.8 4.1 4.0 3.7 3.4*     Lipase  No results found for: LIPASE   Studies/Results: No results found. Prior to Admission medications   Medication Sig Start Date End Date Taking? Authorizing Provider  losartan-hydrochlorothiazide (HYZAAR) 100-25 MG tablet Take 1 tablet by mouth daily. 08/19/15  Yes Susy Frizzle, MD  potassium chloride SA (K-DUR,KLOR-CON) 20 MEQ tablet Take 1 tablet (20 mEq total) by mouth daily. 06/20/15  Yes Susy Frizzle, MD  potassium chloride (K-DUR) 10 MEQ tablet Take 1 tablet (10 mEq total) by mouth daily x 5days Patient not taking: Reported on 12/30/2015  05/08/15   Susy Frizzle, MD  pravastatin (PRAVACHOL) 20 MG tablet TAKE 1 TABLET (20 MG TOTAL) BY MOUTH DAILY. Patient not taking: Reported on 12/30/2015 11/05/15   Susy Frizzle, MD     Medications: . heparin subcutaneous  5,000 Units Subcutaneous Q8H  . ketorolac  30 mg Intravenous Q8H  . saccharomyces boulardii  250 mg Oral BID   . dextrose 5 % and 0.9 % NaCl with KCl 40 mEq/L 125 mL/hr (01/02/16 2135)    Hx of hypertension Hx of nephrolithiasis  Assessment/Plan  Rectosigmoid cancer Laparoscopically assisted low anterior resection of the rectosigmoid with #33 EEA stapled anastomosis; mobilization of the splenic flexure;  rigid endoscopy, 01/01/16, Johnathan Hausen  POD2 4 small lymph nodes, RML and RLL noted on CT Hypokalemia FEN:  IV fluids/NPO ID: Pre op abx only DVT: Heparin/SCD  Plan: clears today COnt to ambulate     LOS: 3 days    Calyn Sivils C. [phone]

## 2016-01-03 NOTE — Progress Notes (Signed)
PROGRESS NOTE    Vincent Strickland  [id number] DOB: 1965-06-10 DOA: 12/30/2015 PCP: Anthoney Harada, MD   Brief Narrative:  Vincent Strickland is a 50 y.o. male with medical history significant of external Hemorrhoids, Kidney Stones, Hypertension and other comorbids who presented to [facility] with report of a few hour history of bright red blood per rectum yesterday. He reports having episodes of bright red blood per rectum on and off for a few years and it was attributed to hemorrhoids. His primary care physician was in discussion with him regarding possible early colonoscopy. Yesterday morning, however, while at work, he had 2 episodes of red watery stools. At that time, he decided to seek medical attention and come to the ED. Since being in the emergency department, he has had 3 more bright red stools. 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 Non-thrombosed External Hemorrhoids also found on perianal exam and no internal hemorrhoids. General Surgery was then consulted for evaluation of his Sigmoid Tumor which was found to be moderately differentiated invasive Colorectal Adenocarcinoma. Patient underwent CT Chest/Abd/Pelvis with Contrast yesterday which showed the malignant partially obstructed tumor in the sigmoid colon along with small lymph nodes within in the surrounding fat which may reflect lymph node metastatsis. Patient yesterday underwent Laparoscopically assisted low anterior resection of the Rectosigmoid with stapled Anastamosis and mobilization of the splenic flexure. Medical Oncology was consulted yesterday and further decision making about the need for adjuvant chemotherapy will be made pending final pathology report. Patient had  dark maroon stools this Am.   Assessment & Plan:   Active Problems:   Essential hypertension   Pure hypercholesterolemia   Bright red blood per rectum   Hemorrhoids   GI bleed   Colon cancer (HCC)  Malignant Fungating, Infiltrative, Sessile, and Ulcerated Partially Obstructing Large Adenocarcinoma Mass in the Sigmoid Colon s/p Laprascopically Assisted Low Anterior Resection of the Rectosigmoid with Stapled Anastamosis POD 1  -Colonoscopy revealed Likely Malignant Partially Obstructed Tumor in the Sigmoid Colon with oozing present which was biopsied and tattooed with Carbon Black as well as one 8 mm polyp in the Sigmoid Colon which was removed with hot snare and resected. Non-thrombosed External Hemorrhoids also found on perianal exam and no internal hemorrhoids. -Colonoscopy Pathology results showed The findings are consistent with moderately differentiated invasive colorectal Adenocarcinoma. -Diet Advance to Clears per Patient -CEA Level was 12.9 -CT Chest/Abd/Pelvis with Contrast showed Segment of sigmoid colon with circumferential wall thickening corresponds to abnormal colonoscopy findings from today demonstrating a malignant, partially obstructed tumor in the sigmoid colon. Small lymph nodes within the surrounding fat are noted and may reflect local lymph node metastasis.  No specific findings identified to suggest distant metastatic disease -General Surgery CCS Consulted by GI for further Recc's and Evaluation; Patient underwent Surgery yesterday -Pain Control via General Surgery: C/w Acetaminophen-Codeine 300-30 mg 1-2 mg po q4hprn for Moderate Pain, Fentanyl 12.5 mcg IV q1hprn for Severe Pain, Ketorolac 30 mg IV q8h x 3 Days -Nausea Control with Zofran IV or po -Patient received Cefotetan 2 gram IV for Surgical Prophylaxis on Surgery Day; C/w Florastor 250 mg po BID per General Surgery -C/w IVF of D5W and NS with 40 mEQ mL/hr at 125 mL/hr -[place]  Dr. Marin Olp for  Recc's and Evaluation for Neoadjuvant Chemotherapy; -Oncology suspects he has Stage III Disease. Will make further decisions based on Final Surgical Pathology Report. If Needs Chemotherapy can follow up with Dr. Thelma Comp Red Blood Per Rectum/Hematochezia likely 2/2 to 8 mm Polyp in Sigmoid Colon as well as Rectosigmoid Adenocarcinoma -As Above; Had some dark Maroon Stools this AM -Patient received Erythromycin 1000 mg TID yesterday -CBC showed Hb/Hct decline from 15.4/42.9 -> 13.9/39.0 -> 12.9/36/3 -> 11.9/32.9 -> 11.0/31.3 -Continue to Monitor CBC's -Type and Screen ordered in case needs to be transfused. -Had Some dark Blood Per Rectum likely from Surgery -Will Continue to Monitor -Needs Repeat Colonoscopy in 1 year -GI Signed off Case  Leukocytosis -Improved -Likely Reactive to Surgery -Patient's WBC went from 7.9 -> 15.3 -> 10.3 -Repeat CBC in AM  Non-Thrombosed External Hemorrhoids -Chronic. -Not source of Lower GI Bleeding per GI -Continue to Monitor CBC's -Follow up with GI as an Outpatient  Hypertension -BP Medications Held because of Surgery -Continue to Monitor BP closely  Hyperlipidemia -Lipid Panel showed Cholesterol of 198, TG of 156, HDL of 24, LDL of 143, and VLDL of 31 -Patient states it was Diet Controlled. -Will likely need to Initiate Statin at some point. Patient Allergic to Niacin  Hypokalemia -Improved.  -Patient's K+ was 3.6 this AM -Replete with 30 mEQ MultiRun yesteday -C/w D5W and NS + 40 mEQ of Kcl -Repeat CMP in AM  Hx of Nephrolithiasis -CT Abd/Pelvis with Contrast yesterday showed Adrenals/Urinary Tract: The adrenal glands are normal. Normal appearance of the right kidney. Nonobstructing calculus identified within the inferior pole the left kidney. Cyst within the lower pole of the left kidney measures 2.4 cm, image 80 of series 3. Several smaller cysts are also identified within the upper and mid left kidney there is no mass or  hydronephrosis identified. The urinary bladder appears normal  Small Lung Nodules -CT of Chest showed There are a total of 4 small (less than 5 mm) nodules identified within the Right Middle Lobe and Right Base. Their appearance is nonspecific and unfortunately there are no prior chest CTs for comparison. Close interval followup of these lymph nodes is advise. -Have Consulted Medical Oncology Dr. Marin Olp for further Evaluation and Recc's and does not think this is Metastatic Disease  Hepatic Steatosis and Low-Attenuation Structure of the Left Lobe of Liver -CT Scan showed 8 mm low-attenuation structure within the lateral segment of left lobe of liver is too small to characterize and Hepatic steatosis -Have Consulted Medical Oncology Dr. Marin Olp for Further Evaluation and Recc's and he feels the Liver lesion would be to small to pick up on PET SCAN -Will need Low Fat Diet once diet advanced per General Surgery  DVT prophylaxis: SCDs Code Status: FULL Family Communication: No Family present at Bedside Disposition Plan: [place] likely; Will obtain PT Eval when patient improved  Consultants:   [facility] Dr. Paulita Fujita  General Surgery Dr. Hassell Done  Medical Oncology Dr. Marin Olp  Procedures: Colonoscopy; Laprascopically assisterd Rectosigmoid Removal and Anastamosis  Antimicrobials: Cefotetan 2 gram for Surgical Prophylaxis on day of Surgery  Subjective: Seen and examined this AM and stated he had been walking laps but felt "wiped today." Was concerned about having dark maroonish stools this AM but Surgery reassured him this was normal. Had mild Nausea but no vomiting. Was happy he was able to drink coffee last night. No CP/SOB. Stated he felt slightly dizzy walking but improved with resting. No other complaints  or concerns at this time.   Objective: Vitals:   01/02/16 1400 01/02/16 1800 01/02/16 2130 01/03/16 0700  BP: 125/81 (!) 143/87 (!) 147/81 (!) 156/78  Pulse: 70 83  67 65  Resp: 16 16 18 18   Temp: 98.7 F (37.1 C) 99 F (37.2 C) 98.6 F (37 C) 98.6 F (37 C)  TempSrc: Oral Oral Oral Oral  SpO2: 98% 96% 99% 99%  Weight:      Height:        Intake/Output Summary (Last 24 hours) at 01/03/16 1207 Last data filed at 01/03/16 1000  Gross per 24 hour  Intake           977.08 ml  Output             2100 ml  Net         -1122.92 ml   Filed Weights   12/30/15 0844 12/30/15 1430 12/31/15 0853  Weight: 112.4 kg (247 lb 12.8 oz) 113.3 kg (249 lb 12.5 oz) 112 kg (247 lb)   Examination: Physical Exam:  Constitutional: WN/WD, NAD and appears to be improved but slightly nauseous; Pleasant to speak with.  Eyes: Lids and conjunctivae normal, sclerae anicteric  ENMT: External Ears, Nose appear normal. Grossly normal hearing.  Neck: Appears normal, supple, no cervical masses, normal ROM, no appreciable thyromegaly, no JVD Respiratory: Clear to auscultation bilaterally, no wheezing, rales, rhonchi or crackles. Normal respiratory effort and patient is not tachypenic. No accessory muscle use.  Cardiovascular: RRR, no murmurs / rubs / gallops. S1 and S2 auscultated. No extremity edema.  Abdomen: Soft, non-tender, non-distended. No masses palpated. No appreciable hepatosplenomegaly. Bowel sounds positive x4. Lower Mid Abdominal incision bandaged. And is CDI.   GU: Deferred. Musculoskeletal: No clubbing / cyanosis of digits/nails. No joint deformity upper and lower extremities. No contractures. Normal strength and muscle tone.  Skin: No rashes, lesions, ulcers. No induration; Warm and dry.  Neurologic:  CN 2-12 grossly intact with no focal deficits. Sensation intact in all 4 Extremities. Romberg sign cerebellar reflexes not assessed.  Psychiatric: Normal judgment and insight. Alert and oriented x3. Tearful Mood and appropriate affect.   Data Reviewed: I have personally reviewed following labs and imaging studies  CBC:  Recent Labs Lab 12/31/15 1353  12/31/15 2030 01/01/16 0138 01/02/16 0431 01/03/16 0505  WBC 7.4 8.8 7.9 15.3* 10.3  NEUTROABS  --   --  4.4 13.3* 7.1  HGB 13.8 13.3 12.9* 11.9* 11.0*  HCT 38.5* 36.7* 36.3* 32.9* 31.3*  MCV 85.0 85.9 86.0 85.9 88.9  PLT 260 271 261 286 0000000   Basic Metabolic Panel:  Recent Labs Lab 12/30/15 0859 12/31/15 0746 01/01/16 0138 01/02/16 0431 01/03/16 0505  NA 142 141 140 140 145  K 3.0* 3.0* 2.8* 3.0* 3.6  CL 106 105 106 106 112*  CO2 27 28 27 28 29   GLUCOSE 116* 97 104* 130* 111*  BUN 16 15 13 13 12   CREATININE 0.96 0.81 0.87 0.92 0.88  CALCIUM 9.2 8.9 8.5* 8.2* 8.0*  MG  --   --  2.1 2.2 2.3  PHOS  --   --  3.6 3.6 1.7*   GFR: Estimated Creatinine Clearance: 132.7 mL/min (by C-G formula based on SCr of 0.88 mg/dL). Liver Function Tests:  Recent Labs Lab 12/30/15 0859 12/31/15 0746 01/01/16 0138 01/02/16 0431 01/03/16 0505  AST 27 27 26 21 24   ALT 42 46 42 34 27  ALKPHOS 70 61 60 49 42  BILITOT 1.1 1.0 1.2  0.8 0.8  PROT 7.7 6.8 6.5 6.1* 6.0*  ALBUMIN 4.8 4.1 4.0 3.7 3.4*   No results for input(s): LIPASE, AMYLASE in the last 168 hours. No results for input(s): AMMONIA in the last 168 hours. Coagulation Profile: No results for input(s): INR, PROTIME in the last 168 hours. Cardiac Enzymes: No results for input(s): CKTOTAL, CKMB, CKMBINDEX, TROPONINI in the last 168 hours. BNP (last 3 results) No results for input(s): PROBNP in the last 8760 hours. HbA1C: No results for input(s): HGBA1C in the last 72 hours. CBG: No results for input(s): GLUCAP in the last 168 hours. Lipid Profile:  Recent Labs  01/01/16 0138  CHOL 198  HDL 24*  LDLCALC 143*  TRIG 156*  CHOLHDL 8.3   Thyroid Function Tests: No results for input(s): TSH, T4TOTAL, FREET4, T3FREE, THYROIDAB in the last 72 hours. Anemia Panel: No results for input(s): VITAMINB12, FOLATE, FERRITIN, TIBC, IRON, RETICCTPCT in the last 72 hours. Sepsis Labs: No results for input(s): PROCALCITON,  LATICACIDVEN in the last 168 hours.  Recent Results (from the past 240 hour(s))  Surgical pcr screen     Status: Abnormal   Collection Time: 12/31/15 10:42 PM  Result Value Ref Range Status   MRSA, PCR NEGATIVE NEGATIVE Final   Staphylococcus aureus POSITIVE (A) NEGATIVE Final    Comment:        The Xpert SA Assay (FDA approved for NASAL specimens in patients over 82 years of age), is one component of a comprehensive surveillance program.  Test performance has been validated by [facility] for patients greater than or equal to 34 year old. It is not intended to diagnose infection nor to guide or monitor treatment.      Radiology Studies: No results found. COLONOSCOPY       - Non-thrombosed external hemorrhoids found on           perianal exam. Small. Not source of bleeding. No           internal hemorrhoids.       - One 8 mm polyp in the sigmoid colon, removed with           a hot snare. Resected and retrieved.       - Likely malignant partially obstructing tumor in           the sigmoid colon. Biopsied. Tattooed.  Scheduled Meds: . heparin subcutaneous  5,000 Units Subcutaneous Q8H  . ketorolac  30 mg Intravenous Q8H  . saccharomyces boulardii  250 mg Oral BID   Continuous Infusions: . dextrose 5 % and 0.9 % NaCl with KCl 40 mEq/L 125 mL/hr (01/02/16 2135)    LOS: 3 days   Kerney Elbe, DO [facility] Pager [phone]  If 7PM-7AM, please contact night-coverage [email or web address] Password [email or web address] 01/03/2016, 12:07 PM

## 2016-01-04 LAB — CBC WITH DIFFERENTIAL/PLATELET
BASOS ABS: 0 10*3/uL (ref 0.0–0.1)
BASOS PCT: 0 %
Eosinophils Absolute: 0 10*3/uL (ref 0.0–0.7)
Eosinophils Relative: 0 %
HEMATOCRIT: 29.7 % — AB (ref 39.0–52.0)
Hemoglobin: 10.4 g/dL — ABNORMAL LOW (ref 13.0–17.0)
Lymphocytes Relative: 20 %
Lymphs Abs: 2 10*3/uL (ref 0.7–4.0)
MCH: 31.4 pg (ref 26.0–34.0)
MCHC: 35 g/dL (ref 30.0–36.0)
MCV: 89.7 fL (ref 78.0–100.0)
MONO ABS: 0.7 10*3/uL (ref 0.1–1.0)
Monocytes Relative: 6 %
NEUTROS ABS: 7.3 10*3/uL (ref 1.7–7.7)
NEUTROS PCT: 74 %
Platelets: 248 10*3/uL (ref 150–400)
RBC: 3.31 MIL/uL — ABNORMAL LOW (ref 4.22–5.81)
RDW: 13.9 % (ref 11.5–15.5)
WBC: 10.1 10*3/uL (ref 4.0–10.5)

## 2016-01-04 LAB — COMPREHENSIVE METABOLIC PANEL
ALBUMIN: 3.5 g/dL (ref 3.5–5.0)
ALT: 25 U/L (ref 17–63)
AST: 20 U/L (ref 15–41)
Alkaline Phosphatase: 46 U/L (ref 38–126)
Anion gap: 6 (ref 5–15)
BILIRUBIN TOTAL: 0.7 mg/dL (ref 0.3–1.2)
BUN: 9 mg/dL (ref 6–20)
CHLORIDE: 111 mmol/L (ref 101–111)
CO2: 25 mmol/L (ref 22–32)
Calcium: 8.3 mg/dL — ABNORMAL LOW (ref 8.9–10.3)
Creatinine, Ser: 0.8 mg/dL (ref 0.61–1.24)
GFR calc Af Amer: >60 mL/min (ref >60)
GFR calc non Af Amer: >60 mL/min (ref >60)
GLUCOSE: 106 mg/dL — AB (ref 65–99)
POTASSIUM: 3.2 mmol/L — AB (ref 3.5–5.1)
Sodium: 142 mmol/L (ref 135–145)
TOTAL PROTEIN: 6.1 g/dL — AB (ref 6.5–8.1)

## 2016-01-04 LAB — MAGNESIUM: Magnesium: 1.9 mg/dL (ref 1.7–2.4)

## 2016-01-04 LAB — PHOSPHORUS: Phosphorus: 2 mg/dL — ABNORMAL LOW (ref 2.5–4.6)

## 2016-01-04 MED ORDER — POTASSIUM CHLORIDE 2 MEQ/ML IV SOLN
30.0000 meq | Freq: Once | INTRAVENOUS | Status: AC
  Administered 2016-01-04: 30 meq via INTRAVENOUS
  Filled 2016-01-04: qty 15

## 2016-01-04 MED ORDER — SODIUM CHLORIDE 0.9% FLUSH: 3.0000 mL | INTRAVENOUS | Status: DC | PRN

## 2016-01-04 MED ORDER — SODIUM CHLORIDE 0.9% FLUSH
3.0000 mL | Freq: Two times a day (BID) | INTRAVENOUS | Status: DC
Start: 2016-01-04 — End: 2016-01-05
  Administered 2016-01-04 – 2016-01-05 (×3): 3 mL via INTRAVENOUS

## 2016-01-04 NOTE — Progress Notes (Signed)
3 Days Post-Op  Subjective: Doing well.  No nausea.  Having BM's  Objective: Vital signs in last 24 hours: Temp:  [98.7 F (37.1 C)-99.9 F (37.7 C)] 98.7 F (37.1 C) (12/10 0558) Pulse Rate:  [72-100] 72 (12/10 0558) Resp:  [16-18] 16 (12/10 0558) BP: (111-140)/(67-87) 140/87 (12/10 0558) SpO2:  [98 %-100 %] 100 % (12/10 0558) Last BM Date: 01/04/16    Intake/Output from previous day: 12/09 0701 - 12/10 0700 In: 4970 [P.O.:1095; I.V.:3875] Out: 4370 [Urine:3970; Stool:400] Intake/Output this shift: No intake/output data recorded.  General appearance: alert, cooperative   Resp: clear to auscultation bilaterally GI: abd soft, inc clean  Lab Results:   Recent Labs  01/03/16 0505 01/04/16 0500  WBC 10.3 10.1  HGB 11.0* 10.4*  HCT 31.3* 29.7*  PLT 246 248    BMET  Recent Labs  01/03/16 0505 01/04/16 0500  NA 145 142  K 3.6 3.2*  CL 112* 111  CO2 29 25  GLUCOSE 111* 106*  BUN 12 9  CREATININE 0.88 0.80  CALCIUM 8.0* 8.3*   PT/INR No results for input(s): LABPROT, INR in the last 72 hours.   Recent Labs Lab 12/31/15 0746 01/01/16 0138 01/02/16 0431 01/03/16 0505 01/04/16 0500  AST 27 26 21 24 20   ALT 46 42 34 27 25  ALKPHOS 61 60 49 42 46  BILITOT 1.0 1.2 0.8 0.8 0.7  PROT 6.8 6.5 6.1* 6.0* 6.1*  ALBUMIN 4.1 4.0 3.7 3.4* 3.5     Lipase  No results found for: LIPASE   Studies/Results: No results found. Prior to Admission medications   Medication Sig Start Date End Date Taking? Authorizing Provider  losartan-hydrochlorothiazide (HYZAAR) 100-25 MG tablet Take 1 tablet by mouth daily. 08/19/15  Yes Susy Frizzle, MD  potassium chloride SA (K-DUR,KLOR-CON) 20 MEQ tablet Take 1 tablet (20 mEq total) by mouth daily. 06/20/15  Yes Susy Frizzle, MD  potassium chloride (K-DUR) 10 MEQ tablet Take 1 tablet (10 mEq total) by mouth daily x 5days Patient not taking: Reported on 12/30/2015 05/08/15   Susy Frizzle, MD  pravastatin (PRAVACHOL) 20 MG  tablet TAKE 1 TABLET (20 MG TOTAL) BY MOUTH DAILY. Patient not taking: Reported on 12/30/2015 11/05/15   Susy Frizzle, MD     Medications: . heparin subcutaneous  5,000 Units Subcutaneous Q8H  . ketorolac  30 mg Intravenous Q8H  . potassium chloride (KCL MULTIRUN) 30 mEq in 265 mL IVPB  30 mEq Intravenous Once  . saccharomyces boulardii  250 mg Oral BID   . 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 [id number]

## 2016-01-04 NOTE — Progress Notes (Signed)
PROGRESS NOTE    Vincent Strickland  [id number] DOB: July 11, 1965 DOA: 12/30/2015 PCP: Anthoney Harada, MD   Brief Narrative:  Vincent Strickland is a 50 y.o. male with medical history significant of external Hemorrhoids, Kidney Stones, Hypertension and other comorbids who presented to [facility] with report of a few hour history of bright red blood per rectum yesterday. He reports having episodes of bright red blood per rectum on and off for a few years and it was attributed to hemorrhoids. His primary care physician was in discussion with him regarding possible early colonoscopy. Yesterday morning, however, while at work, he had 2 episodes of red watery stools. At that time, he decided to seek medical attention and come to the ED. Since being in the emergency department, he has had 3 more bright red stools. 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 Non-thrombosed External Hemorrhoids also found on perianal exam and no internal hemorrhoids. General Surgery was then consulted for evaluation of his Sigmoid Tumor which was found to be moderately differentiated invasive Colorectal Adenocarcinoma. Patient underwent CT Chest/Abd/Pelvis with Contrast yesterday which showed the malignant partially obstructed tumor in the sigmoid colon along with small lymph nodes within in the surrounding fat which may reflect lymph node metastatsis. Patient underwent Laparoscopically assisted low anterior resection of the Rectosigmoid with stapled Anastamosis and mobilization of the splenic flexure on 01/01/2016. Medical Oncology was consulted and further decision making about the need for adjuvant chemotherapy will be made pending final pathology report. Patient had dark  maroon stools yesterday Am and feels good today and had no reoccurrence of blood in his stools.   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 Non-thrombosed External Hemorrhoids also found on perianal exam and no internal hemorrhoids. -Colonoscopy Pathology results showed The findings are consistent with moderately differentiated invasive colorectal Adenocarcinoma. -Diet Advance to Soft Diet per Generally Surgery -CEA Level was 12.9 -CT Chest/Abd/Pelvis with Contrast showed Segment of sigmoid colon with circumferential wall thickening corresponds to abnormal colonoscopy findings from today demonstrating a malignant, partially obstructed tumor in the sigmoid colon. Small lymph nodes within the surrounding fat are noted and may reflect local lymph node metastasis.  No specific findings identified to suggest distant metastatic disease -General Surgery CCS Consulted by GI for further Recc's and Evaluation; Patient underwent Surgery on 01/01/2016 -Pain Control via General Surgery: C/w Acetaminophen-Codeine 300-30 mg 1-2 mg po q4hprn for Moderate Pain, Fentanyl 12.5 mcg IV q1hprn for Severe Pain, Ketorolac 30 mg IV q8h x 3 Days -Nausea Control with Zofran IV or po -Patient received Cefotetan 2 gram IV for Surgical Prophylaxis on Surgery Day; C/w Florastor 250 mg po BID per General Surgery -D/C'd IVF  of D5W  and NS with 40 mEQ mL/hr at 125 mL/hr -Consulted Medical Oncology Dr. Marin Olp for Recc's and Evaluation for Neoadjuvant Chemotherapy; -Oncology suspects he has Stage III Disease. Will make further decisions based on Final Surgical Pathology Report. If Needs Chemotherapy can follow up with Dr. Thelma Comp Red Blood Per Rectum/Hematochezia likely 2/2 to 8 mm Polyp in Sigmoid Colon as well as Rectosigmoid Adenocarcinoma -As Above; -Patient received Erythromycin 1000 mg TID yesterday -CBC showed Hb/Hct decline from 15.4/42.9 -> 13.9/39.0 -> 12.9/36/3 -> 11.9/32.9 -> 11.0/31.3 -> 10.4/29.7 *likely Dilutional from all the IVF patient has received.  -Continue to Monitor CBC's -Type and Screen ordered in case needs to be transfused. -Had Some dark Blood Per Rectum likely from Surgery -Will Continue to Monitor -Needs Repeat Colonoscopy in 1 year -GI Signed off Case  Leukocytosis -Improved -Likely Reactive to Surgery -Patient's WBC went from 7.9 -> 15.3 -> 10.3 -> 10.1 -Repeat CBC in AM  Non-Thrombosed External Hemorrhoids -Chronic. -Not source of Lower GI Bleeding per GI -Continue to Monitor CBC's -Follow up with GI as an Outpatient  Hypertension -BP Medicans have been ranging from 111-156 SBP and 67-87 DBP -BP Medications Held because of Surgery; Will add back slowly and Start in AM -Continue to Monitor BP closely  Hyperlipidemia -Lipid Panel showed Cholesterol of 198, TG of 156, HDL of 24, LDL of 143, and VLDL of 31 -Patient states it was Diet Controlled. -Will likely need to Initiate Statin at some point. Patient Allergic to Niacin  Hypokalemia -Patient's K+ was 3.2 this AM -Replete with 30 mEQ MultiRun  -D/C'd D5W and NS + 40 mEQ of Kcl -Repeat CMP in AM  Hx of Nephrolithiasis -CT Abd/Pelvis with Contrast yesterday showed Adrenals/Urinary Tract: The adrenal glands are normal. Normal appearance of the right kidney. Nonobstructing calculus identified within the inferior  pole the left kidney. Cyst within the lower pole of the left kidney measures 2.4 cm, image 80 of series 3. Several smaller cysts are also identified within the upper and mid left kidney there is no mass or hydronephrosis identified. The urinary bladder appears normal  Small Lung Nodules -CT of Chest showed There are a total of 4 small (less than 5 mm) nodules identified within the Right Middle Lobe and Right Base. Their appearance is nonspecific and unfortunately there are no prior chest CTs for comparison. Close interval followup of these lymph nodes is advise. -Have Consulted Medical Oncology Dr. Marin Olp for further Evaluation and Recc's and does not think this is Metastatic Disease  Hepatic Steatosis and Low-Attenuation Structure of the Left Lobe of Liver -CT Scan showed 8 mm low-attenuation structure within the lateral segment of left lobe of liver is too small to characterize and Hepatic steatosis -Have Consulted Medical Oncology Dr. Marin Olp for Further Evaluation and Recc's and he feels the Liver lesion would be to small to pick up on PET SCAN -Will need Low Fat Diet once diet advanced per General Surgery  DVT prophylaxis: SCDs Code Status: FULL Family Communication: No Family present at Bedside Disposition Plan: [place] likely; Will obtain PT Eval when patient improved  Consultants:   [facility] Dr. Paulita Fujita  General Surgery Dr. Hassell Done  Medical Oncology Dr. Marin Olp  Procedures: Colonoscopy; Laprascopically assisterd Rectosigmoid Removal and Anastamosis  Antimicrobials: Cefotetan 2 gram for Surgical Prophylaxis on day of Surgery on 12/02/2015  Subjective: Seen and examined this AM and stated he felt much better than yesterday and was doing well and stated he was walking around  doing laps without problems. Surgery advanced Diet to Soft Diet. Bowel Movements with no blood today per patient. No other concerns or complaints at this time.    Objective: Vitals:    01/03/16 0700 01/03/16 1400 01/03/16 2045 01/04/16 0558  BP: (!) 156/78 134/86 111/67 140/87  Pulse: 65 98 100 72  Resp: 18 18 18 16   Temp: 98.6 F (37 C) 99.9 F (37.7 C) 99.3 F (37.4 C) 98.7 F (37.1 C)  TempSrc: Oral Oral Oral Oral  SpO2: 99% 98% 98% 100%  Weight:      Height:        Intake/Output Summary (Last 24 hours) at 01/04/16 1114 Last data filed at 01/04/16 0600  Gross per 24 hour  Intake             4970 ml  Output             4170 ml  Net              800 ml   Filed Weights   12/30/15 0844 12/30/15 1430 12/31/15 0853  Weight: 112.4 kg (247 lb 12.8 oz) 113.3 kg (249 lb 12.5 oz) 112 kg (247 lb)   Examination: Physical Exam:  Constitutional: WN/WD, NAD and appears calm and comfortable. Pleasant to speak with.  Eyes: Lids and conjunctivae normal, sclerae anicteric  ENMT: External Ears, Nose appear normal. Grossly normal hearing.  Neck: Appears normal, supple, no cervical masses, normal ROM, no appreciable thyromegaly, no JVD Respiratory: Clear to auscultation bilaterally, no wheezing, rales, rhonchi or crackles. Normal respiratory effort and patient is not tachypenic. No accessory muscle use.  Cardiovascular: RRR, no murmurs / rubs / gallops. S1 and S2 auscultated. No extremity edema.  Abdomen: Soft, non-tender, non-distended. No masses palpated. No appreciable hepatosplenomegaly. Bowel sounds positive x4. Lower Mid Abdominal incision with staples are C/D/I.   GU: Deferred. Musculoskeletal: No clubbing / cyanosis of digits/nails. No joint deformity upper and lower extremities. No contractures. Normal strength and muscle tone.  Skin: No rashes, lesions, ulcers. No induration; Warm and dry.  Neurologic:  CN 2-12 grossly intact with no focal deficits. Sensation intact in all 4 Extremities. Romberg sign cerebellar reflexes not assessed.  Psychiatric: Normal judgment and insight. Alert and oriented x3. Normal mood and appropriate affect.   Data Reviewed: I have  personally reviewed following labs and imaging studies  CBC:  Recent Labs Lab 12/31/15 2030 01/01/16 0138 01/02/16 0431 01/03/16 0505 01/04/16 0500  WBC 8.8 7.9 15.3* 10.3 10.1  NEUTROABS  --  4.4 13.3* 7.1 7.3  HGB 13.3 12.9* 11.9* 11.0* 10.4*  HCT 36.7* 36.3* 32.9* 31.3* 29.7*  MCV 85.9 86.0 85.9 88.9 89.7  PLT 271 261 286 246 [id number]   Basic Metabolic Panel:  Recent Labs Lab 12/31/15 0746 01/01/16 0138 01/02/16 0431 01/03/16 0505 01/04/16 0500  NA 141 140 140 145 142  K 3.0* 2.8* 3.0* 3.6 3.2*  CL 105 106 106 112* 111  CO2 28 27 28 29 25   GLUCOSE 97 104* 130* 111* 106*  BUN 15 13 13 12 9   CREATININE 0.81 0.87 0.92 0.88 0.80  CALCIUM 8.9 8.5* 8.2* 8.0* 8.3*  MG  --  2.1 2.2 2.3 1.9  PHOS  --  3.6 3.6 1.7* 2.0*   GFR: Estimated Creatinine Clearance: 146 mL/min (by C-G formula based on SCr of 0.8 mg/dL). Liver Function Tests:  Recent Labs Lab 12/31/15 0746 01/01/16 0138 01/02/16 0431 01/03/16 0505 01/04/16 0500  AST 27 26 21 24  20  ALT 46 42 34 27 25  ALKPHOS 61 60 49 42 46  BILITOT 1.0 1.2 0.8 0.8 0.7  PROT 6.8 6.5 6.1* 6.0* 6.1*  ALBUMIN 4.1 4.0 3.7 3.4* 3.5   No results for input(s): LIPASE, AMYLASE in the last 168 hours. No results for input(s): AMMONIA in the last 168 hours. Coagulation Profile: No results for input(s): INR, PROTIME in the last 168 hours. Cardiac Enzymes: No results for input(s): CKTOTAL, CKMB, CKMBINDEX, TROPONINI in the last 168 hours. BNP (last 3 results) No results for input(s): PROBNP in the last 8760 hours. HbA1C: No results for input(s): HGBA1C in the last 72 hours. CBG: No results for input(s): GLUCAP in the last 168 hours. Lipid Profile: No results for input(s): CHOL, HDL, LDLCALC, TRIG, CHOLHDL, LDLDIRECT in the last 72 hours. Thyroid Function Tests: No results for input(s): TSH, T4TOTAL, FREET4, T3FREE, THYROIDAB in the last 72 hours. Anemia Panel: No results for input(s): VITAMINB12, FOLATE, FERRITIN, TIBC, IRON,  RETICCTPCT in the last 72 hours. Sepsis Labs: No results for input(s): PROCALCITON, LATICACIDVEN in the last 168 hours.  Recent Results (from the past 240 hour(s))  Surgical pcr screen     Status: Abnormal   Collection Time: 12/31/15 10:42 PM  Result Value Ref Range Status   MRSA, PCR NEGATIVE NEGATIVE Final   Staphylococcus aureus POSITIVE (A) NEGATIVE Final    Comment:        The Xpert SA Assay (FDA approved for NASAL specimens in patients over 85 years of age), is one component of a comprehensive surveillance program.  Test performance has been validated by [facility] for patients greater than or equal to 22 year old. It is not intended to diagnose infection nor to guide or monitor treatment.      Radiology Studies: No results found. COLONOSCOPY       - Non-thrombosed external hemorrhoids found on           perianal exam. Small. Not source of bleeding. No           internal hemorrhoids.       - One 8 mm polyp in the sigmoid colon, removed with           a hot snare. Resected and retrieved.       - Likely malignant partially obstructing tumor in           the sigmoid colon. Biopsied. Tattooed.  Scheduled Meds: . heparin subcutaneous  5,000 Units Subcutaneous Q8H  . ketorolac  30 mg Intravenous Q8H  . potassium chloride (KCL MULTIRUN) 30 mEq in 265 mL IVPB  30 mEq Intravenous Once  . saccharomyces boulardii  250 mg Oral BID  . sodium chloride flush  3 mL Intravenous Q12H   Continuous Infusions:   LOS: 4 days   Kerney Elbe, DO [facility] Pager [phone]  If 7PM-7AM, please contact night-coverage [email or web address] Password [email or web address] 01/04/2016, 11:14 AM

## 2016-01-05 LAB — COMPREHENSIVE METABOLIC PANEL
ALBUMIN: 3.6 g/dL (ref 3.5–5.0)
ALK PHOS: 49 U/L (ref 38–126)
ALT: 30 U/L (ref 17–63)
ANION GAP: 6 (ref 5–15)
AST: 20 U/L (ref 15–41)
BILIRUBIN TOTAL: 1 mg/dL (ref 0.3–1.2)
BUN: 12 mg/dL (ref 6–20)
CALCIUM: 8.5 mg/dL — AB (ref 8.9–10.3)
CO2: 28 mmol/L (ref 22–32)
Chloride: 107 mmol/L (ref 101–111)
Creatinine, Ser: 0.82 mg/dL (ref 0.61–1.24)
GFR calc Af Amer: >60 mL/min (ref >60)
GFR calc non Af Amer: >60 mL/min (ref >60)
GLUCOSE: 99 mg/dL (ref 65–99)
Potassium: 3.1 mmol/L — ABNORMAL LOW (ref 3.5–5.1)
SODIUM: 141 mmol/L (ref 135–145)
TOTAL PROTEIN: 6.4 g/dL — AB (ref 6.5–8.1)

## 2016-01-05 LAB — CBC WITH DIFFERENTIAL/PLATELET
BASOS ABS: 0 10*3/uL (ref 0.0–0.1)
BASOS PCT: 0 %
EOS ABS: 0.2 10*3/uL (ref 0.0–0.7)
Eosinophils Relative: 3 %
HEMATOCRIT: 29.1 % — AB (ref 39.0–52.0)
HEMOGLOBIN: 10.7 g/dL — AB (ref 13.0–17.0)
Lymphocytes Relative: 24 %
Lymphs Abs: 2.1 10*3/uL (ref 0.7–4.0)
MCH: 31.2 pg (ref 26.0–34.0)
MCHC: 36.8 g/dL — AB (ref 30.0–36.0)
MCV: 84.8 fL (ref 78.0–100.0)
Monocytes Absolute: 0.5 10*3/uL (ref 0.1–1.0)
Monocytes Relative: 6 %
NEUTROS ABS: 5.8 10*3/uL (ref 1.7–7.7)
NEUTROS PCT: 67 %
Platelets: 253 10*3/uL (ref 150–400)
RBC: 3.43 MIL/uL — AB (ref 4.22–5.81)
RDW: 13.5 % (ref 11.5–15.5)
WBC: 8.6 10*3/uL (ref 4.0–10.5)

## 2016-01-05 LAB — PHOSPHORUS: Phosphorus: 3.4 mg/dL (ref 2.5–4.6)

## 2016-01-05 LAB — MAGNESIUM: Magnesium: 2.2 mg/dL (ref 1.7–2.4)

## 2016-01-05 MED ORDER — LOSARTAN POTASSIUM 50 MG PO TABS
100.0000 mg | ORAL_TABLET | Freq: Every day | ORAL | Status: DC
  Administered 2016-01-05: 100 mg via ORAL
  Filled 2016-01-05: qty 2

## 2016-01-05 MED ORDER — HYDROCHLOROTHIAZIDE 25 MG PO TABS
25.0000 mg | ORAL_TABLET | Freq: Every day | ORAL | Status: DC
  Administered 2016-01-05: 25 mg via ORAL
  Filled 2016-01-05: qty 1

## 2016-01-05 MED ORDER — LOSARTAN POTASSIUM-HCTZ 100-25 MG PO TABS: 1.0000 | ORAL_TABLET | Freq: Every day | ORAL | Status: DC

## 2016-01-05 MED ORDER — POTASSIUM CHLORIDE CRYS ER 20 MEQ PO TBCR
40.0000 meq | EXTENDED_RELEASE_TABLET | Freq: Two times a day (BID) | ORAL | Status: DC
  Administered 2016-01-05: 40 meq via ORAL
  Filled 2016-01-05: qty 2

## 2016-01-05 MED ORDER — ACETAMINOPHEN-CODEINE #3 300-30 MG PO TABS: 1.0000 | ORAL_TABLET | ORAL | 0 refills | Status: DC | PRN

## 2016-01-05 MED ORDER — SACCHAROMYCES BOULARDII 250 MG PO CAPS: 250.0000 mg | ORAL_CAPSULE | Freq: Two times a day (BID) | ORAL | 0 refills | Status: DC

## 2016-01-05 NOTE — Discharge Summary (Signed)
Physician Discharge Summary  Vincent Strickland [id number] DOB: Nov 05, 1965 DOA: 12/30/2015  PCP: Anthoney Harada, MD  Admit date: 12/30/2015 Discharge date: 01/05/2016  Admitted From: Home Disposition:  Home  Recommendations for Outpatient Follow-up:  1. Follow up with PCP Dr. Jacelyn Grip  in 1-2 weeks 2. Follow up with Dr. Johnathan Hausen in General Surgery with visit in 1-2 weeks 3. Follow up with Dr. Marin Olp in Oncology in 1 week 4. Discuss with Primary about possible repeat CT Scan for Lung Nodules 5. Please obtain BMP/CBC in one week 6. Please follow up on the following pending results: [place]: No Equipment/Devices: None  Discharge Condition: Stable CODE STATUS: FULL Diet recommendation: Heart Healthy / Carb-Modified Low Fat Diet  Brief/Interim Summary: Vincent McBrideis a 50 y.o.malewith medical history significant of external Hemorrhoids, Kidney Stones, Hypertension and other comorbids who presented to [facility] with report of a few hour history of bright red blood per rectum yesterday. He reports having episodes of bright red blood per rectum on and off for a few years and it was attributed tohemorrhoids. His primary care physician was in discussion with him regarding possible early colonoscopy. Yesterday morning, however, while at work, he had 2 episodes of red watery stools. At that time, he decided to seek medical attention and come to the ED. Since being in the emergency department, he has had 3 more bright red stools. 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 Non-thrombosed External Hemorrhoids also found on perianal exam and no internal hemorrhoids. General Surgery was then consulted for  evaluation of his Sigmoid Tumor which was found to be moderately differentiated invasive Colorectal Adenocarcinoma. Patient underwent CT Chest/Abd/Pelvis with Contrast yesterday which showed the malignant partially obstructed tumor in the sigmoid colon along with small lymph nodes within in the surrounding fat which may reflect lymph node metastatsis. Patient underwent Laparoscopically assisted low anterior resection of the Rectosigmoid with stapled Anastamosis and mobilization of the splenic flexure on 01/01/2016. Medical Oncology was consulted and further decision making about the need for adjuvant chemotherapy will be made pending final pathology report. Patient had dark maroon stools yesterday 1/03/06/2015  had no reoccurrence of blood in his stool yesterday or today. Final Surgical Pathology Results are still pending but Surgery will call him with Results. At this time patient is medically stable to be D/C'd and follow up with PCP, General Surgery, and Oncology as an outpatient.   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 Non-thrombosed External Hemorrhoids also found on perianal exam and no internal hemorrhoids. -Colonoscopy Pathology results showed The findings are consistent with moderately differentiated invasive colorectal Adenocarcinoma. -CEA Level was 12.9 -CT Chest/Abd/Pelvis with Contrast showed  Segment of sigmoid colon with circumferential wall thickening corresponds to abnormal colonoscopy findings from today demonstrating a malignant, partially obstructed tumor in the sigmoid colon. Small lymph nodes within the surrounding fat are noted and may reflect local lymph node metastasis.  No specific findings identified to suggest distant metastatic disease -General Surgery CCS Consulted by GI for further Recc's and Evaluation; Patient underwent Surgery on 01/01/2016 -Pain Control via General Surgery: C/w Acetaminophen-Codeine 300-30 mg at D/C -Patient received Cefotetan 2 gram IV for Surgical Prophylaxis on Surgery Day; C/w Florastor 250 mg po BID per General Surgery -Consulted Medical Oncology Dr. Marin Olp for Recc's and Evaluation for Neoadjuvant Chemotherapy; -Oncology suspects he has Stage III Disease. Will make further decisions based on Final Surgical Pathology Report. If Needs Chemotherapy can follow up with Dr. Whitney Muse -Follow up Path Results as an Outpatient -Follow up with Surgery Dr. Hassell Done in 1-2 weeks -Follow up with PCP for After Hospital D/C  Bright Red Blood Per Rectum/Hematochezia likely 2/2 to 8 mm Polyp in Sigmoid Colon as well as Rectosigmoid Adenocarcinoma, Resolved -As Above; -Patient received Erythromycin 1000 mg TID while in the Hospital -CBC showed Hb/Hct decline from 15.4/42.9 -> 13.9/39.0 -> 12.9/36/3 -> 11.9/32.9 -> 11.0/31.3 -> 10.4/29.7 -> 10.7/29.1*likely Dilutional from all the IVF patient has received.  -Continue to Monitor CBC as an outpatient.  -Needs Repeat Colonoscopy in 1 year -GI Signed off Case  Leukocytosis -Improved -Likely Reactive to Surgery -Patient's WBC went from 7.9 -> 15.3 -> 10.3 -> 10.1 -> 8.6 -Repeat CBC as an outpatient  Non-Thrombosed External Hemorrhoids -Chronic. -Not source of Lower GI Bleeding per GI -Continue to Monitor CBC's as an outpatient -Follow up with GI as an Outpatient  Hypertension -C/w Home Blood Pressure  Meds  Hyperlipidemia -Lipid Panel showed Cholesterol of 198, TG of 156, HDL of 24, LDL of 143, and VLDL of 31 -Will likely need to Initiate Statin at some point. Patient Allergic to Niacin. Discussion with PCP as an outpatient  Hypokalemia -Patient's K+ was 3.1 this AM -Replete with [place]  -C/w Home Klor-Con -Repeat CMP as an outpatient  Hx of Nephrolithiasis -CT Abd/Pelvis with Contrast yesterday showed Adrenals/Urinary Tract: The adrenal glands are normal. Normal appearance of the right kidney. Nonobstructing calculus identified within the inferior pole the left kidney. Cyst within the lower pole of the left kidney measures 2.4 cm, image 80 of series 3. Several smaller cysts are also identified within the upper and mid left kidney there is no mass or hydronephrosis identified. The urinary bladder appears normal -Continue to Monitor  Small Lung Nodules -CT of Chest showed There are a total of 4 small (less than 5 mm) nodules identified within the Right Middle Lobe and Right Base. Their appearance is nonspecific and unfortunately there are no prior chest CTs for comparison. Close interval followup of these lymph nodes is advised -Have Consulted Medical Oncology Dr. Marin Olp for further Evaluation and Recc's and does not think this is Metastatic Disease. -Repeat Chest CT as an outpatient per Primary  Hepatic Steatosis and Low-Attenuation Structure of the Left Lobe of Liver -CT Scan showed 8 mm low-attenuation structure within the lateral segment of left lobe of liver is too small to characterize and Hepatic steatosis -Have Consulted Medical Oncology Dr. Marin Olp for Further Evaluation and Recc's and he feels the Liver lesion would be to small to pick up on PET SCAN -Will need Low Fat Diet once diet advanced per General Surgery  Discharge Instructions  Discharge Instructions  Call MD for:  difficulty breathing, headache or visual disturbances    Complete by:  As  directed    Call MD for:  persistant dizziness or light-headedness    Complete by:  As directed    Call MD for:  persistant nausea and vomiting    Complete by:  As directed    Call MD for:  redness, tenderness, or signs of infection (pain, swelling, redness, odor or green/yellow discharge around incision site)    Complete by:  As directed    Call MD for:  temperature >100.4    Complete by:  As directed    Diet - low sodium heart healthy    Complete by:  As directed    Discharge instructions    Complete by:  As directed    Follow up with PCP, Dr. Hassell Done in Surgery, and with Dr. Marin Olp in Oncology. Please take all medications as prescribed. If symptoms change or worsen please go to ER for Evaluation.   Increase activity slowly    Complete by:  As directed        Medication List    STOP taking these medications   potassium chloride 10 MEQ tablet Commonly known as:  K-DUR   pravastatin 20 MG tablet Commonly known as:  PRAVACHOL     TAKE these medications   acetaminophen-codeine 300-30 MG tablet Commonly known as:  TYLENOL #3 Take 1-2 tablets by mouth every 4 (four) hours as needed for moderate pain.   losartan-hydrochlorothiazide 100-25 MG tablet Commonly known as:  HYZAAR Take 1 tablet by mouth daily.   potassium chloride SA 20 MEQ tablet Commonly known as:  K-DUR,KLOR-CON Take 1 tablet (20 mEq total) by mouth daily.   saccharomyces boulardii 250 MG capsule Commonly known as:  FLORASTOR Take 1 capsule (250 mg total) by mouth 2 (two) times daily.      Follow-up Information    MARTIN,MATTHEW B, MD. Schedule an appointment as soon as possible for a visit in 2 week(s).   Specialty:  General Surgery Contact information: [address]  [place] [address] [phone]        Anthoney Harada, MD Follow up in 1 week(s).   Specialty:  Family Medicine Why:  Call to schedule appointment Contact information: [place]  [address] [phone]        [name], MD Follow up in 1 week(s).   Specialty:  Oncology Why:  Follow Up in 1 week and Call to schedule appointment Contact information: [place], [address] [place] [address] [phone]          Allergies  Allergen Reactions  . Niaspan [Niacin]     Severe flushing  . Percocet [Oxycodone-Acetaminophen] Nausea Only    hallucinations    Consultations:  Gastroenterology Dr. Paulita Fujita  General Surgery Dr. Hassell Done  Oncology Dr. Marin Olp  Procedures/Studies: Ct Chest W Contrast  Result Date: 12/31/2015 CLINICAL DATA:  Colon cancer.  Staging. EXAM: CT CHEST, ABDOMEN, AND PELVIS WITH CONTRAST TECHNIQUE: Multidetector CT imaging of the chest, abdomen and pelvis was performed following the standard protocol during bolus administration of intravenous contrast. CONTRAST:  122mL ISOVUE-300 IOPAMIDOL (ISOVUE-300) INJECTION 61% COMPARISON:  None. FINDINGS: CT CHEST FINDINGS Cardiovascular: Normal heart size.  No pericardial effusion. Mediastinum/Nodes: The trachea appears patent and is midline. No mediastinal or hilar adenopathy. No axillary or supraclavicular adenopathy. Normal appearance of the esophagus. Lungs/Pleura: No pleural fluid. No airspace consolidation or atelectasis. 3 mm right middle lobe  lung nodule is identified, image number 92 of series 7. Also in the right middle lobe is a 4 mm lung nodule, image 103 of series 7. 4 mm right lower lobe lung nodule is identified, image 106 of series 7. Also in the right middle lobe is a 3 mm right lower lobe lung nodule, image 109 of series 7. No lung nodules identified within the left lung. Musculoskeletal: No chest wall mass or suspicious bone lesions identified. CT ABDOMEN PELVIS FINDINGS Hepatobiliary: There is diffuse hepatic steatosis. Gallbladder appears normal. Tiny low density structure within the posterior aspect of the lateral segment of left lobe measures 8 mm, image 52 of series 3.  Pancreas: Unremarkable. No pancreatic ductal dilatation or surrounding inflammatory changes. Spleen: Normal in size without focal abnormality. Adrenals/Urinary Tract: The adrenal glands are normal. Normal appearance of the right kidney. Nonobstructing calculus identified within the inferior pole the left kidney. Cyst within the lower pole of the left kidney measures 2.4 cm, image 80 of series 3. Several smaller cysts are also identified within the upper and mid left kidney there is no mass or hydronephrosis identified. The urinary bladder appears normal. Stomach/Bowel: Stomach is normal. The small bowel loops have a normal course and caliber. No pathologic dilatation of the large or small bowel loops. Circumferential wall thickening involving the 5 cm segment of sigmoid colon is identified, image number 109 of series 3. There are several lymph nodes within the adjacent fat which measure up to 9 mm, image 106 of series 3, image 105 of series 3. Vascular/Lymphatic: Normal appearance of the abdominal aorta. No enlarged upper abdominal lymph nodes. No pathologically enlarged iliac or inguinal lymph nodes. Reproductive: Prostate is unremarkable. Other: There is no ascites or focal fluid collections within the abdomen or pelvis. Musculoskeletal: No aggressive lytic or sclerotic bone lesions identified. IMPRESSION: 1. Segment of sigmoid colon with circumferential wall thickening corresponds to abnormal colonoscopy findings from today demonstrating a malignant, partially obstructed tumor in the sigmoid colon. 2. Small lymph nodes within the surrounding fat are noted and may reflect local lymph node metastasis. 3. No specific findings identified to suggest distant metastatic disease. 4. There are a total of 4 small (less than 5 mm) nodules identified within the right middle lobe and right base. Their appearance is nonspecific and unfortunately there are no prior chest CTs for comparison. Close interval followup of these  lymph nodes is advise. 5. 8 mm low-attenuation structure within the lateral segment of left lobe of liver is too small to characterize 6. Hepatic steatosis Electronically Signed   By: Kerby Moors M.D.   On: 12/31/2015 17:27   Ct Abdomen Pelvis W Contrast  Result Date: 12/31/2015 CLINICAL DATA:  Colon cancer.  Staging. EXAM: CT CHEST, ABDOMEN, AND PELVIS WITH CONTRAST TECHNIQUE: Multidetector CT imaging of the chest, abdomen and pelvis was performed following the standard protocol during bolus administration of intravenous contrast. CONTRAST:  161mL ISOVUE-300 IOPAMIDOL (ISOVUE-300) INJECTION 61% COMPARISON:  None. FINDINGS: CT CHEST FINDINGS Cardiovascular: Normal heart size.  No pericardial effusion. Mediastinum/Nodes: The trachea appears patent and is midline. No mediastinal or hilar adenopathy. No axillary or supraclavicular adenopathy. Normal appearance of the esophagus. Lungs/Pleura: No pleural fluid. No airspace consolidation or atelectasis. 3 mm right middle lobe lung nodule is identified, image number 92 of series 7. Also in the right middle lobe is a 4 mm lung nodule, image 103 of series 7. 4 mm right lower lobe lung nodule is identified, image 106 of series 7. Also  in the right middle lobe is a 3 mm right lower lobe lung nodule, image 109 of series 7. No lung nodules identified within the left lung. Musculoskeletal: No chest wall mass or suspicious bone lesions identified. CT ABDOMEN PELVIS FINDINGS Hepatobiliary: There is diffuse hepatic steatosis. Gallbladder appears normal. Tiny low density structure within the posterior aspect of the lateral segment of left lobe measures 8 mm, image 52 of series 3. Pancreas: Unremarkable. No pancreatic ductal dilatation or surrounding inflammatory changes. Spleen: Normal in size without focal abnormality. Adrenals/Urinary Tract: The adrenal glands are normal. Normal appearance of the right kidney. Nonobstructing calculus identified within the inferior pole the  left kidney. Cyst within the lower pole of the left kidney measures 2.4 cm, image 80 of series 3. Several smaller cysts are also identified within the upper and mid left kidney there is no mass or hydronephrosis identified. The urinary bladder appears normal. Stomach/Bowel: Stomach is normal. The small bowel loops have a normal course and caliber. No pathologic dilatation of the large or small bowel loops. Circumferential wall thickening involving the 5 cm segment of sigmoid colon is identified, image number 109 of series 3. There are several lymph nodes within the adjacent fat which measure up to 9 mm, image 106 of series 3, image 105 of series 3. Vascular/Lymphatic: Normal appearance of the abdominal aorta. No enlarged upper abdominal lymph nodes. No pathologically enlarged iliac or inguinal lymph nodes. Reproductive: Prostate is unremarkable. Other: There is no ascites or focal fluid collections within the abdomen or pelvis. Musculoskeletal: No aggressive lytic or sclerotic bone lesions identified. IMPRESSION: 1. Segment of sigmoid colon with circumferential wall thickening corresponds to abnormal colonoscopy findings from today demonstrating a malignant, partially obstructed tumor in the sigmoid colon. 2. Small lymph nodes within the surrounding fat are noted and may reflect local lymph node metastasis. 3. No specific findings identified to suggest distant metastatic disease. 4. There are a total of 4 small (less than 5 mm) nodules identified within the right middle lobe and right base. Their appearance is nonspecific and unfortunately there are no prior chest CTs for comparison. Close interval followup of these lymph nodes is advise. 5. 8 mm low-attenuation structure within the lateral segment of left lobe of liver is too small to characterize 6. Hepatic steatosis Electronically Signed   By: Kerby Moors M.D.   On: 12/31/2015 17:27   COLONOSCOPY     - Non-thrombosed external hemorrhoids found on    perianal exam. Small. Not source of bleeding. No   internal hemorrhoids. - One 8 mm polyp in the sigmoid colon, removed with  a hot snare. Resected and retrieved. - Likely malignant partially obstructing tumor in    the sigmoid colon. Biopsied. Tattooed.  Subjective: Seen and examined and doing well. Had mild Nausea. No Abdominal Complaints. Tolerating diet well and no reoccurence of blood in stools. Ready to go home.  Discharge Exam: Vitals:   01/04/16 2115 01/05/16 0530  BP: 130/80 (!) 149/93  Pulse: 70 66  Resp: 18 18  Temp: 98.6 F (37 C) 98.5 F (36.9 C)   Vitals:   01/04/16 0558 01/04/16 1429 01/04/16 2115 01/05/16 0530  BP: 140/87 128/80 130/80 (!) 149/93  Pulse: 72 77 70 66  Resp: 16 18 18 18   Temp: 98.7 F (37.1 C) 97.8 F (36.6 C) 98.6 F (37 C) 98.5 F (36.9 C)  TempSrc: Oral Oral Oral Oral  SpO2: 100% 100% 99% 100%  Weight:      Height:  General: Pt is alert, awake, not in acute distress Cardiovascular: RRR, S1/S2 +, no rubs, no gallops Respiratory: CTA bilaterally, no wheezing, no rhonchi Abdominal: Soft, NT, ND, bowel sounds + Extremities: no edema, no cyanosis  The results of significant diagnostics from this hospitalization (including imaging, microbiology, ancillary and laboratory) are listed below for reference.     Microbiology: Recent Results (from the past 240 hour(s))  Surgical pcr screen     Status: Abnormal   Collection Time: 12/31/15 10:42 PM  Result Value Ref Range Status   MRSA, PCR NEGATIVE NEGATIVE Final   Staphylococcus aureus POSITIVE (A) NEGATIVE Final    Comment:        The Xpert SA Assay (FDA approved for NASAL specimens in patients over 73 years of age), is one component of a comprehensive surveillance program.  Test performance has been validated by [facility] for patients greater than or equal to 72 year old. It is not intended to diagnose infection nor to guide or  monitor treatment.      Labs: BNP (last 3 results) No results for input(s): BNP in the last 8760 hours. Basic Metabolic Panel:  Recent Labs Lab 01/01/16 0138 01/02/16 0431 01/03/16 0505 01/04/16 0500 01/05/16 0509  NA 140 140 145 142 141  K 2.8* 3.0* 3.6 3.2* 3.1*  CL 106 106 112* 111 107  CO2 27 28 29 25 28   GLUCOSE 104* 130* 111* 106* 99  BUN 13 13 12 9 12   CREATININE 0.87 0.92 0.88 0.80 0.82  CALCIUM 8.5* 8.2* 8.0* 8.3* 8.5*  MG 2.1 2.2 2.3 1.9 2.2  PHOS 3.6 3.6 1.7* 2.0* 3.4   Liver Function Tests:  Recent Labs Lab 01/01/16 0138 01/02/16 0431 01/03/16 0505 01/04/16 0500 01/05/16 0509  AST 26 21 24 20 20   ALT 42 34 27 25 30   ALKPHOS 60 49 42 46 49  BILITOT 1.2 0.8 0.8 0.7 1.0  PROT 6.5 6.1* 6.0* 6.1* 6.4*  ALBUMIN 4.0 3.7 3.4* 3.5 3.6   No results for input(s): LIPASE, AMYLASE in the last 168 hours. No results for input(s): AMMONIA in the last 168 hours. CBC:  Recent Labs Lab 01/01/16 0138 01/02/16 0431 01/03/16 0505 01/04/16 0500 01/05/16 0509  WBC 7.9 15.3* 10.3 10.1 8.6  NEUTROABS 4.4 13.3* 7.1 7.3 5.8  HGB 12.9* 11.9* 11.0* 10.4* 10.7*  HCT 36.3* 32.9* 31.3* 29.7* 29.1*  MCV 86.0 85.9 88.9 89.7 84.8  PLT 261 286 246 248 253   Cardiac Enzymes: No results for input(s): CKTOTAL, CKMB, CKMBINDEX, TROPONINI in the last 168 hours. BNP: Invalid input(s): POCBNP CBG: No results for input(s): GLUCAP in the last 168 hours. D-Dimer No results for input(s): DDIMER in the last 72 hours. Hgb A1c No results for input(s): HGBA1C in the last 72 hours. Lipid Profile No results for input(s): CHOL, HDL, LDLCALC, TRIG, CHOLHDL, LDLDIRECT in the last 72 hours. Thyroid function studies No results for input(s): TSH, T4TOTAL, T3FREE, THYROIDAB in the last 72 hours.  Invalid input(s): FREET3 Anemia work up No results for input(s): VITAMINB12, FOLATE, FERRITIN, TIBC, IRON, RETICCTPCT in the last 72 hours. Urinalysis No results found for: COLORURINE,  APPEARANCEUR, [place], [place], [place], [place], [place], [place], PROTEINUR, UROBILINOGEN, NITRITE, LEUKOCYTESUR Sepsis Labs Invalid input(s): PROCALCITONIN,  WBC,  LACTICIDVEN Microbiology Recent Results (from the past 240 hour(s))  Surgical pcr screen     Status: Abnormal   Collection Time: 12/31/15 10:42 PM  Result Value Ref Range Status   MRSA, PCR NEGATIVE NEGATIVE Final   Staphylococcus  aureus POSITIVE (A) NEGATIVE Final    Comment:        The Xpert SA Assay (FDA approved for NASAL specimens in patients over 34 years of age), is one component of a comprehensive surveillance program.  Test performance has been validated by [facility] for patients greater than or equal to 61 year old. It is not intended to diagnose infection nor to guide or monitor treatment.    Time coordinating discharge: Over 30 minutes  SIGNED:  Kerney Elbe, DO [facility] 01/05/2016, 10:51 AM Pager [phone]  If 7PM-7AM, please contact night-coverage [email or web address] Password [email or web address]

## 2016-01-05 NOTE — Progress Notes (Signed)
4 Days Post-Op  Subjective: Doing well.  Tolerating a solid diet.  Having BM's  Objective: Vital signs in last 24 hours: Temp:  [97.8 F (36.6 C)-98.6 F (37 C)] 98.5 F (36.9 C) (12/11 0530) Pulse Rate:  [66-77] 66 (12/11 0530) Resp:  [18] 18 (12/11 0530) BP: (128-149)/(80-93) 149/93 (12/11 0530) SpO2:  [99 %-100 %] 100 % (12/11 0530) Last BM Date: 01/04/16    Intake/Output from previous day: 12/10 0701 - 12/11 0700 In: 606 [P.O.:600; I.V.:6] Out: 2200 [Urine:2200] Intake/Output this shift: No intake/output data recorded.  General appearance: alert, cooperative   Resp: clear to auscultation bilaterally GI: abd soft, inc clean  Lab Results:   Recent Labs  01/04/16 0500 01/05/16 0509  WBC 10.1 8.6  HGB 10.4* 10.7*  HCT 29.7* 29.1*  PLT 248 253    BMET  Recent Labs  01/04/16 0500 01/05/16 0509  NA 142 141  K 3.2* 3.1*  CL 111 107  CO2 25 28  GLUCOSE 106* 99  BUN 9 12  CREATININE 0.80 0.82  CALCIUM 8.3* 8.5*   PT/INR No results for input(s): LABPROT, INR in the last 72 hours.   Recent Labs Lab 01/01/16 0138 01/02/16 0431 01/03/16 0505 01/04/16 0500 01/05/16 0509  AST 26 21 24 20 20   ALT 42 34 27 25 30   ALKPHOS 60 49 42 46 49  BILITOT 1.2 0.8 0.8 0.7 1.0  PROT 6.5 6.1* 6.0* 6.1* 6.4*  ALBUMIN 4.0 3.7 3.4* 3.5 3.6     Lipase  No results found for: LIPASE   Studies/Results: No results found. Prior to Admission medications   Medication Sig Start Date End Date Taking? Authorizing Provider  losartan-hydrochlorothiazide (HYZAAR) 100-25 MG tablet Take 1 tablet by mouth daily. 08/19/15  Yes Susy Frizzle, MD  potassium chloride SA (K-DUR,KLOR-CON) 20 MEQ tablet Take 1 tablet (20 mEq total) by mouth daily. 06/20/15  Yes Susy Frizzle, MD  potassium chloride (K-DUR) 10 MEQ tablet Take 1 tablet (10 mEq total) by mouth daily x 5days Patient not taking: Reported on 12/30/2015 05/08/15   Susy Frizzle, MD  pravastatin (PRAVACHOL) 20 MG tablet  TAKE 1 TABLET (20 MG TOTAL) BY MOUTH DAILY. Patient not taking: Reported on 12/30/2015 11/05/15   Susy Frizzle, MD     Medications: . heparin subcutaneous  5,000 Units Subcutaneous Q8H  . losartan  100 mg Oral Daily   And  . hydrochlorothiazide  25 mg Oral Daily  . potassium chloride  40 mEq Oral BID  . saccharomyces boulardii  250 mg Oral BID  . sodium chloride flush  3 mL Intravenous Q12H     Hx of hypertension Hx of nephrolithiasis  Assessment/Plan  Rectosigmoid cancer Laparoscopically assisted resection of the rectosigmoid with #33 EEA stapled anastomosis; mobilization of the splenic flexure;  rigid endoscopy, 01/01/16, Johnathan Hausen   4 small lymph nodes, RML and RLL noted on CT Hypokalemia FEN:  Soft diet ID: Pre op abx only DVT: Heparin/SCD  Plan: Cont soft diet Ok for d/c F/u with Dr Hassell Done.  Office will contact pt by phone with path results      LOS: 5 days    Rechy Bost C. [id number]

## 2016-01-05 NOTE — Discharge Instructions (Signed)

## 2016-01-07 ENCOUNTER — Other Ambulatory Visit: Payer: Self-pay | Admitting: Hematology & Oncology

## 2016-01-07 ENCOUNTER — Encounter: Payer: Self-pay | Admitting: Hematology & Oncology

## 2016-01-07 DIAGNOSIS — Z85038 Personal history of other malignant neoplasm of large intestine: Secondary | ICD-10-CM

## 2016-01-07 DIAGNOSIS — C187 Malignant neoplasm of sigmoid colon: Secondary | ICD-10-CM

## 2016-01-07 DIAGNOSIS — K921 Melena: Secondary | ICD-10-CM

## 2016-01-07 DIAGNOSIS — C772 Secondary and unspecified malignant neoplasm of intra-abdominal lymph nodes: Secondary | ICD-10-CM

## 2016-01-07 HISTORY — DX: Personal history of other malignant neoplasm of large intestine: Z85.038

## 2016-01-07 HISTORY — DX: Secondary and unspecified malignant neoplasm of intra-abdominal lymph nodes: C18.7

## 2016-01-07 HISTORY — DX: Secondary and unspecified malignant neoplasm of intra-abdominal lymph nodes: C77.2

## 2016-01-13 ENCOUNTER — Ambulatory Visit: Payer: BLUE CROSS/BLUE SHIELD

## 2016-01-13 ENCOUNTER — Ambulatory Visit (HOSPITAL_BASED_OUTPATIENT_CLINIC_OR_DEPARTMENT_OTHER): Payer: BLUE CROSS/BLUE SHIELD | Admitting: Hematology & Oncology

## 2016-01-13 ENCOUNTER — Telehealth: Payer: Self-pay | Admitting: *Deleted

## 2016-01-13 ENCOUNTER — Other Ambulatory Visit (HOSPITAL_BASED_OUTPATIENT_CLINIC_OR_DEPARTMENT_OTHER): Payer: BLUE CROSS/BLUE SHIELD

## 2016-01-13 VITALS — BP 121/82 | HR 86 | Temp 98.7°F | Resp 20 | Ht 73.0 in | Wt 234.0 lb

## 2016-01-13 DIAGNOSIS — Z85038 Personal history of other malignant neoplasm of large intestine: Secondary | ICD-10-CM

## 2016-01-13 DIAGNOSIS — K921 Melena: Secondary | ICD-10-CM

## 2016-01-13 DIAGNOSIS — C187 Malignant neoplasm of sigmoid colon: Secondary | ICD-10-CM

## 2016-01-13 DIAGNOSIS — C772 Secondary and unspecified malignant neoplasm of intra-abdominal lymph nodes: Secondary | ICD-10-CM

## 2016-01-13 LAB — CMP (CANCER CENTER ONLY)
ALK PHOS: 75 U/L (ref 26–84)
ALT(SGPT): 74 U/L — ABNORMAL HIGH (ref 10–47)
AST: 38 U/L (ref 11–38)
Albumin: 3.8 g/dL (ref 3.3–5.5)
BUN: 17 mg/dL (ref 7–22)
CO2: 31 mEq/L (ref 18–33)
CREATININE: 0.9 mg/dL (ref 0.6–1.2)
Calcium: 9.8 mg/dL (ref 8.0–10.3)
Chloride: 105 mEq/L (ref 98–108)
Glucose, Bld: 103 mg/dL (ref 73–118)
Potassium: 2.9 mEq/L — CL (ref 3.3–4.7)
SODIUM: 145 meq/L (ref 128–145)
TOTAL PROTEIN: 7.6 g/dL (ref 6.4–8.1)
Total Bilirubin: 0.6 mg/dl (ref 0.20–1.60)

## 2016-01-13 LAB — CBC WITH DIFFERENTIAL (CANCER CENTER ONLY)
BASO#: 0.1 10*3/uL (ref 0.0–0.2)
BASO%: 0.6 % (ref 0.0–2.0)
EOS%: 0.9 % (ref 0.0–7.0)
Eosinophils Absolute: 0.1 10*3/uL (ref 0.0–0.5)
HCT: 36.8 % — ABNORMAL LOW (ref 38.7–49.9)
HGB: 13 g/dL (ref 13.0–17.1)
LYMPH#: 2 10*3/uL (ref 0.9–3.3)
LYMPH%: 24 % (ref 14.0–48.0)
MCH: 30.7 pg (ref 28.0–33.4)
MCHC: 35.3 g/dL (ref 32.0–35.9)
MCV: 87 fL (ref 82–98)
MONO#: 0.6 10*3/uL (ref 0.1–0.9)
MONO%: 7 % (ref 0.0–13.0)
NEUT#: 5.5 10*3/uL (ref 1.5–6.5)
NEUT%: 67.5 % (ref 40.0–80.0)
PLATELETS: 436 10*3/uL — AB (ref 145–400)
RBC: 4.23 10*6/uL (ref 4.20–5.70)
RDW: 13.2 % (ref 11.1–15.7)
WBC: 8.2 10*3/uL (ref 4.0–10.0)

## 2016-01-13 LAB — LACTATE DEHYDROGENASE: LDH: 200 U/L (ref 125–245)

## 2016-01-13 LAB — IRON AND TIBC
%SAT: 11 % — ABNORMAL LOW (ref 20–55)
Iron: 41 ug/dL — ABNORMAL LOW (ref 42–163)
TIBC: 369 ug/dL (ref 202–409)
UIBC: 328 ug/dL (ref 117–376)

## 2016-01-13 LAB — CEA (IN HOUSE-CHCC): CEA (CHCC-In House): 2.25 ng/mL (ref 0.00–5.00)

## 2016-01-13 LAB — FERRITIN: FERRITIN: 34 ng/mL (ref 22–316)

## 2016-01-13 NOTE — Progress Notes (Signed)
START ON PATHWAY REGIMEN - Colorectal  [id number]: Postoperative [id number] To Complete a Total of 6 Months   A cycle is every 14 days:     Oxaliplatin (Eloxatin(R)) 85 mg/m2 in 250 mL D5W IV over 2 hours day 1 Dose Mod: None     Leucovorin 400 mg/m2 in 250 mL D5W IV over 2 hours day 1 followed immediately by Dose Mod: None     5-Fluorouracil 400 mg/m2 IV bolus over 2-4 minutes day 1 Dose Mod: None     5-Fluorouracil 2,400 mg/m2 in _____mL NS IV as a 46 hour infusion day 1 Dose Mod: None Additional Orders: Schedule next Chemotherapy  **Always confirm dose/schedule in your pharmacy ordering system**    Patient Characteristics: Metastatic Colorectal, First Line, Postoperative Treatment for R0 Resection, KRAS Mutation Positive/Unknown, BRAF Wild-Type/Unknown AJCC T Stage: 4a AJCC N Stage: 2b AJCC Stage Grouping: IIIC AJCC M Stage: 0 Current evidence of distant metastases? Yes BRAF Mutation Status: Awaiting Test Results KRAS/NRAS Mutation Status: Awaiting Test Results Line of therapy: First Line Would you be surprised if this patient died  in the next year? I would be surprised if this patient died in the next year  Intent of Therapy: Curative Intent, Discussed with Patient

## 2016-01-13 NOTE — Telephone Encounter (Signed)
Critical Value Potassium 2.9 Dr Ennever notified. No orders at this time 

## 2016-01-14 NOTE — Progress Notes (Signed)
Hematology and Oncology Follow Up Visit  Vincent Strickland [id number] [birth year] 50 y.o. 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 I saw him in ossification at [facility] on December 8. He come in with some rectal bleeding. He had a colonoscopy. This showed a moderately differentiated adenocarcinoma ([id number]). He then underwent an APR. This was Strickland on December 7. The pathology report ([id number]) showed a invasive well-differentiated adenocarcinoma. All margins were negative. The tumor measured 6.6 cm. This appear to be in the proximal third of the rectum. 5/19 lymph nodes positive. There was no lymphovascular invasion. There is no perineural invasion. The tumor did invade into the mesenteric adipose tissue.  The tumor was normal for MMR..  The preoperative CEA was 12.9.  The staging was stage IIIc (T4N2M0).  He really looks great. He was discharged about 3 or 4 days after his surgery. He is eating. He is having a better appetite. He is going to the bathroom.  He has had one episode of bleeding postop.  He has had no cough or shortness of breath. He has had no rashes. He has had no leg swelling.  He's not noted any fatigue. He's had some fatigue but this is improving.  He's had no fever.  Overall, his performance status is ECOG 1.  Medications:  Current Outpatient Prescriptions:  .  acetaminophen-codeine (TYLENOL #3) 300-30 MG tablet, Take 1-2 tablets by mouth every 4 (four) hours as needed for moderate pain., Disp: 15 tablet, Rfl: 0 .  losartan-hydrochlorothiazide (HYZAAR) 100-25 MG tablet, Take 1 tablet by mouth daily., Disp: 90 tablet, Rfl: 3 .  potassium chloride SA (K-DUR,KLOR-CON) 20 MEQ tablet, Take 1 tablet (20 mEq total) by mouth daily., Disp: 90 tablet, Rfl: 3 .  saccharomyces boulardii (FLORASTOR) 250  MG capsule, Take 1 capsule (250 mg total) by mouth 2 (two) times daily., Disp: 20 capsule, Rfl: 0  Allergies:  Allergies  Allergen Reactions  . Niaspan [Niacin]     Severe flushing  . Percocet [Oxycodone-Acetaminophen] Nausea Only    hallucinations    Past Medical History, Surgical history, Social history, and Family History were reviewed and updated.  Review of Systems: As above  Physical Exam:  height is '6\' 1"'$  (1.854 m) and weight is 234 lb (106.1 kg). His oral temperature is 98.7 F (37.1 C). His blood pressure is 121/82 and his pulse is 86. His respiration is 20.   Wt Readings from Last 3 Encounters:  01/13/16 234 lb (106.1 kg)  12/31/15 247 lb (112 kg)  06/06/15 243 lb (110.2 kg)     Well-developed well-nourished white male in no obvious distress. Head and neck exam shows no ocular or oral lesions. There are no palpable cervical or supraclavicular lymph nodes. Lungs are clear. Cardiac exam regular rate and rhythm with no murmurs, rubs or bruits. Abdomen is soft. Bowel sounds are present. He has the laparoscopy scars. They are healed. There is no obvious abdominal mass. There is no obvious hepatosplenomegaly. Back exam shows no tenderness over the spine, ribs or hips. Extremities shows no clubbing, cyanosis or edema. Neurological exam shows no focal neurological deficits. Skin exam shows no rashes, ecchymoses or petechia.  Lab Results  Component Value Date   WBC 8.2 01/13/2016   HGB 13.0 01/13/2016   HCT 36.8 (L) 01/13/2016  MCV 87 01/13/2016   PLT 436 (H) 01/13/2016     Chemistry      Component Value Date/Time   NA 145 01/13/2016 0819   K 2.9 (LL) 01/13/2016 0819   CL 105 01/13/2016 0819   CO2 31 01/13/2016 0819   BUN 17 01/13/2016 0819   CREATININE 0.9 01/13/2016 0819      Component Value Date/Time   CALCIUM 9.8 01/13/2016 0819   ALKPHOS 75 01/13/2016 0819   AST 38 01/13/2016 0819   ALT 74 (H) 01/13/2016 0819   BILITOT 0.60 01/13/2016 0819          Impression and Plan: Vincent Strickland is A 50 year old white male. There is no family history of colon cancer.  It looks like this actually might be rectal cancer. By the path report, it states that the tumor site is in the rectum.  Because of this, we may have to have both radiation and chemotherapy. I think with the 5 positive lymph nodes, he definitely is at risk for local recurrence.  He will need to have a Port-A-Cath placed. I'll have to talk with Vincent Strickland to see about getting this set up.  I think that we will start off with aggressive chemotherapy. I think FOLFOX would be standard.  I gave him information she's about chemotherapy. I explained how chemotherapy works. I went over the side effects. I explained the neurological issues with oxaliplatin. I explained that the 5-FU will be infusional that this will take about 2 days. I would not expect that he would need to be transfused. I would expect that he should have a lot of toxicity with the 5-FU. I explained him the genetic abnormality that I can sometimes cause severe 5-FU toxicity.  I will Vincent Strickland started the first week in January. That would be almost a month since he had his surgery. He really should be ready to go.  I think that without any treatment, that her risk of recurrence probably would be at least 70-80%. With treatment, I will like to think that we could decrease his risk of recurrence down to less than 25%.  I spent about an hour with he and his family. They're all very nice. We will try to get everything set up so we can get started the first week in January.  If we go with combined modality therapy, I would give him 6 cycles of upfront chemotherapy, then do oral Xeloda with radiation, and then follow-up with 4 cycles of FOLFOX.   Vincent Napoleon, MD 12/20/20176:59 AM

## 2016-01-20 ENCOUNTER — Encounter (HOSPITAL_COMMUNITY): Payer: Self-pay

## 2016-01-20 ENCOUNTER — Encounter (HOSPITAL_COMMUNITY)
Admission: RE | Admit: 2016-01-20 | Discharge: 2016-01-20 | Disposition: A | Discharge status: Home / Self Care | Payer: BLUE CROSS/BLUE SHIELD | Source: Ambulatory Visit | Attending: Surgery | Admitting: Surgery

## 2016-01-20 DIAGNOSIS — Z01812 Encounter for preprocedural laboratory examination: Secondary | ICD-10-CM | POA: Diagnosis not present

## 2016-01-20 DIAGNOSIS — I10 Essential (primary) hypertension: Secondary | ICD-10-CM | POA: Insufficient documentation

## 2016-01-20 HISTORY — DX: Headache, unspecified: R51.9

## 2016-01-20 HISTORY — DX: Personal history of urinary calculi: Z87.442

## 2016-01-20 HISTORY — DX: Benign paroxysmal vertigo, unspecified ear: H81.10

## 2016-01-20 HISTORY — DX: Headache: R51

## 2016-01-20 HISTORY — DX: Personal history of urinary (tract) infections: Z87.440

## 2016-01-20 LAB — CBC
HEMATOCRIT: 37.3 % — AB (ref 39.0–52.0)
HEMOGLOBIN: 13 g/dL (ref 13.0–17.0)
MCH: 29.9 pg (ref 26.0–34.0)
MCHC: 34.9 g/dL (ref 30.0–36.0)
MCV: 85.7 fL (ref 78.0–100.0)
Platelets: 430 10*3/uL — ABNORMAL HIGH (ref 150–400)
RBC: 4.35 MIL/uL (ref 4.22–5.81)
RDW: 13 % (ref 11.5–15.5)
WBC: 6.9 10*3/uL (ref 4.0–10.5)

## 2016-01-20 LAB — SURGICAL PCR SCREEN
MRSA, PCR: NEGATIVE
Staphylococcus aureus: NEGATIVE

## 2016-01-20 LAB — BASIC METABOLIC PANEL
ANION GAP: 8 (ref 5–15)
BUN: 15 mg/dL (ref 6–20)
CALCIUM: 9.7 mg/dL (ref 8.9–10.3)
CO2: 27 mmol/L (ref 22–32)
Chloride: 106 mmol/L (ref 101–111)
Creatinine, Ser: 0.89 mg/dL (ref 0.61–1.24)
GFR calc non Af Amer: >60 mL/min (ref >60)
Glucose, Bld: 106 mg/dL — ABNORMAL HIGH (ref 65–99)
POTASSIUM: 3.3 mmol/L — AB (ref 3.5–5.1)
Sodium: 141 mmol/L (ref 135–145)

## 2016-01-20 NOTE — Progress Notes (Signed)
Pt in for preop appt this am 01/20/2016; no surgical orders in epic; please place surgical orders in epic. Thanks.

## 2016-01-20 NOTE — Patient Instructions (Signed)
Vincent Strickland  01/20/2016   Your procedure is scheduled on: Tuesday January 27, 2016  Report to [facility] Main  Entrance take [place]  elevators to 3rd floor to  [place] at 1:30 PM.  Call this number if you have problems the morning of surgery [phone]   Remember: ONLY 1 PERSON MAY GO WITH YOU TO SHORT STAY TO GET  READY MORNING OF Vincent Strickland.  Do not eat food After Midnight but may take clear liquids till 9:30 am day of surgery then nothing by mouth.      Take these medicines the morning of surgery: NONE                                You may not have any metal on your body including hair pins and              piercings  Do not wear jewelry, lotions, powders or colognes, deodorant                      Men may shave face and neck.   Do not bring valuables to the hospital. [place].  Contacts, dentures or bridgework may not be worn into surgery.     Patients discharged the day of surgery will not be allowed to drive home.  Name and phone number of your driver:[name] Gutridge (wife)  _____________________________________________________________________             [facility] - Preparing for Surgery Before surgery, you can play an important role.  Because skin is not sterile, your skin needs to be as free of germs as possible.  You can reduce the number of germs on your skin by washing with CHG (chlorahexidine gluconate) soap before surgery.  CHG is an antiseptic cleaner which kills germs and bonds with the skin to continue killing germs even after washing. Please DO NOT use if you have an allergy to CHG or antibacterial soaps.  If your skin becomes reddened/irritated stop using the CHG and inform your nurse when you arrive at Short Stay. Do not shave (including legs and underarms) for at least 48 hours prior to the first CHG shower.  You may shave your face/neck. Please follow these instructions  carefully:  1.  Shower with CHG Soap the night before surgery and the  morning of Surgery.  2.  If you choose to wash your hair, wash your hair first as usual with your  normal  shampoo.  3.  After you shampoo, rinse your hair and body thoroughly to remove the  shampoo.                           4.  Use CHG as you would any other liquid soap.  You can apply chg directly  to the skin and wash                       Gently with a scrungie or clean washcloth.  5.  Apply the CHG Soap to your body ONLY FROM THE NECK DOWN.   Do not use on face/ open  Wound or open sores. Avoid contact with eyes, ears mouth and genitals (private parts).                       Wash face,  Genitals (private parts) with your normal soap.             6.  Wash thoroughly, paying special attention to the area where your surgery  will be performed.  7.  Thoroughly rinse your body with warm water from the neck down.  8.  DO NOT shower/wash with your normal soap after using and rinsing off  the CHG Soap.                9.  Pat yourself dry with a clean towel.            10.  Wear clean pajamas.            11.  Place clean sheets on your bed the night of your first shower and do not  sleep with pets. Day of Surgery : Do not apply any lotions/deodorants the morning of surgery.  Please wear clean clothes to the hospital/surgery center.  FAILURE TO FOLLOW THESE INSTRUCTIONS MAY RESULT IN THE CANCELLATION OF YOUR SURGERY PATIENT SIGNATURE_________________________________  NURSE SIGNATURE__________________________________  ________________________________________________________________________    CLEAR LIQUID DIET   Foods Allowed                                                                     Foods Excluded  Coffee and tea, regular and decaf                             liquids that you cannot  Plain Jell-O in any flavor                                             see through such as: Fruit ices  (not with fruit pulp)                                     milk, soups, orange juice  Iced Popsicles                                    All solid food Carbonated beverages, regular and diet                                    Cranberry, grape and apple juices Sports drinks like Gatorade Lightly seasoned clear broth or consume(fat free) Sugar, honey syrup  Sample Menu Breakfast                                Lunch  Supper Cranberry juice                    Beef broth                            Chicken broth Jell-O                                     Grape juice                           Apple juice Coffee or tea                        Jell-O                                      Popsicle                                                Coffee or tea                        Coffee or tea  _____________________________________________________________________

## 2016-01-22 ENCOUNTER — Other Ambulatory Visit: Payer: BLUE CROSS/BLUE SHIELD

## 2016-01-22 ENCOUNTER — Ambulatory Visit: Payer: Self-pay | Admitting: Surgery

## 2016-01-27 ENCOUNTER — Encounter (HOSPITAL_COMMUNITY): Payer: Self-pay | Admitting: *Deleted

## 2016-01-27 ENCOUNTER — Encounter (HOSPITAL_COMMUNITY)
Admission: RE | Disposition: A | Discharge status: Home / Self Care | Payer: Self-pay | Source: Ambulatory Visit | Attending: Surgery

## 2016-01-27 ENCOUNTER — Ambulatory Visit (HOSPITAL_COMMUNITY): Payer: BLUE CROSS/BLUE SHIELD | Admitting: Anesthesiology

## 2016-01-27 ENCOUNTER — Ambulatory Visit (HOSPITAL_COMMUNITY): Payer: BLUE CROSS/BLUE SHIELD

## 2016-01-27 ENCOUNTER — Other Ambulatory Visit: Payer: Self-pay | Admitting: Oncology

## 2016-01-27 ENCOUNTER — Ambulatory Visit (HOSPITAL_COMMUNITY)
Admission: RE | Admit: 2016-01-27 | Discharge: 2016-01-27 | Disposition: A | Discharge status: Home / Self Care | Payer: BLUE CROSS/BLUE SHIELD | Source: Ambulatory Visit | Attending: Surgery | Admitting: Surgery

## 2016-01-27 DIAGNOSIS — C785 Secondary malignant neoplasm of large intestine and rectum: Secondary | ICD-10-CM | POA: Insufficient documentation

## 2016-01-27 DIAGNOSIS — C187 Malignant neoplasm of sigmoid colon: Secondary | ICD-10-CM | POA: Diagnosis not present

## 2016-01-27 DIAGNOSIS — Z95828 Presence of other vascular implants and grafts: Secondary | ICD-10-CM

## 2016-01-27 DIAGNOSIS — Z79899 Other long term (current) drug therapy: Secondary | ICD-10-CM | POA: Diagnosis not present

## 2016-01-27 DIAGNOSIS — I10 Essential (primary) hypertension: Secondary | ICD-10-CM | POA: Diagnosis not present

## 2016-01-27 DIAGNOSIS — Z09 Encounter for follow-up examination after completed treatment for conditions other than malignant neoplasm: Secondary | ICD-10-CM

## 2016-01-27 DIAGNOSIS — Z85038 Personal history of other malignant neoplasm of large intestine: Secondary | ICD-10-CM

## 2016-01-27 HISTORY — PX: PORTACATH PLACEMENT: SHX2246

## 2016-01-27 SURGERY — INSERTION, TUNNELED CENTRAL VENOUS DEVICE, WITH PORT
Anesthesia: General

## 2016-01-27 MED ORDER — EPHEDRINE 5 MG/ML INJ
INTRAVENOUS | Status: AC
  Filled 2016-01-27: qty 10

## 2016-01-27 MED ORDER — HEPARIN SOD (PORK) LOCK FLUSH 100 UNIT/ML IV SOLN
500.0000 [IU] | Freq: Once | INTRAVENOUS | Status: DC
  Filled 2016-01-27 (×2): qty 5

## 2016-01-27 MED ORDER — HEPARIN SOD (PORK) LOCK FLUSH 100 UNIT/ML IV SOLN
INTRAVENOUS | Status: DC | PRN
  Administered 2016-01-27: 500 [IU] via INTRAVENOUS

## 2016-01-27 MED ORDER — ONDANSETRON HCL 4 MG/2ML IJ SOLN
INTRAMUSCULAR | Status: AC
  Filled 2016-01-27: qty 2

## 2016-01-27 MED ORDER — PROPOFOL 10 MG/ML IV BOLUS
INTRAVENOUS | Status: AC
  Filled 2016-01-27: qty 20

## 2016-01-27 MED ORDER — LIDOCAINE HCL (PF) 1 % IJ SOLN: INTRAMUSCULAR | Status: DC | PRN

## 2016-01-27 MED ORDER — FENTANYL CITRATE (PF) 100 MCG/2ML IJ SOLN
INTRAMUSCULAR | Status: AC
  Administered 2016-01-27: 50 ug via INTRAVENOUS
  Filled 2016-01-27: qty 2

## 2016-01-27 MED ORDER — EPHEDRINE SULFATE 50 MG/ML IJ SOLN
INTRAMUSCULAR | Status: DC | PRN
  Administered 2016-01-27 (×2): 5 mg via INTRAVENOUS

## 2016-01-27 MED ORDER — HYDROCODONE-ACETAMINOPHEN 5-325 MG PO TABS: 1.0000 | ORAL_TABLET | ORAL | 0 refills | Status: DC | PRN

## 2016-01-27 MED ORDER — LIDOCAINE 2% (20 MG/ML) 5 ML SYRINGE
INTRAMUSCULAR | Status: AC
  Filled 2016-01-27: qty 5

## 2016-01-27 MED ORDER — FENTANYL CITRATE (PF) 100 MCG/2ML IJ SOLN
INTRAMUSCULAR | Status: AC
  Filled 2016-01-27: qty 2

## 2016-01-27 MED ORDER — ONDANSETRON HCL 4 MG/2ML IJ SOLN
INTRAMUSCULAR | Status: DC | PRN
  Administered 2016-01-27: 4 mg via INTRAVENOUS

## 2016-01-27 MED ORDER — LACTATED RINGERS IV SOLN
INTRAVENOUS | Status: DC | PRN
  Administered 2016-01-27: 14:00:00 via INTRAVENOUS

## 2016-01-27 MED ORDER — BUPIVACAINE HCL (PF) 0.25 % IJ SOLN
INTRAMUSCULAR | Status: AC
  Filled 2016-01-27: qty 30

## 2016-01-27 MED ORDER — SODIUM CHLORIDE 0.9 % IV SOLN
Freq: Once | INTRAVENOUS | Status: DC
  Filled 2016-01-27: qty 1.2

## 2016-01-27 MED ORDER — OXYCODONE HCL 5 MG PO TABS: 5.0000 mg | ORAL_TABLET | ORAL | Status: DC | PRN

## 2016-01-27 MED ORDER — CEFAZOLIN SODIUM-DEXTROSE 2-4 GM/100ML-% IV SOLN
2.0000 g | INTRAVENOUS | Status: AC
  Administered 2016-01-27: 2 g via INTRAVENOUS
  Filled 2016-01-27: qty 100

## 2016-01-27 MED ORDER — FENTANYL CITRATE (PF) 100 MCG/2ML IJ SOLN
25.0000 ug | INTRAMUSCULAR | Status: DC | PRN
  Administered 2016-01-27 (×2): 50 ug via INTRAVENOUS

## 2016-01-27 MED ORDER — LACTATED RINGERS IV SOLN
INTRAVENOUS | Status: DC
  Administered 2016-01-27: 16:00:00 via INTRAVENOUS

## 2016-01-27 MED ORDER — ACETAMINOPHEN 650 MG RE SUPP
650.0000 mg | RECTAL | Status: DC | PRN
  Filled 2016-01-27: qty 1

## 2016-01-27 MED ORDER — SODIUM CHLORIDE 0.9% FLUSH: 3.0000 mL | Freq: Two times a day (BID) | INTRAVENOUS | Status: DC

## 2016-01-27 MED ORDER — LACTATED RINGERS IV SOLN: INTRAVENOUS | Status: DC

## 2016-01-27 MED ORDER — METOCLOPRAMIDE HCL 5 MG/ML IJ SOLN: 10.0000 mg | Freq: Once | INTRAMUSCULAR | Status: DC | PRN

## 2016-01-27 MED ORDER — HEPARIN SODIUM (PORCINE) 5000 UNIT/ML IJ SOLN
5000.0000 [IU] | Freq: Once | INTRAMUSCULAR | Status: AC
  Administered 2016-01-27: 5000 [IU] via SUBCUTANEOUS
  Filled 2016-01-27: qty 1

## 2016-01-27 MED ORDER — SODIUM CHLORIDE 0.9 % IV SOLN
INTRAVENOUS | Status: DC | PRN
  Administered 2016-01-27: 500 mL

## 2016-01-27 MED ORDER — CHLORHEXIDINE GLUCONATE CLOTH 2 % EX PADS
6.0000 | MEDICATED_PAD | Freq: Once | CUTANEOUS | Status: AC
  Administered 2016-01-27: 6 via TOPICAL

## 2016-01-27 MED ORDER — PROPOFOL 10 MG/ML IV BOLUS
INTRAVENOUS | Status: DC | PRN
  Administered 2016-01-27: 200 mg via INTRAVENOUS
  Administered 2016-01-27: 50 mg via INTRAVENOUS

## 2016-01-27 MED ORDER — SODIUM CHLORIDE 0.9 % IV SOLN: 250.0000 mL | INTRAVENOUS | Status: DC | PRN

## 2016-01-27 MED ORDER — MEPERIDINE HCL 50 MG/ML IJ SOLN: 6.2500 mg | INTRAMUSCULAR | Status: DC | PRN

## 2016-01-27 MED ORDER — SODIUM CHLORIDE 0.9% FLUSH: 3.0000 mL | INTRAVENOUS | Status: DC | PRN

## 2016-01-27 MED ORDER — FENTANYL CITRATE (PF) 100 MCG/2ML IJ SOLN
INTRAMUSCULAR | Status: DC | PRN
  Administered 2016-01-27 (×4): 25 ug via INTRAVENOUS

## 2016-01-27 MED ORDER — BUPIVACAINE HCL (PF) 0.5 % IJ SOLN
INTRAMUSCULAR | Status: AC
  Filled 2016-01-27: qty 30

## 2016-01-27 MED ORDER — ACETAMINOPHEN 325 MG PO TABS
650.0000 mg | ORAL_TABLET | ORAL | Status: DC | PRN
  Administered 2016-01-27: 650 mg via ORAL
  Filled 2016-01-27: qty 2

## 2016-01-27 MED ORDER — LIDOCAINE HCL 1 % IJ SOLN
20.0000 mL | Freq: Once | INTRAMUSCULAR | Status: DC
  Filled 2016-01-27: qty 20

## 2016-01-27 MED ORDER — LIDOCAINE 2% (20 MG/ML) 5 ML SYRINGE
INTRAMUSCULAR | Status: DC | PRN
  Administered 2016-01-27: 50 mg via INTRAVENOUS

## 2016-01-27 MED ORDER — MIDAZOLAM HCL 5 MG/5ML IJ SOLN
INTRAMUSCULAR | Status: DC | PRN
  Administered 2016-01-27: 2 mg via INTRAVENOUS

## 2016-01-27 SURGICAL SUPPLY — 40 items
ADH SKN CLS APL DERMABOND .7 (GAUZE/BANDAGES/DRESSINGS) ×1
APL SKNCLS STERI-STRIP NONHPOA (GAUZE/BANDAGES/DRESSINGS)
BAG DECANTER FOR FLEXI CONT (MISCELLANEOUS) ×2 IMPLANT
BENZOIN TINCTURE PRP APPL 2/3 (GAUZE/BANDAGES/DRESSINGS) IMPLANT
BLADE HEX COATED 2.75 (ELECTRODE) ×2 IMPLANT
BLADE SURG 15 STRL LF DISP TIS (BLADE) ×1 IMPLANT
BLADE SURG 15 STRL SS (BLADE) ×2
COVER SURGICAL LIGHT HANDLE (MISCELLANEOUS) ×2 IMPLANT
DECANTER SPIKE VIAL GLASS SM (MISCELLANEOUS) ×2 IMPLANT
DERMABOND ADVANCED (GAUZE/BANDAGES/DRESSINGS) ×1
DERMABOND ADVANCED .7 DNX12 (GAUZE/BANDAGES/DRESSINGS) IMPLANT
DRAPE C-ARM 42X120 X-RAY (DRAPES) ×2 IMPLANT
DRAPE LAPAROTOMY T 98X78 PEDS (DRAPES) ×2 IMPLANT
ELECT PENCIL ROCKER SW 15FT (MISCELLANEOUS) ×2 IMPLANT
ELECT REM PT RETURN 9FT ADLT (ELECTROSURGICAL) ×2
ELECTRODE REM PT RTRN 9FT ADLT (ELECTROSURGICAL) ×1 IMPLANT
GAUZE SPONGE 2X2 8PLY STRL LF (GAUZE/BANDAGES/DRESSINGS) IMPLANT
GAUZE SPONGE 4X4 12PLY STRL (GAUZE/BANDAGES/DRESSINGS) ×2 IMPLANT
GAUZE SPONGE 4X4 16PLY XRAY LF (GAUZE/BANDAGES/DRESSINGS) ×2 IMPLANT
GLOVE BIOGEL M 8.0 STRL (GLOVE) ×2 IMPLANT
GLOVE BIOGEL PI IND STRL 7.0 (GLOVE) ×1 IMPLANT
GLOVE BIOGEL PI INDICATOR 7.0 (GLOVE) ×1
GOWN SPEC L4 XLG W/TWL (GOWN DISPOSABLE) ×2 IMPLANT
GOWN STRL REUS W/TWL XL LVL3 (GOWN DISPOSABLE) ×6 IMPLANT
KIT BASIN OR (CUSTOM PROCEDURE TRAY) ×2 IMPLANT
KIT PORT POWER 8FR ISP CVUE (Catheter) ×1 IMPLANT
NEEDLE HYPO 22GX1.5 SAFETY (NEEDLE) ×2 IMPLANT
NS IRRIG 1000ML POUR BTL (IV SOLUTION) ×2 IMPLANT
PACK BASIC VI WITH GOWN DISP (CUSTOM PROCEDURE TRAY) ×2 IMPLANT
SPONGE GAUZE 2X2 STER 10/PKG (GAUZE/BANDAGES/DRESSINGS)
STRIP CLOSURE SKIN 1/2X4 (GAUZE/BANDAGES/DRESSINGS) IMPLANT
SUT PROLENE 2 0 CT2 30 (SUTURE) IMPLANT
SUT PROLENE 2 0 SH DA (SUTURE) IMPLANT
SUT VIC AB 4-0 SH 18 (SUTURE) ×2 IMPLANT
SYR 10ML ECCENTRIC (SYRINGE) ×2 IMPLANT
SYR 10ML LL (SYRINGE) ×2 IMPLANT
SYR BULB IRRIGATION 50ML (SYRINGE) IMPLANT
SYR CONTROL 10ML LL (SYRINGE) ×2 IMPLANT
TOWEL OR 17X26 10 PK STRL BLUE (TOWEL DISPOSABLE) ×2 IMPLANT
TOWEL OR NON WOVEN STRL DISP B (DISPOSABLE) ×2 IMPLANT

## 2016-01-27 NOTE — Anesthesia Postprocedure Evaluation (Signed)
Anesthesia Post Note  Patient: Vincent Strickland  Procedure(s) Performed: Procedure(s) (LRB): INSERTION PORT-A-CATH (N/A)  Patient location during evaluation: PACU Anesthesia Type: General Level of consciousness: awake and alert Pain management: pain level controlled Vital Signs Assessment: post-procedure vital signs reviewed and stable Respiratory status: spontaneous breathing, nonlabored ventilation, respiratory function stable and patient connected to nasal cannula oxygen Cardiovascular status: blood pressure returned to baseline and stable Postop Assessment: no signs of nausea or vomiting Anesthetic complications: no       Last Vitals:  Vitals:   01/27/16 1613 01/27/16 1615  BP: (!) 140/93   Pulse: (!) 103   Resp: 12 13  Temp: 36.7 C     Last Pain:  Vitals:   01/27/16 1615  TempSrc:   PainSc: 5                  Montez Hageman

## 2016-01-27 NOTE — Interval H&P Note (Signed)
History and Physical Interval Note:  01/27/2016 2:24 PM  Vincent Strickland  has presented today for surgery, with the diagnosis of S/P LAP SIGMOID COLECTOMY  The various methods of treatment have been discussed with the patient and family. After consideration of risks, benefits and other options for treatment, the patient has consented to  Procedure(s): INSERTION PORT-A-CATH (N/A) as a surgical intervention .  The patient's history has been reviewed, patient examined, no change in status, stable for surgery.  I have reviewed the patient's chart and labs.  Questions were answered to the patient's satisfaction.     Yong Grieser B

## 2016-01-27 NOTE — Anesthesia Preprocedure Evaluation (Signed)
Anesthesia Evaluation  Patient identified by MRN, date of birth, ID band Patient awake    Reviewed: Allergy & Precautions, NPO status , Patient's Chart, lab work & pertinent test results  Airway Mallampati: II  TM Distance: >3 FB Neck ROM: Full    Dental no notable dental hx.    Pulmonary neg pulmonary ROS,    Pulmonary exam normal breath sounds clear to auscultation       Cardiovascular hypertension, Pt. on medications Normal cardiovascular exam Rhythm:Regular Rate:Normal     Neuro/Psych negative neurological ROS  negative psych ROS   GI/Hepatic negative GI ROS, Neg liver ROS,   Endo/Other  negative endocrine ROS  Renal/GU negative Renal ROS  negative genitourinary   Musculoskeletal negative musculoskeletal ROS (+)   Abdominal   Peds negative pediatric ROS (+)  Hematology negative hematology ROS (+)   Anesthesia Other Findings   Reproductive/Obstetrics negative OB ROS                            Anesthesia Physical Anesthesia Plan  ASA: II  Anesthesia Plan: General   Post-op Pain Management:    Induction: Intravenous  Airway Management Planned: LMA  Additional Equipment:   Intra-op Plan:   Post-operative Plan: Extubation in OR  Informed Consent: I have reviewed the patients History and Physical, chart, labs and discussed the procedure including the risks, benefits and alternatives for the proposed anesthesia with the patient or authorized representative who has indicated his/her understanding and acceptance.   Dental advisory given  Plan Discussed with: CRNA  Anesthesia Plan Comments:         Anesthesia Quick Evaluation

## 2016-01-27 NOTE — H&P (Signed)
Chief Complaint:  Need for IV access  History of Present Illness:  Vincent Strickland is an 51 y.o. male who underwent lap assisted low anterior resection with + nodes.  Needs portacath for IV access  Past Medical History:  Diagnosis Date  . 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 Headache    occas migraines  . Hemorrhoid   . High cholesterol   . History of bladder infections   . History of colon cancer, stage III 01/07/2016  . History of kidney stones   . Hypertension   . Hypogonadism male   . Renal disorder    kidney stones  . Vertigo, benign positional     Past Surgical History:  Procedure Laterality Date  . COLONOSCOPY WITH PROPOFOL Left 12/31/2015   Procedure: COLONOSCOPY WITH PROPOFOL;  Surgeon: Arta Silence, MD;  Location: [facility];  Service: Endoscopy;  Laterality: Left;  . LAPAROSCOPIC SIGMOID COLECTOMY N/A 01/01/2016   Procedure: LAPAROSCOPIC ASSISTED SIGMOID COLECTOMY;  Surgeon: Johnathan Hausen, MD;  Location: [facility] ORS;  Service: General;  Laterality: N/A;  . LITHOTRIPSY    . TONSILLECTOMY      Current Facility-Administered Medications  Medication Dose Route Frequency Provider Last Rate Last Dose  . ceFAZolin (ANCEF) IVPB 2g/100 mL premix  2 g Intravenous On Call to OR Johnathan Hausen, MD       Facility-Administered Medications Ordered in Other Encounters  Medication Dose Route Frequency Provider Last Rate Last Dose  . lactated ringers infusion    Continuous PRN Anne Fu, CRNA       Niaspan [niacin] and Percocet [oxycodone-acetaminophen] Family History  Problem Relation Age of Onset  . Hypertension Mother   . Cancer Father     prostate  . Migraines Father   . Hypertension Maternal Grandmother   . Diabetes Paternal Grandfather   . COPD Paternal Grandfather    Social History:   reports that he has never smoked. He has never used smokeless tobacco. He reports that he does not drink alcohol or use  drugs.   REVIEW OF SYSTEMS : Negative except for see problem list  Physical Exam:   Blood pressure (!) 135/96, pulse 83, temperature 99.3 F (37.4 C), temperature source Oral, resp. rate 18, height 6\' 1"  (1.854 m), weight 107 kg (236 lb), SpO2 100 %. Body mass index is 31.14 kg/m.  Gen:  WDWN WM NAD  Neurological: Alert and oriented to person, place, and time. Motor and sensory function is grossly intact  Head: Normocephalic and atraumatic.  Eyes: Conjunctivae are normal. Pupils are equal, round, and reactive to light. No scleral icterus.  Neck: Normal range of motion. Neck supple. No tracheal deviation or thyromegaly present.  Cardiovascular:  SR without murmurs or gallops.  No carotid bruits Breast:  Not examined Respiratory: Effort normal.  No respiratory distress. No chest wall tenderness. Breath sounds normal.  No wheezes, rales or rhonchi.  Abdomen:  Lower midline incision is well healing GU:  unremarkable Musculoskeletal: Normal range of motion. Extremities are nontender. No cyanosis, edema or clubbing noted Lymphadenopathy: No cervical, preauricular, postauricular or axillary adenopathy is present Skin: Skin is warm and dry. No rash noted. No diaphoresis. No erythema. No pallor. Pscyh: Normal mood and affect. Behavior is normal. Judgment and thought content normal.   LABORATORY RESULTS: No results found for this or any previous visit (from the past 48 hour(s)).   RADIOLOGY RESULTS: No results found.  Problem List: Patient Active Problem List   Diagnosis  Date Noted  . History of colon cancer, stage III 01/07/2016  . 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 GI bleed 12/31/2015  . Bright red blood per rectum 12/30/2015  . Hemorrhoids 12/30/2015  . Renal disorder   . Essential hypertension   . Pure hypercholesterolemia   . Hypogonadism male     Assessment & Plan: Colon cancer with need for IV access.      Matt B.  Hassell Done, MD, [facility] [phone] beeper [phone]  01/27/2016 2:22 PM

## 2016-01-27 NOTE — Op Note (Signed)
Surgeon: Kaylyn Lim, MD, FACS  Asst:  none  Anes:  General by LMA   Procedure: Insertion of left subclavian portacath  Diagnosis: Cancer of the colon  Complications: None noted  EBL:   4 cc  Drains: none  Description of Procedure:  The patient was taken to OR 6  at [facility].  After anesthesia was administered and the patient was prepped a timeout was performed.  The patient was placed in [place] and the left subclavian vein was cannulated on the first pass and the wire deployed under C arm observation.  A pocket was fashioned and the catheter tunneled from the pocket to the insertion site of the wire.    The peel away sheath was placed over the wire and the wire/obturator were withdrawn.  The preflushed catheter was passed through the peel away sheath and then peeled away.  The catheter was at about 20 cm and appeared to be at the cavo-atrial junction.  No catheter whip was noted.  It flushed easily with heparinized saline.  The port was attached and sewn into the pocket with two sutures of 2-0 prolene.  The skin was closed with 4-0 vicryl and Dermabond and the port was accessed with a right angle Charisse March and flushed with concentrated aqueous heparin.    The patient tolerated the procedure well and was taken to the PACU in stable condition.     Matt B. Hassell Done, [place], [facility], [place]

## 2016-01-27 NOTE — Transfer of Care (Signed)
Immediate Anesthesia Transfer of Care Note  Patient: Vincent Strickland  Procedure(s) Performed: Procedure(s): INSERTION PORT-A-CATH (N/A)  Patient Location: PACU  Anesthesia Type:General  Level of Consciousness:  sedated, patient cooperative and responds to stimulation  Airway & Oxygen Therapy:Patient Spontanous Breathing and Patient connected to face mask oxgen  Post-op Assessment:  Report given to PACU RN and Post -op Vital signs reviewed and stable  Post vital signs:  Reviewed and stable  Last Vitals:  Vitals:   01/27/16 1330  BP: (!) 135/96  Pulse: 83  Resp: 18  Temp: 0000000 C    Complications: No apparent anesthesia complications

## 2016-01-27 NOTE — Discharge Instructions (Signed)
General Anesthesia, Adult, Care After These instructions provide you with information about caring for yourself after your procedure. Your health care provider may also give you more specific instructions. Your treatment has been planned according to current medical practices, but problems sometimes occur. Call your health care provider if you have any problems or questions after your procedure. What can I expect after the procedure? After the procedure, it is common to have: Vomiting. A sore throat. Mental slowness. It is common to feel: Nauseous. Cold or shivery. Sleepy. Tired. Sore or achy, even in parts of your body where you did not have surgery. Follow these instructions at home: For at least 24 hours after the procedure: Do not: Participate in activities where you could fall or become injured. Drive. Use heavy machinery. Drink alcohol. Take sleeping pills or medicines that cause drowsiness. Make important decisions or sign legal documents. Take care of children on your own. Rest. Eating and drinking If you vomit, drink water, juice, or soup when you can drink without vomiting. Drink enough fluid to keep your urine clear or pale yellow. Make sure you have little or no nausea before eating solid foods. Follow the diet recommended by your health care provider. General instructions Have a responsible adult stay with you until you are awake and alert. Return to your normal activities as told by your health care provider. Ask your health care provider what activities are safe for you. Take over-the-counter and prescription medicines only as told by your health care provider. If you smoke, do not smoke without supervision. Keep all follow-up visits as told by your health care provider. This is important. Contact a health care provider if: You continue to have nausea or vomiting at home, and medicines are not helpful. You cannot drink fluids or start eating again. You cannot  urinate after 8-12 hours. You develop a skin rash. You have fever. You have increasing redness at the site of your procedure. Get help right away if: You have difficulty breathing. You have chest pain. You have unexpected bleeding. You feel that you are having a life-threatening or urgent problem. This information is not intended to replace advice given to you by your health care provider. Make sure you discuss any questions you have with your health care provider. Document Released: 04/19/2000 Document Revised: 06/16/2015 Document Reviewed: 12/26/2014 Elsevier Interactive Patient Education  2017 [place] Insertion, Care After Refer to this sheet in the next few weeks. These instructions provide you with information on caring for yourself after your procedure. Your health care provider may also give you more specific instructions. Your treatment has been planned according to current medical practices, but problems sometimes occur. Call your health care provider if you have any problems or questions after your procedure. WHAT TO EXPECT AFTER THE PROCEDURE After your procedure, it is typical to have the following:   Discomfort at the port insertion site. Ice packs to the area will help.  Bruising on the skin over the port. This will subside in 3-4 days. HOME CARE INSTRUCTIONS  After your port is placed, you will get a manufacturer's information card. The card has information about your port. Keep this card with you at all times.   Know what kind of port you have. There are many types of ports available.   Wear a medical alert bracelet in case of an emergency. This can help alert health care workers that you have a port.   The port can stay  in for as long as your health care provider believes it is necessary.   A home health care nurse may give medicines and take care of the port.   You or a family member can get special training and directions for giving  medicine and taking care of the port at home.  SEEK MEDICAL CARE IF:   Your port does not flush or you are unable to get a blood return.   You have a fever or chills. SEEK IMMEDIATE MEDICAL CARE IF:  You have new fluid or pus coming from your incision.   You notice a bad smell coming from your incision site.   You have swelling, pain, or more redness at the incision or port site.   You have chest pain or shortness of breath. This information is not intended to replace advice given to you by your health care provider. Make sure you discuss any questions you have with your health care provider. Document Released: 11/01/2012 Document Revised: 01/16/2013 Document Reviewed: 11/01/2012 Elsevier Interactive Patient Education  2017 [name] has been flushed with concentrated aqueous heparin.

## 2016-01-27 NOTE — Anesthesia Procedure Notes (Addendum)
Procedure Name: LMA Insertion Date/Time: 01/27/2016 2:47 PM Performed by: Anne Fu Pre-anesthesia Checklist: Patient identified, Emergency Drugs available, Suction available, Patient being monitored and Timeout performed Patient Re-evaluated:Patient Re-evaluated prior to inductionOxygen Delivery Method: Circle system utilized Preoxygenation: Pre-oxygenation with 100% oxygen Intubation Type: IV induction Ventilation: Mask ventilation without difficulty LMA: LMA inserted LMA Size: 5.0 Number of attempts: 1 Placement Confirmation: positive ETCO2 and breath sounds checked- equal and bilateral Tube secured with: Tape

## 2016-01-28 ENCOUNTER — Other Ambulatory Visit: Payer: Self-pay | Admitting: *Deleted

## 2016-01-28 ENCOUNTER — Ambulatory Visit: Payer: BLUE CROSS/BLUE SHIELD

## 2016-01-28 ENCOUNTER — Encounter (HOSPITAL_COMMUNITY): Payer: Self-pay | Admitting: Surgery

## 2016-01-28 ENCOUNTER — Ambulatory Visit (HOSPITAL_BASED_OUTPATIENT_CLINIC_OR_DEPARTMENT_OTHER): Payer: BLUE CROSS/BLUE SHIELD

## 2016-01-28 ENCOUNTER — Other Ambulatory Visit (HOSPITAL_BASED_OUTPATIENT_CLINIC_OR_DEPARTMENT_OTHER): Payer: BLUE CROSS/BLUE SHIELD

## 2016-01-28 DIAGNOSIS — C772 Secondary and unspecified malignant neoplasm of intra-abdominal lymph nodes: Secondary | ICD-10-CM | POA: Diagnosis not present

## 2016-01-28 DIAGNOSIS — Z5111 Encounter for antineoplastic chemotherapy: Secondary | ICD-10-CM | POA: Diagnosis not present

## 2016-01-28 DIAGNOSIS — C187 Malignant neoplasm of sigmoid colon: Secondary | ICD-10-CM

## 2016-01-28 DIAGNOSIS — Z85038 Personal history of other malignant neoplasm of large intestine: Secondary | ICD-10-CM

## 2016-01-28 LAB — CBC WITH DIFFERENTIAL (CANCER CENTER ONLY)
BASO#: 0 10*3/uL (ref 0.0–0.2)
BASO%: 0.3 % (ref 0.0–2.0)
EOS ABS: 0.1 10*3/uL (ref 0.0–0.5)
EOS%: 0.9 % (ref 0.0–7.0)
HEMATOCRIT: 35.3 % — AB (ref 38.7–49.9)
HGB: 12.3 g/dL — ABNORMAL LOW (ref 13.0–17.1)
LYMPH#: 1.8 10*3/uL (ref 0.9–3.3)
LYMPH%: 25.8 % (ref 14.0–48.0)
MCH: 30.2 pg (ref 28.0–33.4)
MCHC: 34.8 g/dL (ref 32.0–35.9)
MCV: 87 fL (ref 82–98)
MONO#: 0.5 10*3/uL (ref 0.1–0.9)
MONO%: 7.4 % (ref 0.0–13.0)
NEUT#: 4.5 10*3/uL (ref 1.5–6.5)
NEUT%: 65.6 % (ref 40.0–80.0)
Platelets: 293 10*3/uL (ref 145–400)
RBC: 4.07 10*6/uL — ABNORMAL LOW (ref 4.20–5.70)
RDW: 12.8 % (ref 11.1–15.7)
WBC: 6.9 10*3/uL (ref 4.0–10.0)

## 2016-01-28 LAB — CMP (CANCER CENTER ONLY)
ALK PHOS: 74 U/L (ref 26–84)
ALT: 44 U/L (ref 10–47)
AST: 27 U/L (ref 11–38)
Albumin: 3.5 g/dL (ref 3.3–5.5)
BUN, Bld: 9 mg/dL (ref 7–22)
CALCIUM: 9.1 mg/dL (ref 8.0–10.3)
CO2: 28 mEq/L (ref 18–33)
Chloride: 107 mEq/L (ref 98–108)
Creat: 1.2 mg/dl (ref 0.6–1.2)
GLUCOSE: 129 mg/dL — AB (ref 73–118)
POTASSIUM: 3.2 meq/L — AB (ref 3.3–4.7)
Sodium: 143 mEq/L (ref 128–145)
Total Bilirubin: 0.7 mg/dl (ref 0.20–1.60)
Total Protein: 6.9 g/dL (ref 6.4–8.1)

## 2016-01-28 MED ORDER — PALONOSETRON HCL INJECTION 0.25 MG/5ML
INTRAVENOUS | Status: AC
  Filled 2016-01-28: qty 5

## 2016-01-28 MED ORDER — LIDOCAINE-PRILOCAINE 2.5-2.5 % EX CREA: TOPICAL_CREAM | CUTANEOUS | 3 refills | Status: DC

## 2016-01-28 MED ORDER — DEXTROSE 5 % IV SOLN
Freq: Once | INTRAVENOUS | Status: AC
  Administered 2016-01-28: 10:00:00 via INTRAVENOUS

## 2016-01-28 MED ORDER — FLUOROURACIL CHEMO INJECTION 2.5 GM/50ML
400.0000 mg/m2 | Freq: Once | INTRAVENOUS | Status: AC
  Administered 2016-01-28: 950 mg via INTRAVENOUS
  Filled 2016-01-28: qty 19

## 2016-01-28 MED ORDER — PALONOSETRON HCL INJECTION 0.25 MG/5ML
0.2500 mg | Freq: Once | INTRAVENOUS | Status: AC
  Administered 2016-01-28: 0.25 mg via INTRAVENOUS

## 2016-01-28 MED ORDER — DEXTROSE 5 % IV SOLN
400.0000 mg/m2 | Freq: Once | INTRAVENOUS | Status: AC
  Administered 2016-01-28: 936 mg via INTRAVENOUS
  Filled 2016-01-28: qty 46.8

## 2016-01-28 MED ORDER — HEPARIN SOD (PORK) LOCK FLUSH 100 UNIT/ML IV SOLN
500.0000 [IU] | Freq: Once | INTRAVENOUS | Status: DC | PRN
  Filled 2016-01-28: qty 5

## 2016-01-28 MED ORDER — DEXAMETHASONE 4 MG PO TABS: 8.0000 mg | ORAL_TABLET | Freq: Every day | ORAL | 1 refills | Status: DC

## 2016-01-28 MED ORDER — ONDANSETRON HCL 8 MG PO TABS: 8.0000 mg | ORAL_TABLET | Freq: Two times a day (BID) | ORAL | 1 refills | Status: DC | PRN

## 2016-01-28 MED ORDER — SODIUM CHLORIDE 0.9% FLUSH
10.0000 mL | INTRAVENOUS | Status: DC | PRN
  Filled 2016-01-28: qty 10

## 2016-01-28 MED ORDER — LORAZEPAM 0.5 MG PO TABS: 0.5000 mg | ORAL_TABLET | Freq: Four times a day (QID) | ORAL | 0 refills | Status: DC | PRN

## 2016-01-28 MED ORDER — OXALIPLATIN CHEMO INJECTION 100 MG/20ML
85.0000 mg/m2 | Freq: Once | INTRAVENOUS | Status: AC
  Administered 2016-01-28: 200 mg via INTRAVENOUS
  Filled 2016-01-28: qty 20

## 2016-01-28 MED ORDER — PROCHLORPERAZINE MALEATE 10 MG PO TABS: 10.0000 mg | ORAL_TABLET | Freq: Four times a day (QID) | ORAL | 1 refills | Status: DC | PRN

## 2016-01-28 MED ORDER — DEXAMETHASONE SODIUM PHOSPHATE 10 MG/ML IJ SOLN
10.0000 mg | Freq: Once | INTRAMUSCULAR | Status: AC
  Administered 2016-01-28: 10 mg via INTRAVENOUS

## 2016-01-28 MED ORDER — DEXAMETHASONE SODIUM PHOSPHATE 10 MG/ML IJ SOLN
INTRAMUSCULAR | Status: AC
  Filled 2016-01-28: qty 1

## 2016-01-28 MED ORDER — SODIUM CHLORIDE 0.9 % IV SOLN
2400.0000 mg/m2 | INTRAVENOUS | Status: DC
  Administered 2016-01-28: 5600 mg via INTRAVENOUS
  Filled 2016-01-28: qty 112

## 2016-01-28 NOTE — Patient Instructions (Signed)
[facility] Discharge Instructions for Patients Receiving Chemotherapy  Today you received the following chemotherapy agents Oxaliplatin, 5FU, Leucovorin  To help prevent nausea and vomiting after your treatment, we encourage you to take your nausea medication Zofran, Compazine, Decadron & Ativan.    If you develop nausea and vomiting that is not controlled by your nausea medication, call the clinic.   BELOW ARE SYMPTOMS THAT SHOULD BE REPORTED IMMEDIATELY:  *FEVER GREATER THAN 100.5 F  *CHILLS WITH OR WITHOUT FEVER  NAUSEA AND VOMITING THAT IS NOT CONTROLLED WITH YOUR NAUSEA MEDICATION  *UNUSUAL SHORTNESS OF BREATH  *UNUSUAL BRUISING OR BLEEDING  TENDERNESS IN MOUTH AND THROAT WITH OR WITHOUT PRESENCE OF ULCERS  *URINARY PROBLEMS  *BOWEL PROBLEMS  UNUSUAL RASH Items with * indicate a potential emergency and should be followed up as soon as possible.  Feel free to call the clinic you have any questions or concerns. The clinic phone number is [phone].  Please show the [place] at check-in to the Emergency Department and triage nurse.  Leucovorin injection What is this medicine? LEUCOVORIN (loo koe VOR in) is used to prevent or treat the harmful effects of some medicines. This medicine is used to treat anemia caused by a low amount of folic acid in the body. It is also used with 5-fluorouracil (5-FU) to treat colon cancer. This medicine may be used for other purposes; ask your health care provider or pharmacist if you have questions. What should I tell my health care provider before I take this medicine? They need to know if you have any of these conditions: -anemia from low levels of vitamin B-12 in the blood -an unusual or allergic reaction to leucovorin, folic acid, other medicines, foods, dyes, or preservatives -pregnant or trying to get pregnant -breast-feeding How should I use this medicine? This medicine is for injection into a muscle or  into a vein. It is given by a health care professional in a hospital or clinic setting. Talk to your pediatrician regarding the use of this medicine in children. Special care may be needed. Overdosage: If you think you have taken too much of this medicine contact a poison control center or emergency room at once. NOTE: This medicine is only for you. Do not share this medicine with others. What if I miss a dose? This does not apply. What may interact with this medicine? -capecitabine -fluorouracil -phenobarbital -phenytoin -primidone -trimethoprim-sulfamethoxazole This list may not describe all possible interactions. Give your health care provider a list of all the medicines, herbs, non-prescription drugs, or dietary supplements you use. Also tell them if you smoke, drink alcohol, or use illegal drugs. Some items may interact with your medicine. What should I watch for while using this medicine? Your condition will be monitored carefully while you are receiving this medicine. This medicine may increase the side effects of 5-fluorouracil, 5-FU. Tell your doctor or health care professional if you have diarrhea or mouth sores that do not get better or that get worse. What side effects may I notice from receiving this medicine? Side effects that you should report to your doctor or health care professional as soon as possible: -allergic reactions like skin rash, itching or hives, swelling of the face, lips, or tongue -breathing problems -fever, infection -mouth sores -unusual bleeding or bruising -unusually weak or tired Side effects that usually do not require medical attention (report to your doctor or health care professional if they continue or are bothersome): -constipation or diarrhea -loss  of appetite -nausea, vomiting This list may not describe all possible side effects. Call your doctor for medical advice about side effects. You may report side effects to FDA at 1-800-FDA-1088. Where  should I keep my medicine? This drug is given in a hospital or clinic and will not be stored at home. NOTE: This sheet is a summary. It may not cover all possible information. If you have questions about this medicine, talk to your doctor, pharmacist, or health care provider.  2017 Elsevier/Gold Standard (2007-07-18 16:50:29) Fluorouracil, 5-FU injection What is this medicine? FLUOROURACIL, 5-FU (flure oh YOOR a sil) is a chemotherapy drug. It slows the growth of cancer cells. This medicine is used to treat many types of cancer like breast cancer, colon or rectal cancer, pancreatic cancer, and stomach cancer. This medicine may be used for other purposes; ask your health care provider or pharmacist if you have questions. COMMON BRAND NAME(S): Adrucil What should I tell my health care provider before I take this medicine? They need to know if you have any of these conditions: -blood disorders -dihydropyrimidine dehydrogenase (DPD) deficiency -infection (especially a virus infection such as chickenpox, cold sores, or herpes) -kidney disease -liver disease -malnourished, poor nutrition -recent or ongoing radiation therapy -an unusual or allergic reaction to fluorouracil, other chemotherapy, other medicines, foods, dyes, or preservatives -pregnant or trying to get pregnant -breast-feeding How should I use this medicine? This drug is given as an infusion or injection into a vein. It is administered in a hospital or clinic by a specially trained health care professional. Talk to your pediatrician regarding the use of this medicine in children. Special care may be needed. Overdosage: If you think you have taken too much of this medicine contact a poison control center or emergency room at once. NOTE: This medicine is only for you. Do not share this medicine with others. What if I miss a dose? It is important not to miss your dose. Call your doctor or health care professional if you are unable to  keep an appointment. What may interact with this medicine? -allopurinol -cimetidine -dapsone -digoxin -hydroxyurea -leucovorin -levamisole -medicines for seizures like ethotoin, fosphenytoin, phenytoin -medicines to increase blood counts like filgrastim, pegfilgrastim, sargramostim -medicines that treat or prevent blood clots like warfarin, enoxaparin, and dalteparin -methotrexate -metronidazole -pyrimethamine -some other chemotherapy drugs like busulfan, cisplatin, estramustine, vinblastine -trimethoprim -trimetrexate -vaccines Talk to your doctor or health care professional before taking any of these medicines: -acetaminophen -aspirin -ibuprofen -ketoprofen -naproxen This list may not describe all possible interactions. Give your health care provider a list of all the medicines, herbs, non-prescription drugs, or dietary supplements you use. Also tell them if you smoke, drink alcohol, or use illegal drugs. Some items may interact with your medicine. What should I watch for while using this medicine? Visit your doctor for checks on your progress. This drug may make you feel generally unwell. This is not uncommon, as chemotherapy can affect healthy cells as well as cancer cells. Report any side effects. Continue your course of treatment even though you feel ill unless your doctor tells you to stop. In some cases, you may be given additional medicines to help with side effects. Follow all directions for their use. Call your doctor or health care professional for advice if you get a fever, chills or sore throat, or other symptoms of a cold or flu. Do not treat yourself. This drug decreases your body's ability to fight infections. Try to avoid being around people who are  sick. This medicine may increase your risk to bruise or bleed. Call your doctor or health care professional if you notice any unusual bleeding. Be careful brushing and flossing your teeth or using a toothpick because you  may get an infection or bleed more easily. If you have any dental work done, tell your dentist you are receiving this medicine. Avoid taking products that contain aspirin, acetaminophen, ibuprofen, naproxen, or ketoprofen unless instructed by your doctor. These medicines may hide a fever. Do not become pregnant while taking this medicine. Women should inform their doctor if they wish to become pregnant or think they might be pregnant. There is a potential for serious side effects to an unborn child. Talk to your health care professional or pharmacist for more information. Do not breast-feed an infant while taking this medicine. Men should inform their doctor if they wish to father a child. This medicine may lower sperm counts. Do not treat diarrhea with over the counter products. Contact your doctor if you have diarrhea that lasts more than 2 days or if it is severe and watery. This medicine can make you more sensitive to the sun. Keep out of the sun. If you cannot avoid being in the sun, wear protective clothing and use sunscreen. Do not use sun lamps or tanning beds/booths. What side effects may I notice from receiving this medicine? Side effects that you should report to your doctor or health care professional as soon as possible: -allergic reactions like skin rash, itching or hives, swelling of the face, lips, or tongue -low blood counts - this medicine may decrease the number of white blood cells, red blood cells and platelets. You may be at increased risk for infections and bleeding. -signs of infection - fever or chills, cough, sore throat, pain or difficulty passing urine -signs of decreased platelets or bleeding - bruising, pinpoint red spots on the skin, black, tarry stools, blood in the urine -signs of decreased red blood cells - unusually weak or tired, fainting spells, lightheadedness -breathing problems -changes in vision -chest pain -mouth sores -nausea and vomiting -pain, swelling,  redness at site where injected -pain, tingling, numbness in the hands or feet -redness, swelling, or sores on hands or feet -stomach pain -unusual bleeding Side effects that usually do not require medical attention (report to your doctor or health care professional if they continue or are bothersome): -changes in finger or toe nails -diarrhea -dry or itchy skin -hair loss -headache -loss of appetite -sensitivity of eyes to the light -stomach upset -unusually teary eyes This list may not describe all possible side effects. Call your doctor for medical advice about side effects. You may report side effects to FDA at 1-800-FDA-1088. Where should I keep my medicine? This drug is given in a hospital or clinic and will not be stored at home. NOTE: This sheet is a summary. It may not cover all possible information. If you have questions about this medicine, talk to your doctor, pharmacist, or health care provider.  2017 Elsevier/Gold Standard (2007-05-17 13:53:16) Oxaliplatin Injection What is this medicine? OXALIPLATIN (ox AL i PLA tin) is a chemotherapy drug. It targets fast dividing cells, like cancer cells, and causes these cells to die. This medicine is used to treat cancers of the colon and rectum, and many other cancers. This medicine may be used for other purposes; ask your health care provider or pharmacist if you have questions. COMMON BRAND NAME(S): Eloxatin What should I tell my health care provider before I take  this medicine? They need to know if you have any of these conditions: -kidney disease -an unusual or allergic reaction to oxaliplatin, other chemotherapy, other medicines, foods, dyes, or preservatives -pregnant or trying to get pregnant -breast-feeding How should I use this medicine? This drug is given as an infusion into a vein. It is administered in a hospital or clinic by a specially trained health care professional. Talk to your pediatrician regarding the use of  this medicine in children. Special care may be needed. Overdosage: If you think you have taken too much of this medicine contact a poison control center or emergency room at once. NOTE: This medicine is only for you. Do not share this medicine with others. What if I miss a dose? It is important not to miss a dose. Call your doctor or health care professional if you are unable to keep an appointment. What may interact with this medicine? -medicines to increase blood counts like filgrastim, pegfilgrastim, sargramostim -probenecid -some antibiotics like amikacin, gentamicin, neomycin, polymyxin B, streptomycin, tobramycin -zalcitabine Talk to your doctor or health care professional before taking any of these medicines: -acetaminophen -aspirin -ibuprofen -ketoprofen -naproxen This list may not describe all possible interactions. Give your health care provider a list of all the medicines, herbs, non-prescription drugs, or dietary supplements you use. Also tell them if you smoke, drink alcohol, or use illegal drugs. Some items may interact with your medicine. What should I watch for while using this medicine? Your condition will be monitored carefully while you are receiving this medicine. You will need important blood work done while you are taking this medicine. This medicine can make you more sensitive to cold. Do not drink cold drinks or use ice. Cover exposed skin before coming in contact with cold temperatures or cold objects. When out in cold weather wear warm clothing and cover your mouth and nose to warm the air that goes into your lungs. Tell your doctor if you get sensitive to the cold. This drug may make you feel generally unwell. This is not uncommon, as chemotherapy can affect healthy cells as well as cancer cells. Report any side effects. Continue your course of treatment even though you feel ill unless your doctor tells you to stop. In some cases, you may be given additional medicines  to help with side effects. Follow all directions for their use. Call your doctor or health care professional for advice if you get a fever, chills or sore throat, or other symptoms of a cold or flu. Do not treat yourself. This drug decreases your body's ability to fight infections. Try to avoid being around people who are sick. This medicine may increase your risk to bruise or bleed. Call your doctor or health care professional if you notice any unusual bleeding. Be careful brushing and flossing your teeth or using a toothpick because you may get an infection or bleed more easily. If you have any dental work done, tell your dentist you are receiving this medicine. Avoid taking products that contain aspirin, acetaminophen, ibuprofen, naproxen, or ketoprofen unless instructed by your doctor. These medicines may hide a fever. Do not become pregnant while taking this medicine. Women should inform their doctor if they wish to become pregnant or think they might be pregnant. There is a potential for serious side effects to an unborn child. Talk to your health care professional or pharmacist for more information. Do not breast-feed an infant while taking this medicine. Call your doctor or health care professional if you  get diarrhea. Do not treat yourself. What side effects may I notice from receiving this medicine? Side effects that you should report to your doctor or health care professional as soon as possible: -allergic reactions like skin rash, itching or hives, swelling of the face, lips, or tongue -low blood counts - This drug may decrease the number of white blood cells, red blood cells and platelets. You may be at increased risk for infections and bleeding. -signs of infection - fever or chills, cough, sore throat, pain or difficulty passing urine -signs of decreased platelets or bleeding - bruising, pinpoint red spots on the skin, black, tarry stools, nosebleeds -signs of decreased red blood cells -  unusually weak or tired, fainting spells, lightheadedness -breathing problems -chest pain, pressure -cough -diarrhea -jaw tightness -mouth sores -nausea and vomiting -pain, swelling, redness or irritation at the injection site -pain, tingling, numbness in the hands or feet -problems with balance, talking, walking -redness, blistering, peeling or loosening of the skin, including inside the mouth -trouble passing urine or change in the amount of urine Side effects that usually do not require medical attention (report to your doctor or health care professional if they continue or are bothersome): -changes in vision -constipation -hair loss -loss of appetite -metallic taste in the mouth or changes in taste -stomach pain This list may not describe all possible side effects. Call your doctor for medical advice about side effects. You may report side effects to FDA at 1-800-FDA-1088. Where should I keep my medicine? This drug is given in a hospital or clinic and will not be stored at home. NOTE: This sheet is a summary. It may not cover all possible information. If you have questions about this medicine, talk to your doctor, pharmacist, or health care provider.  2017 Elsevier/Gold Standard (2007-08-08 17:22:47)

## 2016-01-29 ENCOUNTER — Encounter: Payer: Self-pay | Admitting: Hematology & Oncology

## 2016-01-29 ENCOUNTER — Telehealth: Payer: Self-pay

## 2016-01-29 NOTE — Telephone Encounter (Signed)
Patient was unable to be reached; will follow up tomorrow when he returns to clinic.

## 2016-01-30 ENCOUNTER — Ambulatory Visit (HOSPITAL_BASED_OUTPATIENT_CLINIC_OR_DEPARTMENT_OTHER): Payer: BLUE CROSS/BLUE SHIELD

## 2016-01-30 ENCOUNTER — Other Ambulatory Visit: Payer: Self-pay | Admitting: Family

## 2016-01-30 VITALS — BP 125/78 | HR 71 | Temp 98.1°F | Resp 18

## 2016-01-30 DIAGNOSIS — C187 Malignant neoplasm of sigmoid colon: Secondary | ICD-10-CM

## 2016-01-30 DIAGNOSIS — C772 Secondary and unspecified malignant neoplasm of intra-abdominal lymph nodes: Principal | ICD-10-CM

## 2016-01-30 DIAGNOSIS — R21 Rash and other nonspecific skin eruption: Secondary | ICD-10-CM

## 2016-01-30 MED ORDER — SODIUM CHLORIDE 0.9% FLUSH
10.0000 mL | INTRAVENOUS | Status: DC | PRN
  Administered 2016-01-30: 10 mL
  Filled 2016-01-30: qty 10

## 2016-01-30 MED ORDER — PREDNISONE 5 MG (21) PO TBPK
5.0000 mg | ORAL_TABLET | Freq: Every day | ORAL | 0 refills | Status: DC
Start: 2016-01-30 — End: 2016-02-02

## 2016-01-30 MED ORDER — HEPARIN SOD (PORK) LOCK FLUSH 100 UNIT/ML IV SOLN
500.0000 [IU] | Freq: Once | INTRAVENOUS | Status: AC | PRN
  Administered 2016-01-30: 500 [IU]
  Filled 2016-01-30: qty 5

## 2016-01-30 NOTE — Progress Notes (Signed)
Patient reports he tolerated first chemo treatment fine other than decreased appetite , recommended some dietary suggestions and pt agreed.

## 2016-01-30 NOTE — Progress Notes (Signed)
Patient presented today for pump DC and had a bright red rash around his neck and under his arms. This is warm to the touch and itchy. He states that after being cleaned with betadine prior to port placement his skin started burning and and then became red. He then noticed redness under his arms after putting on deoderant the next day. We will have him start a steroid dose pack and see if this clears up the rash. He is in agreement with the plan and will notify our office if the rash does not improve or worsens.

## 2016-02-02 ENCOUNTER — Telehealth: Payer: Self-pay | Admitting: *Deleted

## 2016-02-02 ENCOUNTER — Other Ambulatory Visit: Payer: Self-pay | Admitting: Family

## 2016-02-02 ENCOUNTER — Telehealth: Payer: Self-pay | Admitting: Family

## 2016-02-02 DIAGNOSIS — C187 Malignant neoplasm of sigmoid colon: Secondary | ICD-10-CM

## 2016-02-02 DIAGNOSIS — C772 Secondary and unspecified malignant neoplasm of intra-abdominal lymph nodes: Secondary | ICD-10-CM

## 2016-02-02 DIAGNOSIS — R21 Rash and other nonspecific skin eruption: Secondary | ICD-10-CM

## 2016-02-02 MED ORDER — FLUCONAZOLE 200 MG PO TABS: 200.0000 mg | ORAL_TABLET | Freq: Every day | ORAL | 0 refills | Status: DC

## 2016-02-02 NOTE — Telephone Encounter (Signed)
Patient calling because the rash to his right axilla, shoulder, neck isn't getting any better on the Medrol dose pack that was prescribed on Friday. He would like some advice on what to do next.  Spoke to [facility] NP and she will follow up with patient.

## 2016-02-02 NOTE — Telephone Encounter (Signed)
Spoke with patient and he states that his rash of the neck and axilla is unchanged. He is itching a burning and the areas are still quite red (solid rash). He is taking the prednisone dose pack as directed and applying hydrocortisone cream to the effected areas. I spoke with dr. Marin Olp and we will have him try Diflucan for possible candidal outbreak. The patient is in agreement with the plan. Prescription was sent to [facility]. He will contact us if his rash worsens or dose not improve.

## 2016-02-03 ENCOUNTER — Other Ambulatory Visit: Payer: Self-pay | Admitting: *Deleted

## 2016-02-03 ENCOUNTER — Telehealth: Payer: Self-pay | Admitting: *Deleted

## 2016-02-03 MED ORDER — ACETAMINOPHEN-CODEINE #3 300-30 MG PO TABS: 1.0000 | ORAL_TABLET | ORAL | 0 refills | Status: DC | PRN

## 2016-02-03 NOTE — Telephone Encounter (Signed)
Patient called stating his rash is ever so slightly better but still painful, cracked and raw in some areas.  Patient is on last day of steroids and second day of diflucan.  Patient currently putting Gold Bond powder which is soothing it somewhat.  Dr. Marin Olp notified.  Not sure what else we can do for it except confer with Dermatology.  Patient also complaining of constipation.  Dr. Marin Olp states patient is to take Miralax up to 3 times daily for results.  Patient notified of this information.

## 2016-02-03 NOTE — Telephone Encounter (Signed)
Addendum.  Dr. Marin Olp to see patient tomorrow on 02/04/16 and possible referral to Dermatology

## 2016-02-04 ENCOUNTER — Ambulatory Visit (HOSPITAL_BASED_OUTPATIENT_CLINIC_OR_DEPARTMENT_OTHER): Payer: BLUE CROSS/BLUE SHIELD | Admitting: Hematology & Oncology

## 2016-02-04 VITALS — BP 143/90 | HR 101 | Temp 98.4°F | Resp 18 | Wt 235.0 lb

## 2016-02-04 DIAGNOSIS — C187 Malignant neoplasm of sigmoid colon: Secondary | ICD-10-CM

## 2016-02-04 DIAGNOSIS — R21 Rash and other nonspecific skin eruption: Secondary | ICD-10-CM

## 2016-02-04 DIAGNOSIS — L270 Generalized skin eruption due to drugs and medicaments taken internally: Secondary | ICD-10-CM

## 2016-02-04 MED ORDER — SILVER SULFADIAZINE 1 % EX CREA: 1.0000 "application " | TOPICAL_CREAM | Freq: Every day | CUTANEOUS | 3 refills | Status: DC

## 2016-02-04 MED FILL — ACETAMINOPHEN/COD #3 TABLET: 300-30 | 2 days supply | Qty: 15 | Fill #0

## 2016-02-04 MED FILL — SSD 1% CREAM: 1 | 30 days supply | Qty: 400 | Fill #0

## 2016-02-04 NOTE — Progress Notes (Signed)
Hematology and Oncology Follow Up Visit  Layla Evaristo [id number] 07-15-1965 51 y.o. 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 Risenhoover is because he's having problems with skin issues. He started to develop a significant rash in the axillary area bilaterally and also on his neck. This seemed to start right after he had his Port-A-Cath placed. He got his chemotherapy. He tolerated this very well. However, this skin rash worsen. It is very erythematous. He actually has skin breakdown in the left axilla.  He has tried different remedies. Nothing has seemed to help.  He seems to think that this rash is from the skin prep that was used for the Port-A-Cath. I'm not sure if this is the case. I would not think that this is from his chemotherapy although this cannot be discounted.  He's had no fever. He's had no bleeding. He's had no cough. He's had no bowel sores.  Overall, he really has done nicely with the chemotherapy area did  His performance status is ECOG 1.  Medications:  Current Outpatient Prescriptions:  .  acetaminophen (TYLENOL) 500 MG tablet, Take 1,000 mg by mouth every 6 (six) hours as needed (for pain.)., Disp: , Rfl:  .  acetaminophen-codeine (TYLENOL #3) 300-30 MG tablet, Take 1-2 tablets by mouth every 4 (four) hours as needed for moderate pain., Disp: 15 tablet, Rfl: 0 .  dexamethasone (DECADRON) 4 MG tablet, Take 2 tablets (8 mg total) by mouth daily. Start the day after chemotherapy for 2 days. Take with food., Disp: 30 tablet, Rfl: 1 .  fluconazole (DIFLUCAN) 200 MG tablet, Take 1 tablet (200 mg total) by mouth daily., Disp: 7 tablet, Rfl: 0 .  HYDROcodone-acetaminophen (NORCO) 5-325 MG tablet, Take 1 tablet by mouth every 4 (four) hours as needed for moderate pain., Disp: 30 tablet, Rfl: 0 .  lidocaine-prilocaine (EMLA) cream, Apply to affected area  once, Disp: 30 g, Rfl: 3 .  LORazepam (ATIVAN) 0.5 MG tablet, Take 1 tablet (0.5 mg total) by mouth every 6 (six) hours as needed (Nausea or vomiting)., Disp: 30 tablet, Rfl: 0 .  losartan-hydrochlorothiazide (HYZAAR) 100-25 MG tablet, Take 1 tablet by mouth daily., Disp: 90 tablet, Rfl: 3 .  ondansetron (ZOFRAN) 8 MG tablet, Take 1 tablet (8 mg total) by mouth 2 (two) times daily as needed for refractory nausea / vomiting. Start on day 3 after chemotherapy., Disp: 30 tablet, Rfl: 1 .  potassium chloride SA (K-DUR,KLOR-CON) 20 MEQ tablet, Take 1 tablet (20 mEq total) by mouth daily. (Patient taking differently: Take 20 mEq by mouth 3 (three) times daily. 20 meq three times daily for 1 month (starting on 01/14/16), then decrease to 20 meq twice daily indefinitely), Disp: 90 tablet, Rfl: 3 .  prochlorperazine (COMPAZINE) 10 MG tablet, Take 1 tablet (10 mg total) by mouth every 6 (six) hours as needed (Nausea or vomiting)., Disp: 30 tablet, Rfl: 1 .  saccharomyces boulardii (FLORASTOR) 250 MG capsule, Take 1 capsule (250 mg total) by mouth 2 (two) times daily., Disp: 20 capsule, Rfl: 0 .  silver sulfADIAZINE (SILVADENE) 1 % cream, Apply 1 application topically daily. Apply to affected area twice a day, Disp: 400 g, Rfl: 3  Allergies:  Allergies  Allergen Reactions  . Niaspan [Niacin]     Severe flushing  . Percocet [Oxycodone-Acetaminophen] Nausea Only    hallucinations  Past Medical History, Surgical history, Social history, and Family History were reviewed and updated.  Review of Systems: As above  Physical Exam:  weight is 235 lb (106.6 kg). His oral temperature is 98.4 F (36.9 C). His blood pressure is 143/90 (abnormal) and his pulse is 101 (abnormal). His respiration is 18 and oxygen saturation is 98%.   Wt Readings from Last 3 Encounters:  02/04/16 235 lb (106.6 kg)  01/27/16 236 lb (107 kg)  01/20/16 236 lb (107 kg)     Well-developed well-nourished white male. Head and neck  exam shows no ocular or oral lesions. He has no palpable cervical or supraclavicular lymph nodes. Lungs are clear and cardiac exam regular rate and rhythm with no murmurs, rubs or bruits. Abdomen is soft. His laparoscopy scars are well-healed. No rash is noted on his abdominal wall. There is no rash in the inguinal region. He has no fluid wave. There is no palpable liver or spleen tip. Back exam shows no tenderness over the spine, ribs or hips. Extremity shows no clubbing, cyanosis or edema. Skin exam does show the rash on his neck which is healing. The rash in the right axilla is also healing. There is some scaliness. There is no wet dermatitis. The rash in the left axilla has wet dermatitis. It is quite erythematous. There are no blisters. Neurological exam shows no focal neurological deficits.  Lab Results  Component Value Date   WBC 6.9 01/28/2016   HGB 12.3 (L) 01/28/2016   HCT 35.3 (L) 01/28/2016   MCV 87 01/28/2016   PLT 293 01/28/2016     Chemistry      Component Value Date/Time   NA 143 01/28/2016 0853   K 3.2 (L) 01/28/2016 0853   CL 107 01/28/2016 0853   CO2 28 01/28/2016 0853   BUN 9 01/28/2016 0853   CREATININE 1.2 01/28/2016 0853      Component Value Date/Time   CALCIUM 9.1 01/28/2016 0853   ALKPHOS 74 01/28/2016 0853   AST 27 01/28/2016 0853   ALT 44 01/28/2016 0853   BILITOT 0.70 01/28/2016 0853         Impression and Plan: Mr. Fosse is A 51 year old gentleman. 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 He's had one cycle of chemotherapy with FOLFOX.  It is unclear as to what the rash etiology is. Again I would not think it is chemotherapy although this is always a possibility.  I will try some Silvadene cream. Hopefully this might help with the rash. It looks very similar to a rash from radiation. He has not had radiation.  Hopefully, the Silvadene will help. I gave him some Telfa pads to output over the Silvadene on the rash. I gave him some  abdominal pads that he can use to cushion the treated area in the left axilla. We try this and he felt much better.  He was very appreciative of all our efforts to try to get this rash better. This really looks like skin breakdown. Maybe it is from the chemotherapy. Maybe it is from the prep that he had for his Port-A-Cath. The distribution of the rash is peculiar in that it is localized to the upper chest and neck where he would've gotten the prep.  We spent about 40 minutes with him. I really wanted to take some time to figure out how we could help this studies quality of life would improve.  He is due for treatment next week. This will be dictated  based on the improvement on the rash.   [name], MD 1/10/20185:36 PM

## 2016-02-11 ENCOUNTER — Ambulatory Visit: Payer: BLUE CROSS/BLUE SHIELD

## 2016-02-11 ENCOUNTER — Other Ambulatory Visit (HOSPITAL_BASED_OUTPATIENT_CLINIC_OR_DEPARTMENT_OTHER): Payer: BLUE CROSS/BLUE SHIELD

## 2016-02-11 ENCOUNTER — Ambulatory Visit (HOSPITAL_BASED_OUTPATIENT_CLINIC_OR_DEPARTMENT_OTHER): Payer: BLUE CROSS/BLUE SHIELD | Admitting: Hematology & Oncology

## 2016-02-11 ENCOUNTER — Ambulatory Visit (HOSPITAL_BASED_OUTPATIENT_CLINIC_OR_DEPARTMENT_OTHER): Payer: BLUE CROSS/BLUE SHIELD

## 2016-02-11 DIAGNOSIS — C772 Secondary and unspecified malignant neoplasm of intra-abdominal lymph nodes: Secondary | ICD-10-CM | POA: Diagnosis not present

## 2016-02-11 DIAGNOSIS — Z5111 Encounter for antineoplastic chemotherapy: Secondary | ICD-10-CM

## 2016-02-11 DIAGNOSIS — Z85038 Personal history of other malignant neoplasm of large intestine: Secondary | ICD-10-CM | POA: Diagnosis not present

## 2016-02-11 DIAGNOSIS — R21 Rash and other nonspecific skin eruption: Secondary | ICD-10-CM | POA: Diagnosis not present

## 2016-02-11 DIAGNOSIS — C187 Malignant neoplasm of sigmoid colon: Secondary | ICD-10-CM

## 2016-02-11 LAB — CBC WITH DIFFERENTIAL (CANCER CENTER ONLY)
BASO#: 0.1 10*3/uL (ref 0.0–0.2)
BASO%: 0.8 % (ref 0.0–2.0)
EOS ABS: 0.1 10*3/uL (ref 0.0–0.5)
EOS%: 1.6 % (ref 0.0–7.0)
HCT: 36.1 % — ABNORMAL LOW (ref 38.7–49.9)
HGB: 12.5 g/dL — ABNORMAL LOW (ref 13.0–17.1)
LYMPH#: 1.5 10*3/uL (ref 0.9–3.3)
LYMPH%: 24.2 % (ref 14.0–48.0)
MCH: 30 pg (ref 28.0–33.4)
MCHC: 34.6 g/dL (ref 32.0–35.9)
MCV: 87 fL (ref 82–98)
MONO#: 0.5 10*3/uL (ref 0.1–0.9)
MONO%: 7.1 % (ref 0.0–13.0)
NEUT#: 4.2 10*3/uL (ref 1.5–6.5)
NEUT%: 66.3 % (ref 40.0–80.0)
PLATELETS: 283 10*3/uL (ref 145–400)
RBC: 4.16 10*6/uL — ABNORMAL LOW (ref 4.20–5.70)
RDW: 12.6 % (ref 11.1–15.7)
WBC: 6.3 10*3/uL (ref 4.0–10.0)

## 2016-02-11 LAB — CMP (CANCER CENTER ONLY)
ALT(SGPT): 79 U/L — ABNORMAL HIGH (ref 10–47)
AST: 33 U/L (ref 11–38)
Albumin: 3.7 g/dL (ref 3.3–5.5)
Alkaline Phosphatase: 84 U/L (ref 26–84)
BUN: 11 mg/dL (ref 7–22)
CHLORIDE: 104 meq/L (ref 98–108)
CO2: 27 meq/L (ref 18–33)
CREATININE: 0.8 mg/dL (ref 0.6–1.2)
Calcium: 9.4 mg/dL (ref 8.0–10.3)
Glucose, Bld: 144 mg/dL — ABNORMAL HIGH (ref 73–118)
POTASSIUM: 3.4 meq/L (ref 3.3–4.7)
SODIUM: 137 meq/L (ref 128–145)
TOTAL PROTEIN: 7.1 g/dL (ref 6.4–8.1)
Total Bilirubin: 0.8 mg/dl (ref 0.20–1.60)

## 2016-02-11 LAB — LACTATE DEHYDROGENASE: LDH: 214 U/L (ref 125–245)

## 2016-02-11 MED ORDER — SODIUM CHLORIDE 0.9% FLUSH
10.0000 mL | INTRAVENOUS | Status: DC | PRN
  Filled 2016-02-11: qty 10

## 2016-02-11 MED ORDER — PALONOSETRON HCL INJECTION 0.25 MG/5ML
0.2500 mg | Freq: Once | INTRAVENOUS | Status: AC
  Administered 2016-02-11: 0.25 mg via INTRAVENOUS

## 2016-02-11 MED ORDER — SODIUM CHLORIDE 0.9 % IV SOLN
2400.0000 mg/m2 | INTRAVENOUS | Status: DC
  Administered 2016-02-11: 5600 mg via INTRAVENOUS
  Filled 2016-02-11: qty 112

## 2016-02-11 MED ORDER — PALONOSETRON HCL INJECTION 0.25 MG/5ML
INTRAVENOUS | Status: AC
  Filled 2016-02-11: qty 5

## 2016-02-11 MED ORDER — DEXTROSE 5 % IV SOLN
85.0000 mg/m2 | Freq: Once | INTRAVENOUS | Status: AC
  Administered 2016-02-11: 200 mg via INTRAVENOUS
  Filled 2016-02-11: qty 40

## 2016-02-11 MED ORDER — FLUOROURACIL CHEMO INJECTION 2.5 GM/50ML
400.0000 mg/m2 | Freq: Once | INTRAVENOUS | Status: AC
  Administered 2016-02-11: 950 mg via INTRAVENOUS
  Filled 2016-02-11: qty 19

## 2016-02-11 MED ORDER — HEPARIN SOD (PORK) LOCK FLUSH 100 UNIT/ML IV SOLN
500.0000 [IU] | Freq: Once | INTRAVENOUS | Status: DC | PRN
  Filled 2016-02-11: qty 5

## 2016-02-11 MED ORDER — DEXAMETHASONE SODIUM PHOSPHATE 10 MG/ML IJ SOLN
INTRAMUSCULAR | Status: AC
  Filled 2016-02-11: qty 1

## 2016-02-11 MED ORDER — LEUCOVORIN CALCIUM INJECTION 350 MG
400.0000 mg/m2 | Freq: Once | INTRAVENOUS | Status: AC
  Administered 2016-02-11: 936 mg via INTRAVENOUS
  Filled 2016-02-11: qty 46.8

## 2016-02-11 MED ORDER — DEXAMETHASONE SODIUM PHOSPHATE 10 MG/ML IJ SOLN
10.0000 mg | Freq: Once | INTRAMUSCULAR | Status: AC
  Administered 2016-02-11: 10 mg via INTRAVENOUS

## 2016-02-11 MED ORDER — DEXTROSE 5 % IV SOLN
Freq: Once | INTRAVENOUS | Status: AC
  Administered 2016-02-11: 11:00:00 via INTRAVENOUS

## 2016-02-11 NOTE — Progress Notes (Signed)
Hematology and Oncology Follow Up Visit  Vincent Strickland [id number] October 25, 1965 51 y.o. 02/11/2016   Principle Diagnosis:   Stage IIIC [id number]) adenocarcinoma of the upper rectum  Current Therapy:    S/p cycle #1 FOLFOX     Interim History:  Vincent Strickland is back for follow-up. He is doing so much better. The rash is basically resolving. I'm not sure if it was the Silvadene cream that we gave him or just a matter of time. Regardless, he is much more comfortable. He can move his arms now.  I still don't think that this rash was from the chemotherapy as it was so localized. It may been from the chlorhexidine that he had with his surgery and Port-A-Cath.  He is eating well. He's had no problem with diarrhea. There has not been any problems with nausea or vomiting. He's had no fever. He's had no mouth sores.   His performance status is ECOG 1.  Medications:  Current Outpatient Prescriptions:  .  acetaminophen (TYLENOL) 500 MG tablet, Take 1,000 mg by mouth every 6 (six) hours as needed (for pain.)., Disp: , Rfl:  .  dexamethasone (DECADRON) 4 MG tablet, Take 2 tablets (8 mg total) by mouth daily. Start the day after chemotherapy for 2 days. Take with food., Disp: 30 tablet, Rfl: 1 .  lidocaine-prilocaine (EMLA) cream, Apply to affected area once, Disp: 30 g, Rfl: 3 .  LORazepam (ATIVAN) 0.5 MG tablet, Take 1 tablet (0.5 mg total) by mouth every 6 (six) hours as needed (Nausea or vomiting)., Disp: 30 tablet, Rfl: 0 .  losartan-hydrochlorothiazide (HYZAAR) 100-25 MG tablet, Take 1 tablet by mouth daily., Disp: 90 tablet, Rfl: 3 .  ondansetron (ZOFRAN) 8 MG tablet, Take 1 tablet (8 mg total) by mouth 2 (two) times daily as needed for refractory nausea / vomiting. Start on day 3 after chemotherapy., Disp: 30 tablet, Rfl: 1 .  potassium chloride SA (K-DUR,KLOR-CON) 20 MEQ tablet, Take 1 tablet (20 mEq total) by mouth daily. (Patient taking differently: Take 20 mEq by mouth 3 (three) times daily. 20  meq three times daily for 1 month (starting on 01/14/16), then decrease to 20 meq twice daily indefinitely), Disp: 90 tablet, Rfl: 3 .  prochlorperazine (COMPAZINE) 10 MG tablet, Take 1 tablet (10 mg total) by mouth every 6 (six) hours as needed (Nausea or vomiting)., Disp: 30 tablet, Rfl: 1 .  saccharomyces boulardii (FLORASTOR) 250 MG capsule, Take 1 capsule (250 mg total) by mouth 2 (two) times daily., Disp: 20 capsule, Rfl: 0 .  silver sulfADIAZINE (SILVADENE) 1 % cream, Apply 1 application topically daily. Apply to affected area twice a day, Disp: 400 g, Rfl: 3 .  acetaminophen-codeine (TYLENOL #3) 300-30 MG tablet, Take 1-2 tablets by mouth every 4 (four) hours as needed for moderate pain. (Patient not taking: Reported on 02/11/2016), Disp: 15 tablet, Rfl: 0 .  HYDROcodone-acetaminophen (NORCO) 5-325 MG tablet, Take 1 tablet by mouth every 4 (four) hours as needed for moderate pain. (Patient not taking: Reported on 02/11/2016), Disp: 30 tablet, Rfl: 0  Allergies:  Allergies  Allergen Reactions  . Niaspan [Niacin]     Severe flushing  . Percocet [Oxycodone-Acetaminophen] Nausea Only    hallucinations    Past Medical History, Surgical history, Social history, and Family History were reviewed and updated.  Review of Systems: As above  Physical Exam:  vitals were not taken for this visit.  Wt Readings from Last 3 Encounters:  02/04/16 235 lb (106.6 kg)  01/27/16 236  lb (107 kg)  01/20/16 236 lb (107 kg)     Well-developed well-nourished white male. Head and neck exam shows no ocular or oral lesions. He has no palpable cervical or supraclavicular lymph nodes. Lungs are clear and cardiac exam regular rate and rhythm with no murmurs, rubs or bruits. Abdomen is soft. His laparoscopy scars are well-healed. No rash is noted on his abdominal wall. There is no rash in the inguinal region. He has no fluid wave. There is no palpable liver or spleen tip. Back exam shows no tenderness over the  spine, ribs or hips. Extremity shows no clubbing, cyanosis or edema. Skin exam does show the rash To be healing. He has a little bit of hyperpigmentation around the axilla. There are no open wounds. Neurological exam shows no focal neurological deficits.  Lab Results  Component Value Date   WBC 6.3 02/11/2016   HGB 12.5 (L) 02/11/2016   HCT 36.1 (L) 02/11/2016   MCV 87 02/11/2016   PLT 283 02/11/2016     Chemistry      Component Value Date/Time   NA 137 02/11/2016 0947   K 3.4 02/11/2016 0947   CL 104 02/11/2016 0947   CO2 27 02/11/2016 0947   BUN 11 02/11/2016 0947   CREATININE 0.8 02/11/2016 0947      Component Value Date/Time   CALCIUM 9.4 02/11/2016 0947   ALKPHOS 84 02/11/2016 0947   AST 33 02/11/2016 0947   ALT 79 (H) 02/11/2016 0947   BILITOT 0.80 02/11/2016 0947         Impression and Plan: Vincent Strickland is A 51 year old gentleman. 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 He's had one cycle of chemotherapy with FOLFOX.  We will go ahead and give him cycle #2 of chemotherapy. I think he will do okay with this. If he does develop a rash, then we will have to really rethink how we will be overtreat this. I wonder if Xeloda would be a possibility. I would not think that this is from oxaliplatin.  We will plan to get her back in 2 more weeks.  He will definitely let us know about the rash.  [name], MD 1/17/201810:46 AM

## 2016-02-11 NOTE — Patient Instructions (Signed)
[facility] Discharge Instructions for Patients Receiving Chemotherapy  Today you received the following chemotherapy agents Oxaliplatin, 5FU  To help prevent nausea and vomiting after your treatment, we encourage you to take your nausea medication    If you develop nausea and vomiting that is not controlled by your nausea medication, call the clinic.   BELOW ARE SYMPTOMS THAT SHOULD BE REPORTED IMMEDIATELY:  *FEVER GREATER THAN 100.5 F  *CHILLS WITH OR WITHOUT FEVER  NAUSEA AND VOMITING THAT IS NOT CONTROLLED WITH YOUR NAUSEA MEDICATION  *UNUSUAL SHORTNESS OF BREATH  *UNUSUAL BRUISING OR BLEEDING  TENDERNESS IN MOUTH AND THROAT WITH OR WITHOUT PRESENCE OF ULCERS  *URINARY PROBLEMS  *BOWEL PROBLEMS  UNUSUAL RASH Items with * indicate a potential emergency and should be followed up as soon as possible.  Feel free to call the clinic you have any questions or concerns. The clinic phone number is [phone].  Please show the CHEMO ALERT CARD at check-in to the Emergency Department and triage nurse.   

## 2016-02-13 ENCOUNTER — Ambulatory Visit (HOSPITAL_BASED_OUTPATIENT_CLINIC_OR_DEPARTMENT_OTHER): Payer: BLUE CROSS/BLUE SHIELD

## 2016-02-13 VITALS — BP 107/74 | HR 104 | Temp 98.5°F | Resp 18

## 2016-02-13 DIAGNOSIS — C772 Secondary and unspecified malignant neoplasm of intra-abdominal lymph nodes: Principal | ICD-10-CM

## 2016-02-13 DIAGNOSIS — C187 Malignant neoplasm of sigmoid colon: Secondary | ICD-10-CM

## 2016-02-13 DIAGNOSIS — Z452 Encounter for adjustment and management of vascular access device: Secondary | ICD-10-CM

## 2016-02-13 MED ORDER — SODIUM CHLORIDE 0.9% FLUSH
10.0000 mL | INTRAVENOUS | Status: DC | PRN
  Administered 2016-02-13: 10 mL
  Filled 2016-02-13: qty 10

## 2016-02-13 MED ORDER — HEPARIN SOD (PORK) LOCK FLUSH 100 UNIT/ML IV SOLN
500.0000 [IU] | Freq: Once | INTRAVENOUS | Status: AC | PRN
  Administered 2016-02-13: 500 [IU]
  Filled 2016-02-13: qty 5

## 2016-02-25 ENCOUNTER — Ambulatory Visit (HOSPITAL_BASED_OUTPATIENT_CLINIC_OR_DEPARTMENT_OTHER): Payer: BLUE CROSS/BLUE SHIELD

## 2016-02-25 ENCOUNTER — Ambulatory Visit (HOSPITAL_BASED_OUTPATIENT_CLINIC_OR_DEPARTMENT_OTHER): Payer: BLUE CROSS/BLUE SHIELD | Admitting: Hematology & Oncology

## 2016-02-25 ENCOUNTER — Other Ambulatory Visit (HOSPITAL_BASED_OUTPATIENT_CLINIC_OR_DEPARTMENT_OTHER): Payer: BLUE CROSS/BLUE SHIELD

## 2016-02-25 ENCOUNTER — Ambulatory Visit: Payer: BLUE CROSS/BLUE SHIELD

## 2016-02-25 VITALS — Wt 241.0 lb

## 2016-02-25 DIAGNOSIS — C187 Malignant neoplasm of sigmoid colon: Secondary | ICD-10-CM

## 2016-02-25 DIAGNOSIS — Z5111 Encounter for antineoplastic chemotherapy: Secondary | ICD-10-CM | POA: Diagnosis not present

## 2016-02-25 DIAGNOSIS — C772 Secondary and unspecified malignant neoplasm of intra-abdominal lymph nodes: Secondary | ICD-10-CM

## 2016-02-25 DIAGNOSIS — Z85038 Personal history of other malignant neoplasm of large intestine: Secondary | ICD-10-CM

## 2016-02-25 DIAGNOSIS — C20 Malignant neoplasm of rectum: Secondary | ICD-10-CM

## 2016-02-25 LAB — CMP (CANCER CENTER ONLY)
ALBUMIN: 3.7 g/dL (ref 3.3–5.5)
ALT(SGPT): 40 U/L (ref 10–47)
AST: 26 U/L (ref 11–38)
Alkaline Phosphatase: 76 U/L (ref 26–84)
BILIRUBIN TOTAL: 1 mg/dL (ref 0.20–1.60)
BUN: 14 mg/dL (ref 7–22)
CO2: 27 meq/L (ref 18–33)
CREATININE: 0.8 mg/dL (ref 0.6–1.2)
Calcium: 9.2 mg/dL (ref 8.0–10.3)
Chloride: 105 mEq/L (ref 98–108)
GLUCOSE: 123 mg/dL — AB (ref 73–118)
Potassium: 3.2 mEq/L — ABNORMAL LOW (ref 3.3–4.7)
SODIUM: 142 meq/L (ref 128–145)
Total Protein: 6.9 g/dL (ref 6.4–8.1)

## 2016-02-25 LAB — CBC WITH DIFFERENTIAL (CANCER CENTER ONLY)
BASO#: 0.1 10*3/uL (ref 0.0–0.2)
BASO%: 1.2 % (ref 0.0–2.0)
EOS%: 0.5 % (ref 0.0–7.0)
Eosinophils Absolute: 0 10*3/uL (ref 0.0–0.5)
HEMATOCRIT: 35.3 % — AB (ref 38.7–49.9)
HEMOGLOBIN: 12.2 g/dL — AB (ref 13.0–17.1)
LYMPH#: 1.3 10*3/uL (ref 0.9–3.3)
LYMPH%: 33.1 % (ref 14.0–48.0)
MCH: 29.3 pg (ref 28.0–33.4)
MCHC: 34.6 g/dL (ref 32.0–35.9)
MCV: 85 fL (ref 82–98)
MONO#: 0.5 10*3/uL (ref 0.1–0.9)
MONO%: 11.4 % (ref 0.0–13.0)
NEUT#: 2.2 10*3/uL (ref 1.5–6.5)
NEUT%: 53.8 % (ref 40.0–80.0)
PLATELETS: 188 10*3/uL (ref 145–400)
RBC: 4.17 10*6/uL — AB (ref 4.20–5.70)
RDW: 13.7 % (ref 11.1–15.7)
WBC: 4 10*3/uL (ref 4.0–10.0)

## 2016-02-25 LAB — LACTATE DEHYDROGENASE: LDH: 259 U/L — ABNORMAL HIGH (ref 125–245)

## 2016-02-25 MED ORDER — LEUCOVORIN CALCIUM INJECTION 350 MG
400.0000 mg/m2 | Freq: Once | INTRAVENOUS | Status: AC
  Administered 2016-02-25: 936 mg via INTRAVENOUS
  Filled 2016-02-25: qty 46.8

## 2016-02-25 MED ORDER — DEXAMETHASONE SODIUM PHOSPHATE 10 MG/ML IJ SOLN
10.0000 mg | Freq: Once | INTRAMUSCULAR | Status: AC
  Administered 2016-02-25: 10 mg via INTRAVENOUS

## 2016-02-25 MED ORDER — SODIUM CHLORIDE 0.9 % IV SOLN
2400.0000 mg/m2 | INTRAVENOUS | Status: DC
  Administered 2016-02-25: 5600 mg via INTRAVENOUS
  Filled 2016-02-25: qty 112

## 2016-02-25 MED ORDER — OXALIPLATIN CHEMO INJECTION 100 MG/20ML
85.0000 mg/m2 | Freq: Once | INTRAVENOUS | Status: AC
  Administered 2016-02-25: 200 mg via INTRAVENOUS
  Filled 2016-02-25: qty 40

## 2016-02-25 MED ORDER — PALONOSETRON HCL INJECTION 0.25 MG/5ML
0.2500 mg | Freq: Once | INTRAVENOUS | Status: AC
  Administered 2016-02-25: 0.25 mg via INTRAVENOUS

## 2016-02-25 MED ORDER — PALONOSETRON HCL INJECTION 0.25 MG/5ML
INTRAVENOUS | Status: AC
  Filled 2016-02-25: qty 5

## 2016-02-25 MED ORDER — DEXTROSE 5 % IV SOLN
Freq: Once | INTRAVENOUS | Status: AC
  Administered 2016-02-25: 10:00:00 via INTRAVENOUS

## 2016-02-25 MED ORDER — FLUOROURACIL CHEMO INJECTION 2.5 GM/50ML
400.0000 mg/m2 | Freq: Once | INTRAVENOUS | Status: AC
  Administered 2016-02-25: 950 mg via INTRAVENOUS
  Filled 2016-02-25: qty 19

## 2016-02-25 MED ORDER — DEXAMETHASONE SODIUM PHOSPHATE 10 MG/ML IJ SOLN
INTRAMUSCULAR | Status: AC
  Filled 2016-02-25: qty 1

## 2016-02-25 NOTE — Patient Instructions (Signed)
[facility] Discharge Instructions for Patients Receiving Chemotherapy  Today you received the following chemotherapy agents Oxaliplatin, 5FU, Leucovorin  To help prevent nausea and vomiting after your treatment, we encourage you to take your nausea medication -NO ZOFRAN FOR 3 DAYS.   If you develop nausea and vomiting that is not controlled by your nausea medication, call the clinic.   BELOW ARE SYMPTOMS THAT SHOULD BE REPORTED IMMEDIATELY:  *FEVER GREATER THAN 100.5 F  *CHILLS WITH OR WITHOUT FEVER  NAUSEA AND VOMITING THAT IS NOT CONTROLLED WITH YOUR NAUSEA MEDICATION  *UNUSUAL SHORTNESS OF BREATH  *UNUSUAL BRUISING OR BLEEDING  TENDERNESS IN MOUTH AND THROAT WITH OR WITHOUT PRESENCE OF ULCERS  *URINARY PROBLEMS  *BOWEL PROBLEMS  UNUSUAL RASH Items with * indicate a potential emergency and should be followed up as soon as possible.  Feel free to call the clinic you have any questions or concerns. The clinic phone number is [phone].  Please show the Cook at check-in to the Emergency Department and triage nurse.  Leucovorin injection What is this medicine? LEUCOVORIN (loo koe VOR in) is used to prevent or treat the harmful effects of some medicines. This medicine is used to treat anemia caused by a low amount of folic acid in the body. It is also used with 5-fluorouracil (5-FU) to treat colon cancer. This medicine may be used for other purposes; ask your health care provider or pharmacist if you have questions. What should I tell my health care provider before I take this medicine? They need to know if you have any of these conditions: -anemia from low levels of vitamin B-12 in the blood -an unusual or allergic reaction to leucovorin, folic acid, other medicines, foods, dyes, or preservatives -pregnant or trying to get pregnant -breast-feeding How should I use this medicine? This medicine is for injection into a muscle or into a vein. It  is given by a health care professional in a hospital or clinic setting. Talk to your pediatrician regarding the use of this medicine in children. Special care may be needed. Overdosage: If you think you have taken too much of this medicine contact a poison control center or emergency room at once. NOTE: This medicine is only for you. Do not share this medicine with others. What if I miss a dose? This does not apply. What may interact with this medicine? -capecitabine -fluorouracil -phenobarbital -phenytoin -primidone -trimethoprim-sulfamethoxazole This list may not describe all possible interactions. Give your health care provider a list of all the medicines, herbs, non-prescription drugs, or dietary supplements you use. Also tell them if you smoke, drink alcohol, or use illegal drugs. Some items may interact with your medicine. What should I watch for while using this medicine? Your condition will be monitored carefully while you are receiving this medicine. This medicine may increase the side effects of 5-fluorouracil, 5-FU. Tell your doctor or health care professional if you have diarrhea or mouth sores that do not get better or that get worse. What side effects may I notice from receiving this medicine? Side effects that you should report to your doctor or health care professional as soon as possible: -allergic reactions like skin rash, itching or hives, swelling of the face, lips, or tongue -breathing problems -fever, infection -mouth sores -unusual bleeding or bruising -unusually weak or tired Side effects that usually do not require medical attention (report to your doctor or health care professional if they continue or are bothersome): -constipation or diarrhea -loss of  appetite -nausea, vomiting This list may not describe all possible side effects. Call your doctor for medical advice about side effects. You may report side effects to FDA at 1-800-FDA-1088. Where should I keep  my medicine? This drug is given in a hospital or clinic and will not be stored at home. NOTE: This sheet is a summary. It may not cover all possible information. If you have questions about this medicine, talk to your doctor, pharmacist, or health care provider.  2017 Elsevier/Gold Standard (2007-07-18 16:50:29) Fluorouracil, 5-FU injection What is this medicine? FLUOROURACIL, 5-FU (flure oh YOOR a sil) is a chemotherapy drug. It slows the growth of cancer cells. This medicine is used to treat many types of cancer like breast cancer, colon or rectal cancer, pancreatic cancer, and stomach cancer. This medicine may be used for other purposes; ask your health care provider or pharmacist if you have questions. COMMON BRAND NAME(S): Adrucil What should I tell my health care provider before I take this medicine? They need to know if you have any of these conditions: -blood disorders -dihydropyrimidine dehydrogenase (DPD) deficiency -infection (especially a virus infection such as chickenpox, cold sores, or herpes) -kidney disease -liver disease -malnourished, poor nutrition -recent or ongoing radiation therapy -an unusual or allergic reaction to fluorouracil, other chemotherapy, other medicines, foods, dyes, or preservatives -pregnant or trying to get pregnant -breast-feeding How should I use this medicine? This drug is given as an infusion or injection into a vein. It is administered in a hospital or clinic by a specially trained health care professional. Talk to your pediatrician regarding the use of this medicine in children. Special care may be needed. Overdosage: If you think you have taken too much of this medicine contact a poison control center or emergency room at once. NOTE: This medicine is only for you. Do not share this medicine with others. What if I miss a dose? It is important not to miss your dose. Call your doctor or health care professional if you are unable to keep an  appointment. What may interact with this medicine? -allopurinol -cimetidine -dapsone -digoxin -hydroxyurea -leucovorin -levamisole -medicines for seizures like ethotoin, fosphenytoin, phenytoin -medicines to increase blood counts like filgrastim, pegfilgrastim, sargramostim -medicines that treat or prevent blood clots like warfarin, enoxaparin, and dalteparin -methotrexate -metronidazole -pyrimethamine -some other chemotherapy drugs like busulfan, cisplatin, estramustine, vinblastine -trimethoprim -trimetrexate -vaccines Talk to your doctor or health care professional before taking any of these medicines: -acetaminophen -aspirin -ibuprofen -ketoprofen -naproxen This list may not describe all possible interactions. Give your health care provider a list of all the medicines, herbs, non-prescription drugs, or dietary supplements you use. Also tell them if you smoke, drink alcohol, or use illegal drugs. Some items may interact with your medicine. What should I watch for while using this medicine? Visit your doctor for checks on your progress. This drug may make you feel generally unwell. This is not uncommon, as chemotherapy can affect healthy cells as well as cancer cells. Report any side effects. Continue your course of treatment even though you feel ill unless your doctor tells you to stop. In some cases, you may be given additional medicines to help with side effects. Follow all directions for their use. Call your doctor or health care professional for advice if you get a fever, chills or sore throat, or other symptoms of a cold or flu. Do not treat yourself. This drug decreases your body's ability to fight infections. Try to avoid being around people who are sick.  This medicine may increase your risk to bruise or bleed. Call your doctor or health care professional if you notice any unusual bleeding. Be careful brushing and flossing your teeth or using a toothpick because you may get  an infection or bleed more easily. If you have any dental work done, tell your dentist you are receiving this medicine. Avoid taking products that contain aspirin, acetaminophen, ibuprofen, naproxen, or ketoprofen unless instructed by your doctor. These medicines may hide a fever. Do not become pregnant while taking this medicine. Women should inform their doctor if they wish to become pregnant or think they might be pregnant. There is a potential for serious side effects to an unborn child. Talk to your health care professional or pharmacist for more information. Do not breast-feed an infant while taking this medicine. Men should inform their doctor if they wish to father a child. This medicine may lower sperm counts. Do not treat diarrhea with over the counter products. Contact your doctor if you have diarrhea that lasts more than 2 days or if it is severe and watery. This medicine can make you more sensitive to the sun. Keep out of the sun. If you cannot avoid being in the sun, wear protective clothing and use sunscreen. Do not use sun lamps or tanning beds/booths. What side effects may I notice from receiving this medicine? Side effects that you should report to your doctor or health care professional as soon as possible: -allergic reactions like skin rash, itching or hives, swelling of the face, lips, or tongue -low blood counts - this medicine may decrease the number of white blood cells, red blood cells and platelets. You may be at increased risk for infections and bleeding. -signs of infection - fever or chills, cough, sore throat, pain or difficulty passing urine -signs of decreased platelets or bleeding - bruising, pinpoint red spots on the skin, black, tarry stools, blood in the urine -signs of decreased red blood cells - unusually weak or tired, fainting spells, lightheadedness -breathing problems -changes in vision -chest pain -mouth sores -nausea and vomiting -pain, swelling, redness  at site where injected -pain, tingling, numbness in the hands or feet -redness, swelling, or sores on hands or feet -stomach pain -unusual bleeding Side effects that usually do not require medical attention (report to your doctor or health care professional if they continue or are bothersome): -changes in finger or toe nails -diarrhea -dry or itchy skin -hair loss -headache -loss of appetite -sensitivity of eyes to the light -stomach upset -unusually teary eyes This list may not describe all possible side effects. Call your doctor for medical advice about side effects. You may report side effects to FDA at 1-800-FDA-1088. Where should I keep my medicine? This drug is given in a hospital or clinic and will not be stored at home. NOTE: This sheet is a summary. It may not cover all possible information. If you have questions about this medicine, talk to your doctor, pharmacist, or health care provider.  2017 Elsevier/Gold Standard (2007-05-17 13:53:16) Oxaliplatin Injection What is this medicine? OXALIPLATIN (ox AL i PLA tin) is a chemotherapy drug. It targets fast dividing cells, like cancer cells, and causes these cells to die. This medicine is used to treat cancers of the colon and rectum, and many other cancers. This medicine may be used for other purposes; ask your health care provider or pharmacist if you have questions. COMMON BRAND NAME(S): Eloxatin What should I tell my health care provider before I take this  medicine? They need to know if you have any of these conditions: -kidney disease -an unusual or allergic reaction to oxaliplatin, other chemotherapy, other medicines, foods, dyes, or preservatives -pregnant or trying to get pregnant -breast-feeding How should I use this medicine? This drug is given as an infusion into a vein. It is administered in a hospital or clinic by a specially trained health care professional. Talk to your pediatrician regarding the use of this  medicine in children. Special care may be needed. Overdosage: If you think you have taken too much of this medicine contact a poison control center or emergency room at once. NOTE: This medicine is only for you. Do not share this medicine with others. What if I miss a dose? It is important not to miss a dose. Call your doctor or health care professional if you are unable to keep an appointment. What may interact with this medicine? -medicines to increase blood counts like filgrastim, pegfilgrastim, sargramostim -probenecid -some antibiotics like amikacin, gentamicin, neomycin, polymyxin B, streptomycin, tobramycin -zalcitabine Talk to your doctor or health care professional before taking any of these medicines: -acetaminophen -aspirin -ibuprofen -ketoprofen -naproxen This list may not describe all possible interactions. Give your health care provider a list of all the medicines, herbs, non-prescription drugs, or dietary supplements you use. Also tell them if you smoke, drink alcohol, or use illegal drugs. Some items may interact with your medicine. What should I watch for while using this medicine? Your condition will be monitored carefully while you are receiving this medicine. You will need important blood work done while you are taking this medicine. This medicine can make you more sensitive to cold. Do not drink cold drinks or use ice. Cover exposed skin before coming in contact with cold temperatures or cold objects. When out in cold weather wear warm clothing and cover your mouth and nose to warm the air that goes into your lungs. Tell your doctor if you get sensitive to the cold. This drug may make you feel generally unwell. This is not uncommon, as chemotherapy can affect healthy cells as well as cancer cells. Report any side effects. Continue your course of treatment even though you feel ill unless your doctor tells you to stop. In some cases, you may be given additional medicines to  help with side effects. Follow all directions for their use. Call your doctor or health care professional for advice if you get a fever, chills or sore throat, or other symptoms of a cold or flu. Do not treat yourself. This drug decreases your body's ability to fight infections. Try to avoid being around people who are sick. This medicine may increase your risk to bruise or bleed. Call your doctor or health care professional if you notice any unusual bleeding. Be careful brushing and flossing your teeth or using a toothpick because you may get an infection or bleed more easily. If you have any dental work done, tell your dentist you are receiving this medicine. Avoid taking products that contain aspirin, acetaminophen, ibuprofen, naproxen, or ketoprofen unless instructed by your doctor. These medicines may hide a fever. Do not become pregnant while taking this medicine. Women should inform their doctor if they wish to become pregnant or think they might be pregnant. There is a potential for serious side effects to an unborn child. Talk to your health care professional or pharmacist for more information. Do not breast-feed an infant while taking this medicine. Call your doctor or health care professional if you get  diarrhea. Do not treat yourself. What side effects may I notice from receiving this medicine? Side effects that you should report to your doctor or health care professional as soon as possible: -allergic reactions like skin rash, itching or hives, swelling of the face, lips, or tongue -low blood counts - This drug may decrease the number of white blood cells, red blood cells and platelets. You may be at increased risk for infections and bleeding. -signs of infection - fever or chills, cough, sore throat, pain or difficulty passing urine -signs of decreased platelets or bleeding - bruising, pinpoint red spots on the skin, black, tarry stools, nosebleeds -signs of decreased red blood cells -  unusually weak or tired, fainting spells, lightheadedness -breathing problems -chest pain, pressure -cough -diarrhea -jaw tightness -mouth sores -nausea and vomiting -pain, swelling, redness or irritation at the injection site -pain, tingling, numbness in the hands or feet -problems with balance, talking, walking -redness, blistering, peeling or loosening of the skin, including inside the mouth -trouble passing urine or change in the amount of urine Side effects that usually do not require medical attention (report to your doctor or health care professional if they continue or are bothersome): -changes in vision -constipation -hair loss -loss of appetite -metallic taste in the mouth or changes in taste -stomach pain This list may not describe all possible side effects. Call your doctor for medical advice about side effects. You may report side effects to FDA at 1-800-FDA-1088. Where should I keep my medicine? This drug is given in a hospital or clinic and will not be stored at home. NOTE: This sheet is a summary. It may not cover all possible information. If you have questions about this medicine, talk to your doctor, pharmacist, or health care provider.  2017 Elsevier/Gold Standard (2007-08-08 17:22:47)

## 2016-02-25 NOTE — Progress Notes (Signed)
Hematology and Oncology Follow Up Visit  Vincent Strickland:[id number] [birth year] 51 y.o. 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 He is doing so much better. The rash never recurred. As such, I don't think the chemotherapy is anything at all related to him having this rash.  He is working full-time. He really has tolerated chemotherapy well. He has had no diarrhea. He's had no mouth sores. He's had some fatigue. He is gaining some more weight.  He's had no bleeding. He's had no fever. He's had no sinus congestion.  Overall, his performance status is ECOG 1.    Medications:  Current Outpatient Prescriptions:  .  acetaminophen (TYLENOL) 500 MG tablet, Take 1,000 mg by mouth every 6 (six) hours as needed (for pain.)., Disp: , Rfl:  .  acetaminophen-codeine (TYLENOL #3) 300-30 MG tablet, Take 1-2 tablets by mouth every 4 (four) hours as needed for moderate pain. (Patient not taking: Reported on 02/11/2016), Disp: 15 tablet, Rfl: 0 .  dexamethasone (DECADRON) 4 MG tablet, Take 2 tablets (8 mg total) by mouth daily. Start the day after chemotherapy for 2 days. Take with food., Disp: 30 tablet, Rfl: 1 .  HYDROcodone-acetaminophen (NORCO) 5-325 MG tablet, Take 1 tablet by mouth every 4 (four) hours as needed for moderate pain. (Patient not taking: Reported on 02/11/2016), Disp: 30 tablet, Rfl: 0 .  lidocaine-prilocaine (EMLA) cream, Apply to affected area once, Disp: 30 g, Rfl: 3 .  LORazepam (ATIVAN) 0.5 MG tablet, Take 1 tablet (0.5 mg total) by mouth every 6 (six) hours as needed (Nausea or vomiting)., Disp: 30 tablet, Rfl: 0 .  losartan-hydrochlorothiazide (HYZAAR) 100-25 MG tablet, Take 1 tablet by mouth daily., Disp: 90 tablet, Rfl: 3 .  ondansetron (ZOFRAN) 8 MG tablet, Take 1 tablet (8 mg total) by mouth 2 (two) times daily as needed for  refractory nausea / vomiting. Start on day 3 after chemotherapy., Disp: 30 tablet, Rfl: 1 .  potassium chloride SA (K-DUR,KLOR-CON) 20 MEQ tablet, Take 1 tablet (20 mEq total) by mouth daily. (Patient taking differently: Take 20 mEq by mouth 3 (three) times daily. 20 meq three times daily for 1 month (starting on 01/14/16), then decrease to 20 meq twice daily indefinitely), Disp: 90 tablet, Rfl: 3 .  prochlorperazine (COMPAZINE) 10 MG tablet, Take 1 tablet (10 mg total) by mouth every 6 (six) hours as needed (Nausea or vomiting)., Disp: 30 tablet, Rfl: 1 .  saccharomyces boulardii (FLORASTOR) 250 MG capsule, Take 1 capsule (250 mg total) by mouth 2 (two) times daily., Disp: 20 capsule, Rfl: 0 .  silver sulfADIAZINE (SILVADENE) 1 % cream, Apply 1 application topically daily. Apply to affected area twice a day, Disp: 400 g, Rfl: 3 No current facility-administered medications for this visit.   Facility-Administered Medications Ordered in Other Visits:  .  fluorouracil (ADRUCIL) 5,600 mg in sodium chloride 0.9 % 138 mL chemo infusion, 2,400 mg/m2 (Treatment Plan Recorded), Intravenous, 1 day or 1 dose, [name], MD, 5,600 mg at 02/25/16 1352  Allergies:  Allergies  Allergen Reactions  . Niaspan [Niacin]     Severe flushing  . Percocet [Oxycodone-Acetaminophen] Nausea Only    hallucinations    Past Medical History, Surgical history, Social history, and Family History were reviewed and updated.  Review of Systems: As above  Physical Exam:  weight  is 241 lb (109.3 kg).   Wt Readings from Last 3 Encounters:  02/25/16 241 lb (109.3 kg)  02/04/16 235 lb (106.6 kg)  01/27/16 236 lb (107 kg)     Well-developed well-nourished white male. Head and neck exam shows no ocular or oral lesions. He has no palpable cervical or supraclavicular lymph nodes. Lungs are clear and cardiac exam regular rate and rhythm with no murmurs, rubs or bruits. Abdomen is soft. His laparoscopy scars are  well-healed. No rash is noted on his abdominal wall. There is no rash in the inguinal region. He has no fluid wave. There is no palpable liver or spleen tip. Back exam shows no tenderness over the spine, ribs or hips. Extremity shows no clubbing, cyanosis or edema. Skin exam does show the rash to be resolved. He has a little bit of hyperpigmentation around the axilla. There are no open wounds. Neurological exam shows no focal neurological deficits.  Lab Results  Component Value Date   WBC 4.0 02/25/2016   HGB 12.2 (L) 02/25/2016   HCT 35.3 (L) 02/25/2016   MCV 85 02/25/2016   PLT 188 02/25/2016     Chemistry      Component Value Date/Time   NA 142 02/25/2016 0831   K 3.2 (L) 02/25/2016 0831   CL 105 02/25/2016 0831   CO2 27 02/25/2016 0831   BUN 14 02/25/2016 0831   CREATININE 0.8 02/25/2016 0831      Component Value Date/Time   CALCIUM 9.2 02/25/2016 0831   ALKPHOS 76 02/25/2016 0831   AST 26 02/25/2016 0831   ALT 40 02/25/2016 0831   BILITOT 1.00 02/25/2016 0831         Impression and Plan: Vincent Strickland is a 51 year old gentleman. 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 He's had 2 cycles of chemotherapy with FOLFOX.  Thankfully, there was absolutely no rash that developed. In fact, his rash is totally gone now. I have to believe that this rash was related to the surgical scrub that they use for his Port-A-Cath.  I talked to he and his mom at length about the fact that we are going to have to use radiation therapy as part of the adjuvant protocol. I think that given his number of lymph nodes and the fact that this is in the upper rectum, there is an increased risk of local recurrence. I want to prevent this. He is very healthy. I think he can easily tolerate radiation with Xeloda as a radiosensitizer.  I will plan to do 6 cycles of chemotherapy up front. Family will move over to radiochemotherapy. Then I will finish up with 4 cycles of systemic chemotherapy  afterwards.  I know that this is much more aggressive. However, because of his young age, very good health, and 5 positive lymph nodes, I just think that we need to be aggressive with adjuvant therapy to minimize his risk of recurrence.  He understands this very well. He is very agreeable to doing this and we'll do what we feel is best for him.  We will proceed with his third cycle of chemotherapy today area did  I spent about 40 minutes with he and his mom.   [name], MD 1/31/20182:59 PM

## 2016-02-27 ENCOUNTER — Ambulatory Visit (HOSPITAL_BASED_OUTPATIENT_CLINIC_OR_DEPARTMENT_OTHER): Payer: BLUE CROSS/BLUE SHIELD

## 2016-02-27 VITALS — BP 122/87 | HR 78 | Temp 98.4°F | Resp 19

## 2016-02-27 DIAGNOSIS — Z452 Encounter for adjustment and management of vascular access device: Secondary | ICD-10-CM | POA: Diagnosis not present

## 2016-02-27 DIAGNOSIS — C20 Malignant neoplasm of rectum: Secondary | ICD-10-CM | POA: Diagnosis not present

## 2016-02-27 DIAGNOSIS — C187 Malignant neoplasm of sigmoid colon: Secondary | ICD-10-CM

## 2016-02-27 DIAGNOSIS — C772 Secondary and unspecified malignant neoplasm of intra-abdominal lymph nodes: Principal | ICD-10-CM

## 2016-02-27 MED ORDER — SODIUM CHLORIDE 0.9% FLUSH
10.0000 mL | INTRAVENOUS | Status: DC | PRN
  Administered 2016-02-27: 10 mL
  Filled 2016-02-27: qty 10

## 2016-02-27 MED ORDER — HEPARIN SOD (PORK) LOCK FLUSH 100 UNIT/ML IV SOLN
500.0000 [IU] | Freq: Once | INTRAVENOUS | Status: AC | PRN
  Administered 2016-02-27: 500 [IU]
  Filled 2016-02-27: qty 5

## 2016-03-04 ENCOUNTER — Other Ambulatory Visit: Payer: Self-pay | Admitting: Hematology & Oncology

## 2016-03-04 DIAGNOSIS — C187 Malignant neoplasm of sigmoid colon: Secondary | ICD-10-CM

## 2016-03-04 DIAGNOSIS — C772 Secondary and unspecified malignant neoplasm of intra-abdominal lymph nodes: Principal | ICD-10-CM

## 2016-03-10 ENCOUNTER — Ambulatory Visit (HOSPITAL_BASED_OUTPATIENT_CLINIC_OR_DEPARTMENT_OTHER): Payer: BLUE CROSS/BLUE SHIELD | Admitting: Hematology & Oncology

## 2016-03-10 ENCOUNTER — Telehealth: Payer: Self-pay | Admitting: *Deleted

## 2016-03-10 ENCOUNTER — Ambulatory Visit (HOSPITAL_BASED_OUTPATIENT_CLINIC_OR_DEPARTMENT_OTHER): Payer: BLUE CROSS/BLUE SHIELD

## 2016-03-10 ENCOUNTER — Ambulatory Visit: Payer: BLUE CROSS/BLUE SHIELD

## 2016-03-10 ENCOUNTER — Other Ambulatory Visit (HOSPITAL_BASED_OUTPATIENT_CLINIC_OR_DEPARTMENT_OTHER): Payer: BLUE CROSS/BLUE SHIELD

## 2016-03-10 VITALS — BP 129/91 | HR 86 | Temp 98.0°F | Resp 20 | Wt 240.0 lb

## 2016-03-10 DIAGNOSIS — Z5111 Encounter for antineoplastic chemotherapy: Secondary | ICD-10-CM | POA: Diagnosis not present

## 2016-03-10 DIAGNOSIS — R5383 Other fatigue: Secondary | ICD-10-CM | POA: Diagnosis not present

## 2016-03-10 DIAGNOSIS — C772 Secondary and unspecified malignant neoplasm of intra-abdominal lymph nodes: Principal | ICD-10-CM

## 2016-03-10 DIAGNOSIS — C187 Malignant neoplasm of sigmoid colon: Secondary | ICD-10-CM

## 2016-03-10 DIAGNOSIS — C20 Malignant neoplasm of rectum: Secondary | ICD-10-CM

## 2016-03-10 DIAGNOSIS — C779 Secondary and unspecified malignant neoplasm of lymph node, unspecified: Secondary | ICD-10-CM | POA: Diagnosis not present

## 2016-03-10 DIAGNOSIS — E876 Hypokalemia: Secondary | ICD-10-CM | POA: Diagnosis not present

## 2016-03-10 LAB — CMP (CANCER CENTER ONLY)
ALBUMIN: 3.6 g/dL (ref 3.3–5.5)
ALK PHOS: 81 U/L (ref 26–84)
ALT(SGPT): 59 U/L — ABNORMAL HIGH (ref 10–47)
AST: 38 U/L (ref 11–38)
BILIRUBIN TOTAL: 0.7 mg/dL (ref 0.20–1.60)
BUN, Bld: 13 mg/dL (ref 7–22)
CALCIUM: 9.1 mg/dL (ref 8.0–10.3)
CO2: 29 meq/L (ref 18–33)
CREATININE: 0.9 mg/dL (ref 0.6–1.2)
Chloride: 107 mEq/L (ref 98–108)
Glucose, Bld: 127 mg/dL — ABNORMAL HIGH (ref 73–118)
Potassium: 3 mEq/L — CL (ref 3.3–4.7)
SODIUM: 143 meq/L (ref 128–145)
TOTAL PROTEIN: 6.9 g/dL (ref 6.4–8.1)

## 2016-03-10 LAB — CBC WITH DIFFERENTIAL (CANCER CENTER ONLY)
BASO#: 0 10*3/uL (ref 0.0–0.2)
BASO%: 0.9 % (ref 0.0–2.0)
EOS%: 1.1 % (ref 0.0–7.0)
Eosinophils Absolute: 0.1 10*3/uL (ref 0.0–0.5)
HCT: 36.7 % — ABNORMAL LOW (ref 38.7–49.9)
HEMOGLOBIN: 12.6 g/dL — AB (ref 13.0–17.1)
LYMPH#: 1.9 10*3/uL (ref 0.9–3.3)
LYMPH%: 41.7 % (ref 14.0–48.0)
MCH: 28.6 pg (ref 28.0–33.4)
MCHC: 34.3 g/dL (ref 32.0–35.9)
MCV: 83 fL (ref 82–98)
MONO#: 0.5 10*3/uL (ref 0.1–0.9)
MONO%: 11.6 % (ref 0.0–13.0)
NEUT%: 44.7 % (ref 40.0–80.0)
NEUTROS ABS: 2.1 10*3/uL (ref 1.5–6.5)
Platelets: 178 10*3/uL (ref 145–400)
RBC: 4.4 10*6/uL (ref 4.20–5.70)
RDW: 14.2 % (ref 11.1–15.7)
WBC: 4.6 10*3/uL (ref 4.0–10.0)

## 2016-03-10 LAB — CEA (IN HOUSE-CHCC): CEA (CHCC-IN HOUSE): 1.88 ng/mL (ref 0.00–5.00)

## 2016-03-10 LAB — LACTATE DEHYDROGENASE: LDH: 244 U/L (ref 125–245)

## 2016-03-10 MED ORDER — PALONOSETRON HCL INJECTION 0.25 MG/5ML
0.2500 mg | Freq: Once | INTRAVENOUS | Status: AC
  Administered 2016-03-10: 0.25 mg via INTRAVENOUS

## 2016-03-10 MED ORDER — DEXAMETHASONE SODIUM PHOSPHATE 10 MG/ML IJ SOLN
INTRAMUSCULAR | Status: AC
  Filled 2016-03-10: qty 1

## 2016-03-10 MED ORDER — OXALIPLATIN CHEMO INJECTION 100 MG/20ML
85.0000 mg/m2 | Freq: Once | INTRAVENOUS | Status: AC
  Administered 2016-03-10: 200 mg via INTRAVENOUS
  Filled 2016-03-10: qty 40

## 2016-03-10 MED ORDER — DEXAMETHASONE SODIUM PHOSPHATE 10 MG/ML IJ SOLN
10.0000 mg | Freq: Once | INTRAMUSCULAR | Status: AC
  Administered 2016-03-10: 10 mg via INTRAVENOUS

## 2016-03-10 MED ORDER — SODIUM CHLORIDE 0.9 % IV SOLN
2400.0000 mg/m2 | INTRAVENOUS | Status: DC
  Administered 2016-03-10: 5600 mg via INTRAVENOUS
  Filled 2016-03-10: qty 112

## 2016-03-10 MED ORDER — FLUOROURACIL CHEMO INJECTION 2.5 GM/50ML
400.0000 mg/m2 | Freq: Once | INTRAVENOUS | Status: AC
  Administered 2016-03-10: 950 mg via INTRAVENOUS
  Filled 2016-03-10: qty 19

## 2016-03-10 MED ORDER — PALONOSETRON HCL INJECTION 0.25 MG/5ML
INTRAVENOUS | Status: AC
  Filled 2016-03-10: qty 5

## 2016-03-10 MED ORDER — DEXTROSE 5 % IV SOLN
Freq: Once | INTRAVENOUS | Status: AC
  Administered 2016-03-10: 12:00:00 via INTRAVENOUS

## 2016-03-10 MED ORDER — LEUCOVORIN CALCIUM INJECTION 350 MG
400.0000 mg/m2 | Freq: Once | INTRAVENOUS | Status: AC
  Administered 2016-03-10: 936 mg via INTRAVENOUS
  Filled 2016-03-10: qty 46.8

## 2016-03-10 NOTE — Progress Notes (Signed)
Hematology and Oncology Follow Up Visit  Vincent Strickland [id number] 1966-01-09 51 y.o. 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 He is doing well. He has tolerated chemotherapy quite nicely. He does feel somewhat fatigued about 3 or 4 days afterwards. He still trying to work.  He has had no problems with bowels or bladder. He does get constipated with chemotherapy.  He does have hypokalemia. He is on some potassium.  He's had no problems with cough or shortness of breath. He has had no rashes. He's had no leg swelling.   Overall, his performance status is ECOG 1.    Medications:  Current Outpatient Prescriptions:  .  acetaminophen (TYLENOL) 500 MG tablet, Take 1,000 mg by mouth every 6 (six) hours as needed (for pain.)., Disp: , Rfl:  .  dexamethasone (DECADRON) 4 MG tablet, TAKE 2 TABLETS BY MOUTH EVERY DAY **START THE DAY AFTER CHEMOTHERAPY FOR 2 DAYS. TAKE WITH FOOD, Disp: 30 tablet, Rfl: 1 .  lidocaine-prilocaine (EMLA) cream, Apply to affected area once, Disp: 30 g, Rfl: 3 .  LORazepam (ATIVAN) 0.5 MG tablet, Take 1 tablet (0.5 mg total) by mouth every 6 (six) hours as needed (Nausea or vomiting)., Disp: 30 tablet, Rfl: 0 .  losartan-hydrochlorothiazide (HYZAAR) 100-25 MG tablet, Take 1 tablet by mouth daily., Disp: 90 tablet, Rfl: 3 .  ondansetron (ZOFRAN) 8 MG tablet, Take 1 tablet (8 mg total) by mouth 2 (two) times daily as needed for refractory nausea / vomiting. Start on day 3 after chemotherapy., Disp: 30 tablet, Rfl: 1 .  potassium chloride SA (K-DUR,KLOR-CON) 20 MEQ tablet, Take 1 tablet (20 mEq total) by mouth daily. (Patient taking differently: Take 20 mEq by mouth 3 (three) times daily. 20 meq three times daily for 1 month (starting on 01/14/16), then decrease to 20 meq twice daily indefinitely), Disp: 90 tablet, Rfl: 3 .   prochlorperazine (COMPAZINE) 10 MG tablet, Take 1 tablet (10 mg total) by mouth every 6 (six) hours as needed (Nausea or vomiting)., Disp: 30 tablet, Rfl: 1 .  saccharomyces boulardii (FLORASTOR) 250 MG capsule, Take 1 capsule (250 mg total) by mouth 2 (two) times daily., Disp: 20 capsule, Rfl: 0 .  acetaminophen-codeine (TYLENOL #3) 300-30 MG tablet, Take 1-2 tablets by mouth every 4 (four) hours as needed for moderate pain. (Patient not taking: Reported on 02/11/2016), Disp: 15 tablet, Rfl: 0 .  HYDROcodone-acetaminophen (NORCO) 5-325 MG tablet, Take 1 tablet by mouth every 4 (four) hours as needed for moderate pain. (Patient not taking: Reported on 02/11/2016), Disp: 30 tablet, Rfl: 0 .  silver sulfADIAZINE (SILVADENE) 1 % cream, Apply 1 application topically daily. Apply to affected area twice a day (Patient not taking: Reported on 03/10/2016), Disp: 400 g, Rfl: 3  Allergies:  Allergies  Allergen Reactions  . Niaspan [Niacin]     Severe flushing  . Percocet [Oxycodone-Acetaminophen] Nausea Only    hallucinations    Past Medical History, Surgical history, Social history, and Family History were reviewed and updated.  Review of Systems: As above  Physical Exam:  weight is 240 lb (108.9 kg). His oral temperature is 98 F (36.7 C). His blood pressure is 129/91 (abnormal) and his pulse is 86. His respiration is 20.   Wt Readings from Last 3 Encounters:  03/10/16 240 lb (108.9 kg)  02/25/16 241  lb (109.3 kg)  02/04/16 235 lb (106.6 kg)     Well-developed well-nourished white male. Head and neck exam shows no ocular or oral lesions. He has no palpable cervical or supraclavicular lymph nodes. Lungs are clear and cardiac exam regular rate and rhythm with no murmurs, rubs or bruits. Abdomen is soft. His laparoscopy scars are well-healed. No rash is noted on his abdominal wall. There is no rash in the inguinal region. He has no fluid wave. There is no palpable liver or spleen tip. Back exam  shows no tenderness over the spine, ribs or hips. Extremity shows no clubbing, cyanosis or edema. Skin exam does show the rash to be resolved. He has a little bit of hyperpigmentation around the axilla. There are no open wounds. Neurological exam shows no focal neurological deficits.  Lab Results  Component Value Date   WBC 4.6 03/10/2016   HGB 12.6 (L) 03/10/2016   HCT 36.7 (L) 03/10/2016   MCV 83 03/10/2016   PLT 178 03/10/2016     Chemistry      Component Value Date/Time   NA 143 03/10/2016 0951   K 3.0 (LL) 03/10/2016 0951   CL 107 03/10/2016 0951   CO2 29 03/10/2016 0951   BUN 13 03/10/2016 0951   CREATININE 0.9 03/10/2016 0951      Component Value Date/Time   CALCIUM 9.1 03/10/2016 0951   ALKPHOS 81 03/10/2016 0951   AST 38 03/10/2016 0951   ALT 59 (H) 03/10/2016 0951   BILITOT 0.70 03/10/2016 0951         Impression and Plan: Vincent Strickland is a 51 year old gentleman. 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 He's had 3 cycles of chemotherapy with FOLFOX.  For now, we will proceed with his fourth cycle of treatment. He has done so well.  We will go ahead and plan to get him back in 2 weeks for his fifth cycle of treatment.  I still plan for 6 cycles of treatment. We will then get him over to radiation for radiation with Xeloda. Afterwards, I will then plan for 4 cycles of FOLFOX.  I feel that being aggressive in this manner definitely will improve his prognosis. I still worry about the 5 positive lymph nodes. \  I spent about 40 minutes with he and his mom.   [name], MD 2/14/201811:30 AM

## 2016-03-10 NOTE — Telephone Encounter (Signed)
Addendum to previous phone note. Incorrectly documented as Creatinine at 3.0.  Patient potassium is 3.0 and this was what was reported to Dr. Marin Olp.

## 2016-03-10 NOTE — Telephone Encounter (Signed)
Panic creatinine 3.0 called.  Dr. Marin Olp notified.  No orders received

## 2016-03-10 NOTE — Addendum Note (Signed)
Addended by: Burney Gauze R on: 03/10/2016 11:41 AM   Modules accepted: Orders

## 2016-03-10 NOTE — Patient Instructions (Signed)
[facility] Discharge Instructions for Patients Receiving Chemotherapy  Today you received the following chemotherapy agents oxaliplatin, leucovorin and Adrucil.  To help prevent nausea and vomiting after your treatment, we encourage you to take your nausea medication as directed BUT NO ZOFRAN FOR 3 DAYS.    If you develop nausea and vomiting that is not controlled by your nausea medication, call the clinic.   BELOW ARE SYMPTOMS THAT SHOULD BE REPORTED IMMEDIATELY:  *FEVER GREATER THAN 100.5 F  *CHILLS WITH OR WITHOUT FEVER  NAUSEA AND VOMITING THAT IS NOT CONTROLLED WITH YOUR NAUSEA MEDICATION  *UNUSUAL SHORTNESS OF BREATH  *UNUSUAL BRUISING OR BLEEDING  TENDERNESS IN MOUTH AND THROAT WITH OR WITHOUT PRESENCE OF ULCERS  *URINARY PROBLEMS  *BOWEL PROBLEMS  UNUSUAL RASH Items with * indicate a potential emergency and should be followed up as soon as possible.  Feel free to call the clinic you have any questions or concerns. The clinic phone number is [phone].  Please show the CHEMO ALERT CARD at check-in to the Emergency Department and triage nurse.    

## 2016-03-10 NOTE — Telephone Encounter (Signed)
Please disregard 03/10/16 panic level documented of Creatinine of 3.0.  Actual value is Potassium 3.0 which was what was reported to Dr. Marin Olp.

## 2016-03-12 ENCOUNTER — Ambulatory Visit (HOSPITAL_BASED_OUTPATIENT_CLINIC_OR_DEPARTMENT_OTHER): Payer: BLUE CROSS/BLUE SHIELD

## 2016-03-12 VITALS — BP 143/84 | HR 64 | Temp 98.3°F | Resp 18

## 2016-03-12 DIAGNOSIS — C20 Malignant neoplasm of rectum: Secondary | ICD-10-CM

## 2016-03-12 DIAGNOSIS — Z452 Encounter for adjustment and management of vascular access device: Secondary | ICD-10-CM

## 2016-03-12 DIAGNOSIS — C772 Secondary and unspecified malignant neoplasm of intra-abdominal lymph nodes: Principal | ICD-10-CM

## 2016-03-12 DIAGNOSIS — C187 Malignant neoplasm of sigmoid colon: Secondary | ICD-10-CM

## 2016-03-12 MED ORDER — HEPARIN SOD (PORK) LOCK FLUSH 100 UNIT/ML IV SOLN
500.0000 [IU] | Freq: Once | INTRAVENOUS | Status: AC | PRN
  Administered 2016-03-12: 500 [IU]
  Filled 2016-03-12: qty 5

## 2016-03-12 MED ORDER — SODIUM CHLORIDE 0.9% FLUSH
10.0000 mL | INTRAVENOUS | Status: DC | PRN
  Administered 2016-03-12: 10 mL
  Filled 2016-03-12: qty 10

## 2016-03-12 NOTE — Patient Instructions (Signed)

## 2016-03-24 ENCOUNTER — Ambulatory Visit (HOSPITAL_BASED_OUTPATIENT_CLINIC_OR_DEPARTMENT_OTHER): Payer: BLUE CROSS/BLUE SHIELD | Admitting: Family

## 2016-03-24 ENCOUNTER — Ambulatory Visit: Payer: BLUE CROSS/BLUE SHIELD

## 2016-03-24 ENCOUNTER — Telehealth: Payer: Self-pay | Admitting: *Deleted

## 2016-03-24 ENCOUNTER — Other Ambulatory Visit (HOSPITAL_BASED_OUTPATIENT_CLINIC_OR_DEPARTMENT_OTHER): Payer: BLUE CROSS/BLUE SHIELD

## 2016-03-24 ENCOUNTER — Ambulatory Visit (HOSPITAL_BASED_OUTPATIENT_CLINIC_OR_DEPARTMENT_OTHER): Payer: BLUE CROSS/BLUE SHIELD

## 2016-03-24 VITALS — BP 137/90 | HR 80 | Temp 98.0°F | Wt 244.0 lb

## 2016-03-24 DIAGNOSIS — C20 Malignant neoplasm of rectum: Secondary | ICD-10-CM

## 2016-03-24 DIAGNOSIS — C187 Malignant neoplasm of sigmoid colon: Secondary | ICD-10-CM

## 2016-03-24 DIAGNOSIS — E876 Hypokalemia: Secondary | ICD-10-CM | POA: Diagnosis not present

## 2016-03-24 DIAGNOSIS — C772 Secondary and unspecified malignant neoplasm of intra-abdominal lymph nodes: Secondary | ICD-10-CM

## 2016-03-24 DIAGNOSIS — Z85038 Personal history of other malignant neoplasm of large intestine: Secondary | ICD-10-CM | POA: Diagnosis not present

## 2016-03-24 DIAGNOSIS — Z5111 Encounter for antineoplastic chemotherapy: Secondary | ICD-10-CM | POA: Diagnosis not present

## 2016-03-24 LAB — CMP (CANCER CENTER ONLY)
ALBUMIN: 3.5 g/dL (ref 3.3–5.5)
ALK PHOS: 82 U/L (ref 26–84)
ALT: 59 U/L — AB (ref 10–47)
AST: 38 U/L (ref 11–38)
BILIRUBIN TOTAL: 1 mg/dL (ref 0.20–1.60)
BUN, Bld: 12 mg/dL (ref 7–22)
CALCIUM: 9.3 mg/dL (ref 8.0–10.3)
CO2: 28 mEq/L (ref 18–33)
Chloride: 104 mEq/L (ref 98–108)
Creat: 1 mg/dl (ref 0.6–1.2)
Glucose, Bld: 128 mg/dL — ABNORMAL HIGH (ref 73–118)
POTASSIUM: 2.9 meq/L — AB (ref 3.3–4.7)
Sodium: 141 mEq/L (ref 128–145)
Total Protein: 6.6 g/dL (ref 6.4–8.1)

## 2016-03-24 LAB — CBC WITH DIFFERENTIAL (CANCER CENTER ONLY)
BASO#: 0 10*3/uL (ref 0.0–0.2)
BASO%: 1 % (ref 0.0–2.0)
EOS%: 1.3 % (ref 0.0–7.0)
Eosinophils Absolute: 0.1 10*3/uL (ref 0.0–0.5)
HCT: 36.7 % — ABNORMAL LOW (ref 38.7–49.9)
HGB: 12.6 g/dL — ABNORMAL LOW (ref 13.0–17.1)
LYMPH#: 1.6 10*3/uL (ref 0.9–3.3)
LYMPH%: 42 % (ref 14.0–48.0)
MCH: 28.2 pg (ref 28.0–33.4)
MCHC: 34.3 g/dL (ref 32.0–35.9)
MCV: 82 fL (ref 82–98)
MONO#: 0.5 10*3/uL (ref 0.1–0.9)
MONO%: 12.1 % (ref 0.0–13.0)
NEUT#: 1.7 10*3/uL (ref 1.5–6.5)
NEUT%: 43.6 % (ref 40.0–80.0)
PLATELETS: 139 10*3/uL — AB (ref 145–400)
RBC: 4.47 10*6/uL (ref 4.20–5.70)
RDW: 14.5 % (ref 11.1–15.7)
WBC: 3.8 10*3/uL — AB (ref 4.0–10.0)

## 2016-03-24 LAB — CEA (IN HOUSE-CHCC): CEA (CHCC-In House): 1.56 ng/mL (ref 0.00–5.00)

## 2016-03-24 MED ORDER — POTASSIUM CHLORIDE CRYS ER 20 MEQ PO TBCR: 20.0000 meq | EXTENDED_RELEASE_TABLET | Freq: Every day | ORAL | 1 refills | Status: DC

## 2016-03-24 MED ORDER — HEPARIN SOD (PORK) LOCK FLUSH 100 UNIT/ML IV SOLN
500.0000 [IU] | Freq: Once | INTRAVENOUS | Status: DC | PRN
  Filled 2016-03-24: qty 5

## 2016-03-24 MED ORDER — DEXAMETHASONE SODIUM PHOSPHATE 10 MG/ML IJ SOLN
10.0000 mg | Freq: Once | INTRAMUSCULAR | Status: AC
  Administered 2016-03-24: 10 mg via INTRAVENOUS

## 2016-03-24 MED ORDER — SODIUM CHLORIDE 0.9 % IV SOLN
2400.0000 mg/m2 | INTRAVENOUS | Status: DC
  Administered 2016-03-24: 5600 mg via INTRAVENOUS
  Filled 2016-03-24: qty 112

## 2016-03-24 MED ORDER — DEXTROSE 5 % IV SOLN
Freq: Once | INTRAVENOUS | Status: AC
  Administered 2016-03-24: 11:00:00 via INTRAVENOUS

## 2016-03-24 MED ORDER — SODIUM CHLORIDE 0.9% FLUSH
10.0000 mL | INTRAVENOUS | Status: DC | PRN
  Filled 2016-03-24: qty 10

## 2016-03-24 MED ORDER — PALONOSETRON HCL INJECTION 0.25 MG/5ML
0.2500 mg | Freq: Once | INTRAVENOUS | Status: AC
  Administered 2016-03-24: 0.25 mg via INTRAVENOUS

## 2016-03-24 MED ORDER — DEXAMETHASONE SODIUM PHOSPHATE 10 MG/ML IJ SOLN
INTRAMUSCULAR | Status: AC
  Filled 2016-03-24: qty 1

## 2016-03-24 MED ORDER — FLUOROURACIL CHEMO INJECTION 2.5 GM/50ML
400.0000 mg/m2 | Freq: Once | INTRAVENOUS | Status: AC
  Administered 2016-03-24: 950 mg via INTRAVENOUS
  Filled 2016-03-24: qty 19

## 2016-03-24 MED ORDER — LEUCOVORIN CALCIUM INJECTION 350 MG
400.0000 mg/m2 | Freq: Once | INTRAVENOUS | Status: AC
  Administered 2016-03-24: 936 mg via INTRAVENOUS
  Filled 2016-03-24: qty 46.8

## 2016-03-24 MED ORDER — POTASSIUM CHLORIDE CRYS ER 20 MEQ PO TBCR
40.0000 meq | EXTENDED_RELEASE_TABLET | Freq: Two times a day (BID) | ORAL | Status: DC
  Administered 2016-03-24: 40 meq via ORAL
  Filled 2016-03-24: qty 2

## 2016-03-24 MED ORDER — OXALIPLATIN CHEMO INJECTION 100 MG/20ML
85.0000 mg/m2 | Freq: Once | INTRAVENOUS | Status: AC
  Administered 2016-03-24: 200 mg via INTRAVENOUS
  Filled 2016-03-24: qty 40

## 2016-03-24 MED ORDER — PALONOSETRON HCL INJECTION 0.25 MG/5ML
INTRAVENOUS | Status: AC
  Filled 2016-03-24: qty 5

## 2016-03-24 NOTE — Progress Notes (Signed)
Hematology and Oncology Follow Up Visit  Guenther Geiling [id number] 10-22-1965 51 y.o. 03/24/2016   Principle Diagnosis:  Stage IIIC [id number]) adenocarcinoma of the upper rectum  Current Therapy:   S/p cycle #4 FOLFOX    Interim History:  Mr. Sisley is here today with his mother for follow-up and treatment. He is doing well but has had some mild fatigue. After each cycle he has had an occasional bit of nausea (no vomiting) and upset stomach which resolve with drinking some water and zofran as needed.   He has had a dry cough off and on.  No issue with infection. No fever, chills, n/v, cough, rash, dizziness, SOB, chest pain, palpitations, abdominal pain or changes in bowel or bladder habits. He has had some constipation and is taking Miralax twice a day.  No swelling or tenderness in her extremities. For several days after each treatment he has some numbness and tingling in his hands that slowly resolves on its own. He describes this as mild and tolerable.  He has maintained a good appetite and is staying well hydrated. His weight is stable.  He has still been able to work a lighter schedule and is enjoying this. He would like to get back into the gym soon.   Medications:  Allergies as of 03/24/2016      Reactions   Niaspan [niacin]    Severe flushing   Percocet [oxycodone-acetaminophen] Nausea Only   hallucinations      Medication List       Accurate as of 03/24/16  9:50 AM. Always use your most recent med list.          acetaminophen 500 MG tablet Commonly known as:  TYLENOL Take 1,000 mg by mouth every 6 (six) hours as needed (for pain.).   acetaminophen-codeine 300-30 MG tablet Commonly known as:  TYLENOL #3 Take 1-2 tablets by mouth every 4 (four) hours as needed for moderate pain.   dexamethasone 4 MG tablet Commonly known as:  DECADRON TAKE 2 TABLETS BY MOUTH EVERY DAY **START THE DAY AFTER CHEMOTHERAPY FOR 2 DAYS. TAKE WITH FOOD   HYDROcodone-acetaminophen 5-325  MG tablet Commonly known as:  NORCO Take 1 tablet by mouth every 4 (four) hours as needed for moderate pain.   lidocaine-prilocaine cream Commonly known as:  EMLA Apply to affected area once   LORazepam 0.5 MG tablet Commonly known as:  ATIVAN Take 1 tablet (0.5 mg total) by mouth every 6 (six) hours as needed (Nausea or vomiting).   losartan-hydrochlorothiazide 100-25 MG tablet Commonly known as:  HYZAAR Take 1 tablet by mouth daily.   ondansetron 8 MG tablet Commonly known as:  ZOFRAN Take 1 tablet (8 mg total) by mouth 2 (two) times daily as needed for refractory nausea / vomiting. Start on day 3 after chemotherapy.   potassium chloride SA 20 MEQ tablet Commonly known as:  K-DUR,KLOR-CON Take 1 tablet (20 mEq total) by mouth daily.   prochlorperazine 10 MG tablet Commonly known as:  COMPAZINE Take 1 tablet (10 mg total) by mouth every 6 (six) hours as needed (Nausea or vomiting).   saccharomyces boulardii 250 MG capsule Commonly known as:  FLORASTOR Take 1 capsule (250 mg total) by mouth 2 (two) times daily.   silver sulfADIAZINE 1 % cream Commonly known as:  SILVADENE Apply 1 application topically daily. Apply to affected area twice a day       Allergies:  Allergies  Allergen Reactions  . Niaspan [Niacin]     Severe  flushing  . Percocet [Oxycodone-Acetaminophen] Nausea Only    hallucinations    Past Medical History, Surgical history, Social history, and Family History were reviewed and updated.  Review of Systems: All other 10 point review of systems is negative.   Physical Exam:  vitals were not taken for this visit.  Wt Readings from Last 3 Encounters:  03/10/16 240 lb (108.9 kg)  02/25/16 241 lb (109.3 kg)  02/04/16 235 lb (106.6 kg)    Ocular: Sclerae unicteric, pupils equal, round and reactive to light Ear-nose-throat: Oropharynx clear, dentition fair Lymphatic: No cervical supraclavicular or axillary adenopathy Lungs no rales or rhonchi, good  excursion bilaterally Heart regular rate and rhythm, no murmur appreciated Abd soft, nontender, positive bowel sounds, no liver or spleen tip palpated on exam  MSK no focal spinal tenderness, no joint edema Neuro: non-focal, well-oriented, appropriate affect Breasts: Deferred  Lab Results  Component Value Date   WBC 4.6 03/10/2016   HGB 12.6 (L) 03/10/2016   HCT 36.7 (L) 03/10/2016   MCV 83 03/10/2016   PLT 178 03/10/2016   Lab Results  Component Value Date   FERRITIN 34 01/13/2016   IRON 41 (L) 01/13/2016   TIBC 369 01/13/2016   UIBC 328 01/13/2016   IRONPCTSAT 11 (L) 01/13/2016   Lab Results  Component Value Date   RBC 4.40 03/10/2016   No results found for: KPAFRELGTCHN, LAMBDASER, KAPLAMBRATIO No results found for: IGGSERUM, IGA, IGMSERUM No results found for: Odetta Pink, SPEI   Chemistry      Component Value Date/Time   NA 143 03/10/2016 0951   K 3.0 (LL) 03/10/2016 0951   CL 107 03/10/2016 0951   CO2 29 03/10/2016 0951   BUN 13 03/10/2016 0951   CREATININE 0.9 03/10/2016 0951      Component Value Date/Time   CALCIUM 9.1 03/10/2016 0951   ALKPHOS 81 03/10/2016 0951   AST 38 03/10/2016 0951   ALT 59 (H) 03/10/2016 0951   BILITOT 0.70 03/10/2016 0951     Impression and Plan: Mr. Hansford is a pleasant 51 yo gentleman with locally advanced adenocarcinoma of the upper rectum. He as completed 4 cycles of FOLFOX so far and has tolerated this nicely. His counts have remained stable. CEA is pending. Earlier this month his level was down to 1.88.  We will proceed with cycle 5 today as planned. He will complete a total of 6 cycles before going on to radiation in combination with Xeloda. Once this is complete, he will receive another 4 cycles of FOLFOX.  He will received 40 meq of KDUR PO now and again before leaving today for hypokalemia, 2.8. He will continue to take 20 mew PO daily.  He has his current treatment  and appointment schedule.  Both he and his family know to contact our office with any questions or concerns. We can certainly see him sooner if need be.   Eliezer Bottom, NP 2/28/20189:50 AM

## 2016-03-24 NOTE — Telephone Encounter (Signed)
Critical Value potassium 2.9 Dr Marin Olp notified. No orders at this time

## 2016-03-24 NOTE — Patient Instructions (Signed)
Implanted Port Insertion, Care After °This sheet gives you information about how to care for yourself after your procedure. Your health care provider may also give you more specific instructions. If you have problems or questions, contact your health care provider. °What can I expect after the procedure? °After your procedure, it is common to have: °· Discomfort at the port insertion site. °· Bruising on the skin over the port. This should improve over 3-4 days. ° °Follow these instructions at home: °Port care °· After your port is placed, you will get a manufacturer's information card. The card has information about your port. Keep this card with you at all times. °· Take care of the port as told by your health care provider. Ask your health care provider if you or a family member can get training for taking care of the port at home. A home health care nurse may also take care of the port. °· Make sure to remember what type of port you have. °Incision care °· Follow instructions from your health care provider about how to take care of your port insertion site. Make sure you: °? Wash your hands with soap and water before you change your bandage (dressing). If soap and water are not available, use hand sanitizer. °? Change your dressing as told by your health care provider. °? Leave stitches (sutures), skin glue, or adhesive strips in place. These skin closures may need to stay in place for 2 weeks or longer. If adhesive strip edges start to loosen and curl up, you may trim the loose edges. Do not remove adhesive strips completely unless your health care provider tells you to do that. °· Check your port insertion site every day for signs of infection. Check for: °? More redness, swelling, or pain. °? More fluid or blood. °? Warmth. °? Pus or a bad smell. °General instructions °· Do not take baths, swim, or use a hot tub until your health care provider approves. °· Do not lift anything that is heavier than 10 lb (4.5  kg) for a week, or as told by your health care provider. °· Ask your health care provider when it is okay to: °? Return to work or school. °? Resume usual physical activities or sports. °· Do not drive for 24 hours if you were given a medicine to help you relax (sedative). °· Take over-the-counter and prescription medicines only as told by your health care provider. °· Wear a medical alert bracelet in case of an emergency. This will tell any health care providers that you have a port. °· Keep all follow-up visits as told by your health care provider. This is important. °Contact a health care provider if: °· You cannot flush your port with saline as directed, or you cannot draw blood from the port. °· You have a fever or chills. °· You have more redness, swelling, or pain around your port insertion site. °· You have more fluid or blood coming from your port insertion site. °· Your port insertion site feels warm to the touch. °· You have pus or a bad smell coming from the port insertion site. °Get help right away if: °· You have chest pain or shortness of breath. °· You have bleeding from your port that you cannot control. °Summary °· Take care of the port as told by your health care provider. °· Change your dressing as told by your health care provider. °· Keep all follow-up visits as told by your health care provider. °  This information is not intended to replace advice given to you by your health care provider. Make sure you discuss any questions you have with your health care provider. °Document Released: 11/01/2012 Document Revised: 12/03/2015 Document Reviewed: 12/03/2015 °Elsevier Interactive Patient Education © 2017 [employer] Inc[employer] ° °

## 2016-03-24 NOTE — Patient Instructions (Signed)
[facility] Discharge Instructions for Patients Receiving Chemotherapy  Today you received the following chemotherapy agents 5FU, Oxaliplatin  To help prevent nausea and vomiting after your treatment, we encourage you to take your nausea medication    If you develop nausea and vomiting that is not controlled by your nausea medication, call the clinic.   BELOW ARE SYMPTOMS THAT SHOULD BE REPORTED IMMEDIATELY:  *FEVER GREATER THAN 100.5 F  *CHILLS WITH OR WITHOUT FEVER  NAUSEA AND VOMITING THAT IS NOT CONTROLLED WITH YOUR NAUSEA MEDICATION  *UNUSUAL SHORTNESS OF BREATH  *UNUSUAL BRUISING OR BLEEDING  TENDERNESS IN MOUTH AND THROAT WITH OR WITHOUT PRESENCE OF ULCERS  *URINARY PROBLEMS  *BOWEL PROBLEMS  UNUSUAL RASH Items with * indicate a potential emergency and should be followed up as soon as possible.  Feel free to call the clinic you have any questions or concerns. The clinic phone number is [phone].  Please show the CHEMO ALERT CARD at check-in to the Emergency Department and triage nurse.   

## 2016-03-26 ENCOUNTER — Ambulatory Visit (HOSPITAL_BASED_OUTPATIENT_CLINIC_OR_DEPARTMENT_OTHER): Payer: BLUE CROSS/BLUE SHIELD

## 2016-03-26 DIAGNOSIS — C187 Malignant neoplasm of sigmoid colon: Secondary | ICD-10-CM

## 2016-03-26 DIAGNOSIS — C20 Malignant neoplasm of rectum: Secondary | ICD-10-CM | POA: Diagnosis not present

## 2016-03-26 DIAGNOSIS — C772 Secondary and unspecified malignant neoplasm of intra-abdominal lymph nodes: Principal | ICD-10-CM

## 2016-03-26 MED ORDER — SODIUM CHLORIDE 0.9% FLUSH
10.0000 mL | INTRAVENOUS | Status: DC | PRN
  Administered 2016-03-26: 10 mL
  Filled 2016-03-26: qty 10

## 2016-03-26 MED ORDER — HEPARIN SOD (PORK) LOCK FLUSH 100 UNIT/ML IV SOLN
500.0000 [IU] | Freq: Once | INTRAVENOUS | Status: AC | PRN
  Administered 2016-03-26: 500 [IU]
  Filled 2016-03-26: qty 5

## 2016-03-26 NOTE — Patient Instructions (Signed)
Implanted Port Insertion, Care After °This sheet gives you information about how to care for yourself after your procedure. Your health care provider may also give you more specific instructions. If you have problems or questions, contact your health care provider. °What can I expect after the procedure? °After your procedure, it is common to have: °· Discomfort at the port insertion site. °· Bruising on the skin over the port. This should improve over 3-4 days. ° °Follow these instructions at home: °Port care °· After your port is placed, you will get a manufacturer's information card. The card has information about your port. Keep this card with you at all times. °· Take care of the port as told by your health care provider. Ask your health care provider if you or a family member can get training for taking care of the port at home. A home health care nurse may also take care of the port. °· Make sure to remember what type of port you have. °Incision care °· Follow instructions from your health care provider about how to take care of your port insertion site. Make sure you: °? Wash your hands with soap and water before you change your bandage (dressing). If soap and water are not available, use hand sanitizer. °? Change your dressing as told by your health care provider. °? Leave stitches (sutures), skin glue, or adhesive strips in place. These skin closures may need to stay in place for 2 weeks or longer. If adhesive strip edges start to loosen and curl up, you may trim the loose edges. Do not remove adhesive strips completely unless your health care provider tells you to do that. °· Check your port insertion site every day for signs of infection. Check for: °? More redness, swelling, or pain. °? More fluid or blood. °? Warmth. °? Pus or a bad smell. °General instructions °· Do not take baths, swim, or use a hot tub until your health care provider approves. °· Do not lift anything that is heavier than 10 lb (4.5  kg) for a week, or as told by your health care provider. °· Ask your health care provider when it is okay to: °? Return to work or school. °? Resume usual physical activities or sports. °· Do not drive for 24 hours if you were given a medicine to help you relax (sedative). °· Take over-the-counter and prescription medicines only as told by your health care provider. °· Wear a medical alert bracelet in case of an emergency. This will tell any health care providers that you have a port. °· Keep all follow-up visits as told by your health care provider. This is important. °Contact a health care provider if: °· You cannot flush your port with saline as directed, or you cannot draw blood from the port. °· You have a fever or chills. °· You have more redness, swelling, or pain around your port insertion site. °· You have more fluid or blood coming from your port insertion site. °· Your port insertion site feels warm to the touch. °· You have pus or a bad smell coming from the port insertion site. °Get help right away if: °· You have chest pain or shortness of breath. °· You have bleeding from your port that you cannot control. °Summary °· Take care of the port as told by your health care provider. °· Change your dressing as told by your health care provider. °· Keep all follow-up visits as told by your health care provider. °  This information is not intended to replace advice given to you by your health care provider. Make sure you discuss any questions you have with your health care provider. °Document Released: 11/01/2012 Document Revised: 12/03/2015 Document Reviewed: 12/03/2015 °Elsevier Interactive Patient Education © 2017 [employer] Inc[employer] ° °

## 2016-04-07 ENCOUNTER — Ambulatory Visit (HOSPITAL_BASED_OUTPATIENT_CLINIC_OR_DEPARTMENT_OTHER): Payer: BLUE CROSS/BLUE SHIELD | Admitting: Family

## 2016-04-07 ENCOUNTER — Encounter: Payer: Self-pay | Admitting: Family

## 2016-04-07 ENCOUNTER — Other Ambulatory Visit (HOSPITAL_BASED_OUTPATIENT_CLINIC_OR_DEPARTMENT_OTHER): Payer: BLUE CROSS/BLUE SHIELD

## 2016-04-07 ENCOUNTER — Ambulatory Visit: Payer: BLUE CROSS/BLUE SHIELD

## 2016-04-07 ENCOUNTER — Ambulatory Visit (HOSPITAL_BASED_OUTPATIENT_CLINIC_OR_DEPARTMENT_OTHER): Payer: BLUE CROSS/BLUE SHIELD

## 2016-04-07 VITALS — BP 154/93 | HR 77 | Temp 98.5°F | Resp 20 | Wt 247.5 lb

## 2016-04-07 DIAGNOSIS — C20 Malignant neoplasm of rectum: Secondary | ICD-10-CM

## 2016-04-07 DIAGNOSIS — Z5111 Encounter for antineoplastic chemotherapy: Secondary | ICD-10-CM

## 2016-04-07 DIAGNOSIS — C772 Secondary and unspecified malignant neoplasm of intra-abdominal lymph nodes: Secondary | ICD-10-CM

## 2016-04-07 DIAGNOSIS — C187 Malignant neoplasm of sigmoid colon: Secondary | ICD-10-CM

## 2016-04-07 DIAGNOSIS — Z85038 Personal history of other malignant neoplasm of large intestine: Secondary | ICD-10-CM

## 2016-04-07 LAB — CBC WITH DIFFERENTIAL (CANCER CENTER ONLY)
BASO#: 0 10*3/uL (ref 0.0–0.2)
BASO%: 0.7 % (ref 0.0–2.0)
EOS%: 0.7 % (ref 0.0–7.0)
Eosinophils Absolute: 0 10*3/uL (ref 0.0–0.5)
HEMATOCRIT: 37.1 % — AB (ref 38.7–49.9)
HEMOGLOBIN: 12.6 g/dL — AB (ref 13.0–17.1)
LYMPH#: 1.6 10*3/uL (ref 0.9–3.3)
LYMPH%: 35.5 % (ref 14.0–48.0)
MCH: 27.8 pg — ABNORMAL LOW (ref 28.0–33.4)
MCHC: 34 g/dL (ref 32.0–35.9)
MCV: 82 fL (ref 82–98)
MONO#: 0.6 10*3/uL (ref 0.1–0.9)
MONO%: 13.6 % — ABNORMAL HIGH (ref 0.0–13.0)
NEUT%: 49.5 % (ref 40.0–80.0)
NEUTROS ABS: 2.2 10*3/uL (ref 1.5–6.5)
Platelets: 135 10*3/uL — ABNORMAL LOW (ref 145–400)
RBC: 4.54 10*6/uL (ref 4.20–5.70)
RDW: 15 % (ref 11.1–15.7)
WBC: 4.4 10*3/uL (ref 4.0–10.0)

## 2016-04-07 LAB — CMP (CANCER CENTER ONLY)
ALK PHOS: 92 U/L — AB (ref 26–84)
ALT: 56 U/L — AB (ref 10–47)
AST: 42 U/L — AB (ref 11–38)
Albumin: 3.8 g/dL (ref 3.3–5.5)
BILIRUBIN TOTAL: 0.9 mg/dL (ref 0.20–1.60)
BUN, Bld: 11 mg/dL (ref 7–22)
CALCIUM: 9.4 mg/dL (ref 8.0–10.3)
CO2: 27 mEq/L (ref 18–33)
Chloride: 105 mEq/L (ref 98–108)
Creat: 0.8 mg/dl (ref 0.6–1.2)
GLUCOSE: 121 mg/dL — AB (ref 73–118)
POTASSIUM: 3.2 meq/L — AB (ref 3.3–4.7)
Sodium: 143 mEq/L (ref 128–145)
TOTAL PROTEIN: 7 g/dL (ref 6.4–8.1)

## 2016-04-07 LAB — CEA (IN HOUSE-CHCC): CEA (CHCC-In House): 2.03 ng/mL (ref 0.00–5.00)

## 2016-04-07 MED ORDER — SODIUM CHLORIDE 0.9 % IV SOLN
2400.0000 mg/m2 | INTRAVENOUS | Status: DC
  Administered 2016-04-07: 5600 mg via INTRAVENOUS
  Filled 2016-04-07: qty 112

## 2016-04-07 MED ORDER — DEXAMETHASONE SODIUM PHOSPHATE 10 MG/ML IJ SOLN
10.0000 mg | Freq: Once | INTRAMUSCULAR | Status: AC
  Administered 2016-04-07: 10 mg via INTRAVENOUS

## 2016-04-07 MED ORDER — PALONOSETRON HCL INJECTION 0.25 MG/5ML
0.2500 mg | Freq: Once | INTRAVENOUS | Status: AC
  Administered 2016-04-07: 0.25 mg via INTRAVENOUS

## 2016-04-07 MED ORDER — OXALIPLATIN CHEMO INJECTION 100 MG/20ML
85.0000 mg/m2 | Freq: Once | INTRAVENOUS | Status: AC
  Administered 2016-04-07: 200 mg via INTRAVENOUS
  Filled 2016-04-07: qty 40

## 2016-04-07 MED ORDER — FLUOROURACIL CHEMO INJECTION 2.5 GM/50ML
400.0000 mg/m2 | Freq: Once | INTRAVENOUS | Status: AC
  Administered 2016-04-07: 950 mg via INTRAVENOUS
  Filled 2016-04-07: qty 19

## 2016-04-07 MED ORDER — PALONOSETRON HCL INJECTION 0.25 MG/5ML
INTRAVENOUS | Status: AC
  Filled 2016-04-07: qty 5

## 2016-04-07 MED ORDER — DEXAMETHASONE SODIUM PHOSPHATE 10 MG/ML IJ SOLN
INTRAMUSCULAR | Status: AC
  Filled 2016-04-07: qty 1

## 2016-04-07 MED ORDER — DEXTROSE 5 % IV SOLN
400.0000 mg/m2 | Freq: Once | INTRAVENOUS | Status: AC
  Administered 2016-04-07: 936 mg via INTRAVENOUS
  Filled 2016-04-07: qty 46.8

## 2016-04-07 MED ORDER — DEXTROSE 5 % IV SOLN
Freq: Once | INTRAVENOUS | Status: AC
  Administered 2016-04-07: 09:00:00 via INTRAVENOUS

## 2016-04-07 NOTE — Patient Instructions (Signed)
[facility] Discharge Instructions for Patients Receiving Chemotherapy  Today you received the following chemotherapy agents 5FU, Oxaliplatin  To help prevent nausea and vomiting after your treatment, we encourage you to take your nausea medication    If you develop nausea and vomiting that is not controlled by your nausea medication, call the clinic.   BELOW ARE SYMPTOMS THAT SHOULD BE REPORTED IMMEDIATELY:  *FEVER GREATER THAN 100.5 F  *CHILLS WITH OR WITHOUT FEVER  NAUSEA AND VOMITING THAT IS NOT CONTROLLED WITH YOUR NAUSEA MEDICATION  *UNUSUAL SHORTNESS OF BREATH  *UNUSUAL BRUISING OR BLEEDING  TENDERNESS IN MOUTH AND THROAT WITH OR WITHOUT PRESENCE OF ULCERS  *URINARY PROBLEMS  *BOWEL PROBLEMS  UNUSUAL RASH Items with * indicate a potential emergency and should be followed up as soon as possible.  Feel free to call the clinic you have any questions or concerns. The clinic phone number is [phone].  Please show the [place] at check-in to the Emergency Department and triage nurse.

## 2016-04-07 NOTE — Patient Instructions (Signed)
Implanted Port Home Guide An implanted port is a type of central line that is placed under the skin. Central lines are used to provide IV access when treatment or nutrition needs to be given through a person's veins. Implanted ports are used for long-term IV access. An implanted port may be placed because:  You need IV medicine that would be irritating to the small veins in your hands or arms.  You need long-term IV medicines, such as antibiotics.  You need IV nutrition for a long period.  You need frequent blood draws for lab tests.  You need dialysis.  Implanted ports are usually placed in the chest area, but they can also be placed in the upper arm, the abdomen, or the leg. An implanted port has two main parts:  Reservoir. The reservoir is round and will appear as a small, raised area under your skin. The reservoir is the part where a needle is inserted to give medicines or draw blood.  Catheter. The catheter is a thin, flexible tube that extends from the reservoir. The catheter is placed into a large vein. Medicine that is inserted into the reservoir goes into the catheter and then into the vein.  How will I care for my incision site? Do not get the incision site wet. Bathe or shower as directed by your health care provider. How is my port accessed? Special steps must be taken to access the port:  Before the port is accessed, a numbing cream can be placed on the skin. This helps numb the skin over the port site.  Your health care provider uses a sterile technique to access the port. ? Your health care provider must put on a mask and sterile gloves. ? The skin over your port is cleaned carefully with an antiseptic and allowed to dry. ? The port is gently pinched between sterile gloves, and a needle is inserted into the port.  Only "non-coring" port needles should be used to access the port. Once the port is accessed, a blood return should be checked. This helps ensure that the port  is in the vein and is not clogged.  If your port needs to remain accessed for a constant infusion, a clear (transparent) bandage will be placed over the needle site. The bandage and needle will need to be changed every week, or as directed by your health care provider.  Keep the bandage covering the needle clean and dry. Do not get it wet. Follow your health care provider's instructions on how to take a shower or bath while the port is accessed.  If your port does not need to stay accessed, no bandage is needed over the port.  What is flushing? Flushing helps keep the port from getting clogged. Follow your health care provider's instructions on how and when to flush the port. Ports are usually flushed with saline solution or a medicine called heparin. The need for flushing will depend on how the port is used.  If the port is used for intermittent medicines or blood draws, the port will need to be flushed: ? After medicines have been given. ? After blood has been drawn. ? As part of routine maintenance.  If a constant infusion is running, the port may not need to be flushed.  How long will my port stay implanted? The port can stay in for as long as your health care provider thinks it is needed. When it is time for the port to come out, surgery will be   done to remove it. The procedure is similar to the one performed when the port was put in. When should I seek immediate medical care? When you have an implanted port, you should seek immediate medical care if:  You notice a bad smell coming from the incision site.  You have swelling, redness, or drainage at the incision site.  You have more swelling or pain at the port site or the surrounding area.  You have a fever that is not controlled with medicine.  This information is not intended to replace advice given to you by your health care provider. Make sure you discuss any questions you have with your health care provider. Document  Released: 01/11/2005 Document Revised: 06/19/2015 Document Reviewed: 09/18/2012 Elsevier Interactive Patient Education  2017 Elsevier Inc.  

## 2016-04-07 NOTE — Progress Notes (Signed)
Hematology and Oncology Follow Up Visit  Vincent Strickland [id number] August 22, 1965 51 y.o. 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 He is still having fatigue which seems to be lasting a bit longer after each cycle. He is resting as needed and has still been able to work at a reduced schedule.  He had one headache several days after his last treatment that resolved with an ice pack, tylenol and a nap.  He has had no problem with infections. No fever, chills, n/v, cough, rash, dizziness, SOB, chest pain, palpitations, abdominal pain or changes in bowel or bladder habits.  He would like to change to a less expensive probiotic so he will start taking Align  No swelling or tenderness in her extremities. 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 He still describes this as mild and tolerable. No falls or syncopal episodes.  He has maintained a good appetite and is staying well hydrated. His weight is stable.  His goal with some of his coworkers is to walk a 5K in September. This is definitely an obtainable goal and he is quite excited.   Medications:  Allergies as of 04/07/2016      Reactions   Niaspan [niacin]    Severe flushing   Percocet [oxycodone-acetaminophen] Nausea Only   hallucinations      Medication List       Accurate as of 04/07/16  9:34 AM. Always use your most recent med list.          acetaminophen 500 MG tablet Commonly known as:  TYLENOL Take 1,000 mg by mouth every 6 (six) hours as needed (for pain.).   acetaminophen-codeine 300-30 MG tablet Commonly known as:  TYLENOL #3 Take 1-2 tablets by mouth every 4 (four) hours as needed for moderate pain.   dexamethasone 4 MG tablet Commonly known as:  DECADRON TAKE 2 TABLETS BY MOUTH EVERY DAY **START THE DAY AFTER  CHEMOTHERAPY FOR 2 DAYS. TAKE WITH FOOD   HYDROcodone-acetaminophen 5-325 MG tablet Commonly known as:  NORCO Take 1 tablet by mouth every 4 (four) hours as needed for moderate pain.   lidocaine-prilocaine cream Commonly known as:  EMLA Apply to affected area once   LORazepam 0.5 MG tablet Commonly known as:  ATIVAN Take 1 tablet (0.5 mg total) by mouth every 6 (six) hours as needed (Nausea or vomiting).   losartan-hydrochlorothiazide 100-25 MG tablet Commonly known as:  HYZAAR Take 1 tablet by mouth daily.   ondansetron 8 MG tablet Commonly known as:  ZOFRAN Take 1 tablet (8 mg total) by mouth 2 (two) times daily as needed for refractory nausea / vomiting. Start on day 3 after chemotherapy.   potassium chloride SA 20 MEQ tablet Commonly known as:  K-DUR,KLOR-CON Take 1 tablet (20 mEq total) by mouth daily.   prochlorperazine 10 MG tablet Commonly known as:  COMPAZINE Take 1 tablet (10 mg total) by mouth every 6 (six) hours as needed (Nausea or vomiting).   saccharomyces boulardii 250 MG capsule Commonly known as:  FLORASTOR Take 1 capsule (250 mg total) by mouth 2 (two) times daily.   silver sulfADIAZINE 1 % cream Commonly known as:  SILVADENE Apply 1 application topically daily. Apply to affected area twice a day       Allergies:  Allergies  Allergen Reactions  .  Niaspan [Niacin]     Severe flushing  . Percocet [Oxycodone-Acetaminophen] Nausea Only    hallucinations    Past Medical History, Surgical history, Social history, and Family History were reviewed and updated.  Review of Systems: All other 10 point review of systems is negative.   Physical Exam:  weight is 247 lb 8 oz (112.3 kg). His oral temperature is 98.5 F (36.9 C). His blood pressure is 154/93 (abnormal) and his pulse is 77. His respiration is 20.   Wt Readings from Last 3 Encounters:  04/07/16 247 lb 8 oz (112.3 kg)  03/24/16 244 lb (110.7 kg)  03/10/16 240 lb (108.9 kg)    Ocular:  Sclerae unicteric, pupils equal, round and reactive to light Ear-nose-throat: Oropharynx clear, dentition fair Lymphatic: No cervical supraclavicular or axillary adenopathy Lungs no rales or rhonchi, good excursion bilaterally Heart regular rate and rhythm, no murmur appreciated Abd soft, nontender, positive bowel sounds, no liver or spleen tip palpated on exam, no fluid wave MSK no focal spinal tenderness, no joint edema Neuro: non-focal, well-oriented, appropriate affect Breasts: Deferred  Lab Results  Component Value Date   WBC 4.4 04/07/2016   HGB 12.6 (L) 04/07/2016   HCT 37.1 (L) 04/07/2016   MCV 82 04/07/2016   PLT 135 (L) 04/07/2016   Lab Results  Component Value Date   FERRITIN 34 01/13/2016   IRON 41 (L) 01/13/2016   TIBC 369 01/13/2016   UIBC 328 01/13/2016   IRONPCTSAT 11 (L) 01/13/2016   Lab Results  Component Value Date   RBC 4.54 04/07/2016   No results found for: KPAFRELGTCHN, LAMBDASER, KAPLAMBRATIO No results found for: IGGSERUM, IGA, IGMSERUM No results found for: Odetta Pink, SPEI   Chemistry      Component Value Date/Time   NA 143 04/07/2016 0811   K 3.2 (L) 04/07/2016 0811   CL 105 04/07/2016 0811   CO2 27 04/07/2016 0811   BUN 11 04/07/2016 0811   CREATININE 0.8 04/07/2016 0811      Component Value Date/Time   CALCIUM 9.4 04/07/2016 0811   ALKPHOS 92 (H) 04/07/2016 0811   AST 42 (H) 04/07/2016 0811   ALT 56 (H) 04/07/2016 0811   BILITOT 0.90 04/07/2016 0811     Impression and Plan: Vincent Strickland is a pleasant 51 yo gentleman with locally advanced adenocarcinoma of the upper rectum. He as completed 5 cycles of FOLFOX so far and has tolerated this nicely with few side effects that have been tolerable. His counts have remained stable.  In February, his CEA was down to 1.88. Today's level is pending.  We will proceed with cycle 6 today as planned. After this cycle he will meet with rad onc and  set up his schedule for radiation in combination with Xeloda. Once this is complete, he will receive another 4 cycles of FOLFOX.  He has his current treatment and appointment schedule.  Both he and his family know to contact our office with any questions or concerns. We can certainly see him sooner if need be.   Eliezer Bottom, NP 3/14/20189:34 AM

## 2016-04-08 ENCOUNTER — Ambulatory Visit: Payer: BLUE CROSS/BLUE SHIELD | Admitting: Family

## 2016-04-08 ENCOUNTER — Ambulatory Visit: Payer: BLUE CROSS/BLUE SHIELD

## 2016-04-08 ENCOUNTER — Other Ambulatory Visit: Payer: BLUE CROSS/BLUE SHIELD

## 2016-04-09 ENCOUNTER — Ambulatory Visit (HOSPITAL_BASED_OUTPATIENT_CLINIC_OR_DEPARTMENT_OTHER): Payer: BLUE CROSS/BLUE SHIELD

## 2016-04-09 DIAGNOSIS — C20 Malignant neoplasm of rectum: Secondary | ICD-10-CM

## 2016-04-09 DIAGNOSIS — C772 Secondary and unspecified malignant neoplasm of intra-abdominal lymph nodes: Principal | ICD-10-CM

## 2016-04-09 DIAGNOSIS — C187 Malignant neoplasm of sigmoid colon: Secondary | ICD-10-CM

## 2016-04-09 MED ORDER — HEPARIN SOD (PORK) LOCK FLUSH 100 UNIT/ML IV SOLN
500.0000 [IU] | Freq: Once | INTRAVENOUS | Status: AC | PRN
  Administered 2016-04-09: 500 [IU]
  Filled 2016-04-09: qty 5

## 2016-04-09 MED ORDER — SODIUM CHLORIDE 0.9% FLUSH
10.0000 mL | INTRAVENOUS | Status: DC | PRN
  Administered 2016-04-09: 10 mL
  Filled 2016-04-09: qty 10

## 2016-04-09 NOTE — Patient Instructions (Signed)
Implanted Port Insertion, Care After °This sheet gives you information about how to care for yourself after your procedure. Your health care provider may also give you more specific instructions. If you have problems or questions, contact your health care provider. °What can I expect after the procedure? °After your procedure, it is common to have: °· Discomfort at the port insertion site. °· Bruising on the skin over the port. This should improve over 3-4 days. ° °Follow these instructions at home: °Port care °· After your port is placed, you will get a manufacturer's information card. The card has information about your port. Keep this card with you at all times. °· Take care of the port as told by your health care provider. Ask your health care provider if you or a family member can get training for taking care of the port at home. A home health care nurse may also take care of the port. °· Make sure to remember what type of port you have. °Incision care °· Follow instructions from your health care provider about how to take care of your port insertion site. Make sure you: °? Wash your hands with soap and water before you change your bandage (dressing). If soap and water are not available, use hand sanitizer. °? Change your dressing as told by your health care provider. °? Leave stitches (sutures), skin glue, or adhesive strips in place. These skin closures may need to stay in place for 2 weeks or longer. If adhesive strip edges start to loosen and curl up, you may trim the loose edges. Do not remove adhesive strips completely unless your health care provider tells you to do that. °· Check your port insertion site every day for signs of infection. Check for: °? More redness, swelling, or pain. °? More fluid or blood. °? Warmth. °? Pus or a bad smell. °General instructions °· Do not take baths, swim, or use a hot tub until your health care provider approves. °· Do not lift anything that is heavier than 10 lb (4.5  kg) for a week, or as told by your health care provider. °· Ask your health care provider when it is okay to: °? Return to work or school. °? Resume usual physical activities or sports. °· Do not drive for 24 hours if you were given a medicine to help you relax (sedative). °· Take over-the-counter and prescription medicines only as told by your health care provider. °· Wear a medical alert bracelet in case of an emergency. This will tell any health care providers that you have a port. °· Keep all follow-up visits as told by your health care provider. This is important. °Contact a health care provider if: °· You cannot flush your port with saline as directed, or you cannot draw blood from the port. °· You have a fever or chills. °· You have more redness, swelling, or pain around your port insertion site. °· You have more fluid or blood coming from your port insertion site. °· Your port insertion site feels warm to the touch. °· You have pus or a bad smell coming from the port insertion site. °Get help right away if: °· You have chest pain or shortness of breath. °· You have bleeding from your port that you cannot control. °Summary °· Take care of the port as told by your health care provider. °· Change your dressing as told by your health care provider. °· Keep all follow-up visits as told by your health care provider. °  This information is not intended to replace advice given to you by your health care provider. Make sure you discuss any questions you have with your health care provider. °Document Released: 11/01/2012 Document Revised: 12/03/2015 Document Reviewed: 12/03/2015 °Elsevier Interactive Patient Education © 2017 [employer] Inc[employer] ° °

## 2016-04-20 ENCOUNTER — Encounter: Payer: Self-pay | Admitting: Radiation Oncology

## 2016-04-21 ENCOUNTER — Ambulatory Visit: Payer: BLUE CROSS/BLUE SHIELD

## 2016-04-21 ENCOUNTER — Other Ambulatory Visit: Payer: BLUE CROSS/BLUE SHIELD

## 2016-04-21 ENCOUNTER — Other Ambulatory Visit: Payer: Self-pay | Admitting: *Deleted

## 2016-04-21 ENCOUNTER — Ambulatory Visit (HOSPITAL_BASED_OUTPATIENT_CLINIC_OR_DEPARTMENT_OTHER): Payer: BLUE CROSS/BLUE SHIELD | Admitting: Hematology & Oncology

## 2016-04-21 ENCOUNTER — Other Ambulatory Visit (HOSPITAL_BASED_OUTPATIENT_CLINIC_OR_DEPARTMENT_OTHER): Payer: BLUE CROSS/BLUE SHIELD

## 2016-04-21 DIAGNOSIS — Z85038 Personal history of other malignant neoplasm of large intestine: Secondary | ICD-10-CM

## 2016-04-21 DIAGNOSIS — C772 Secondary and unspecified malignant neoplasm of intra-abdominal lymph nodes: Secondary | ICD-10-CM

## 2016-04-21 DIAGNOSIS — C187 Malignant neoplasm of sigmoid colon: Secondary | ICD-10-CM

## 2016-04-21 DIAGNOSIS — C20 Malignant neoplasm of rectum: Secondary | ICD-10-CM

## 2016-04-21 LAB — CMP (CANCER CENTER ONLY)
ALT(SGPT): 48 U/L — ABNORMAL HIGH (ref 10–47)
AST: 42 U/L — AB (ref 11–38)
Albumin: 3.7 g/dL (ref 3.3–5.5)
Alkaline Phosphatase: 97 U/L — ABNORMAL HIGH (ref 26–84)
BUN, Bld: 10 mg/dL (ref 7–22)
CHLORIDE: 106 meq/L (ref 98–108)
CO2: 28 meq/L (ref 18–33)
Calcium: 9.8 mg/dL (ref 8.0–10.3)
Creat: 1 mg/dl (ref 0.6–1.2)
Glucose, Bld: 208 mg/dL — ABNORMAL HIGH (ref 73–118)
POTASSIUM: 3.1 meq/L — AB (ref 3.3–4.7)
SODIUM: 144 meq/L (ref 128–145)
Total Bilirubin: 0.8 mg/dl (ref 0.20–1.60)
Total Protein: 7.4 g/dL (ref 6.4–8.1)

## 2016-04-21 LAB — CBC WITH DIFFERENTIAL (CANCER CENTER ONLY)
BASO#: 0.1 10*3/uL (ref 0.0–0.2)
BASO%: 1 % (ref 0.0–2.0)
EOS%: 1 % (ref 0.0–7.0)
Eosinophils Absolute: 0.1 10*3/uL (ref 0.0–0.5)
HCT: 38.9 % (ref 38.7–49.9)
HEMOGLOBIN: 13 g/dL (ref 13.0–17.1)
LYMPH#: 1.1 10*3/uL (ref 0.9–3.3)
LYMPH%: 23.3 % (ref 14.0–48.0)
MCH: 26.9 pg — ABNORMAL LOW (ref 28.0–33.4)
MCHC: 33.4 g/dL (ref 32.0–35.9)
MCV: 81 fL — ABNORMAL LOW (ref 82–98)
MONO#: 0.6 10*3/uL (ref 0.1–0.9)
MONO%: 12.4 % (ref 0.0–13.0)
NEUT%: 62.3 % (ref 40.0–80.0)
NEUTROS ABS: 3 10*3/uL (ref 1.5–6.5)
Platelets: 140 10*3/uL — ABNORMAL LOW (ref 145–400)
RBC: 4.83 10*6/uL (ref 4.20–5.70)
RDW: 15.5 % (ref 11.1–15.7)
WBC: 4.9 10*3/uL (ref 4.0–10.0)

## 2016-04-21 LAB — CEA (IN HOUSE-CHCC): CEA (CHCC-IN HOUSE): 2.26 ng/mL (ref 0.00–5.00)

## 2016-04-21 MED ORDER — CAPECITABINE 500 MG PO TABS: ORAL_TABLET | ORAL | 0 refills | Status: DC

## 2016-04-21 NOTE — Progress Notes (Signed)
Hematology and Oncology Follow Up Visit  Kayce Betty [id number] Apr 10, 1965 51 y.o. 04/21/2016   Principle Diagnosis:  Stage IIIC ([id number]) adenocarcinoma of the upper rectum  Current Therapy:   FOLFOX s/p cycle #6    Interim History:  Mr. Kattner is here today with his mom for follow-up and treatment.   He finally heard from radiation oncology. They'll see him on April 11.   I talked to Dr. Sondra Come at length about the need for radiation therapy. I thought that he would benefit from radiation therapy by the location of his tumor and also by the 5 lymph nodes. I just worry about the possibility of local recurrence. Dr. Sondra Come agreed with this.  He does not have as much neuropathy. He's had no problems with bowels or bladder. He does have occasional pain in the left lower quadrant.  He's had no cough or shortness of breath. He does have some fatigue.  He is still working. Is trying to work full-time.  He's had no leg swelling. He's had no rashes. He's had no bleeding.  Overall, his performance status is ECOG 0.  Medications:  Allergies as of 04/21/2016      Reactions   Niaspan [niacin]    Severe flushing   Percocet [oxycodone-acetaminophen] Nausea Only   hallucinations      Medication List       Accurate as of 04/21/16  9:41 AM. Always use your most recent med list.          acetaminophen 500 MG tablet Commonly known as:  TYLENOL Take 1,000 mg by mouth every 6 (six) hours as needed (for pain.).   acetaminophen-codeine 300-30 MG tablet Commonly known as:  TYLENOL #3 Take 1-2 tablets by mouth every 4 (four) hours as needed for moderate pain.   dexamethasone 4 MG tablet Commonly known as:  DECADRON TAKE 2 TABLETS BY MOUTH EVERY DAY **START THE DAY AFTER CHEMOTHERAPY FOR 2 DAYS. TAKE WITH FOOD   HYDROcodone-acetaminophen 5-325 MG tablet Commonly known as:  NORCO Take 1 tablet by mouth every 4 (four) hours as needed for moderate pain.   lidocaine-prilocaine  cream Commonly known as:  EMLA Apply to affected area once   LORazepam 0.5 MG tablet Commonly known as:  ATIVAN Take 1 tablet (0.5 mg total) by mouth every 6 (six) hours as needed (Nausea or vomiting).   losartan-hydrochlorothiazide 100-25 MG tablet Commonly known as:  HYZAAR Take 1 tablet by mouth daily.   ondansetron 8 MG tablet Commonly known as:  ZOFRAN Take 1 tablet (8 mg total) by mouth 2 (two) times daily as needed for refractory nausea / vomiting. Start on day 3 after chemotherapy.   potassium chloride SA 20 MEQ tablet Commonly known as:  K-DUR,KLOR-CON Take 1 tablet (20 mEq total) by mouth daily.   prochlorperazine 10 MG tablet Commonly known as:  COMPAZINE Take 1 tablet (10 mg total) by mouth every 6 (six) hours as needed (Nausea or vomiting).   saccharomyces boulardii 250 MG capsule Commonly known as:  FLORASTOR Take 1 capsule (250 mg total) by mouth 2 (two) times daily.   silver sulfADIAZINE 1 % cream Commonly known as:  SILVADENE Apply 1 application topically daily. Apply to affected area twice a day       Allergies:  Allergies  Allergen Reactions  . Niaspan [Niacin]     Severe flushing  . Percocet [Oxycodone-Acetaminophen] Nausea Only    hallucinations    Past Medical History, Surgical history, Social history, and Family History were  reviewed and updated.  Review of Systems: All other 10 point review of systems is negative.   Physical Exam:  vitals were not taken for this visit.  Wt Readings from Last 3 Encounters:  04/07/16 247 lb 8 oz (112.3 kg)  03/24/16 244 lb (110.7 kg)  03/10/16 240 lb (108.9 kg)    Head and neck exam shows no ocular or oral lesions. He has no scleral icterus. There is no conjunctival pallor. There is no adenopathy in the neck. Lungs are clear bilaterally. Cardiac exam regular rate and rhythm with no murmurs, rubs or bruits. Abdomen is soft. His laparoscopy scar is well-healed. He has no fluid wave. There is no guarding or  rebound tenderness. Maybe some slight tenderness in the left lower quadrant. He has no palpable liver or spleen tip. Back exam shows no tenderness over the spine, ribs or hips. Extremities shows no clubbing, cyanosis or edema. He has good range of motion of his joints. Skin exam shows no rashes, ecchymosis or petechia. He does have several hyperpigmented lesions which do not appear to be suspicious. Neurological exam shows no focal neurological deficits.   Lab Results  Component Value Date   WBC 4.9 04/21/2016   HGB 13.0 04/21/2016   HCT 38.9 04/21/2016   MCV 81 (L) 04/21/2016   PLT 140 (L) 04/21/2016   Lab Results  Component Value Date   FERRITIN 34 01/13/2016   IRON 41 (L) 01/13/2016   TIBC 369 01/13/2016   UIBC 328 01/13/2016   IRONPCTSAT 11 (L) 01/13/2016   Lab Results  Component Value Date   RBC 4.83 04/21/2016   No results found for: KPAFRELGTCHN, LAMBDASER, KAPLAMBRATIO No results found for: IGGSERUM, IGA, IGMSERUM No results found for: Odetta Pink, SPEI   Chemistry      Component Value Date/Time   NA 143 04/07/2016 0811   K 3.2 (L) 04/07/2016 0811   CL 105 04/07/2016 0811   CO2 27 04/07/2016 0811   BUN 11 04/07/2016 0811   CREATININE 0.8 04/07/2016 0811      Component Value Date/Time   CALCIUM 9.4 04/07/2016 0811   ALKPHOS 92 (H) 04/07/2016 0811   AST 42 (H) 04/07/2016 0811   ALT 56 (H) 04/07/2016 0811   BILITOT 0.90 04/07/2016 0811     Impression and Plan: Mr. Googe is a pleasant 51 yo gentleman with locally advanced adenocarcinoma of the upper rectum.   Since radiation will not start for least 3 weeks, I will give him another dose of chemotherapy.  I talked to he and his mom for about 40 minutes. As always, they had a lot of questions.  I will use Xeloda for radiation sensitizing agent. His dose of Xeloda will be 2000 mg by mouth twice a day Monday to Friday. I told him to start this when he starts  radiation. I went over the side effects. I told him about the skin toxicity with the son. I told him that he has to wear a hat, long sleeve shirt, sunscreen. He has to stay well-hydrated. I talked him about the PPE with his hands and feet. I told him to use moisturizing cream on his hands and feet. I told he may have some mucositis. He may have some diarrhea. He understands all this.  We will start his seventh cycle of treatment tomorrow.  I'll see him back in 3 weeks.     [name], MD 3/28/20189:41 AM

## 2016-04-22 ENCOUNTER — Telehealth: Payer: Self-pay | Admitting: *Deleted

## 2016-04-22 ENCOUNTER — Other Ambulatory Visit: Payer: Self-pay | Admitting: *Deleted

## 2016-04-22 ENCOUNTER — Ambulatory Visit (HOSPITAL_BASED_OUTPATIENT_CLINIC_OR_DEPARTMENT_OTHER): Payer: BLUE CROSS/BLUE SHIELD

## 2016-04-22 VITALS — BP 125/89 | HR 71 | Temp 98.0°F | Resp 19

## 2016-04-22 DIAGNOSIS — Z5111 Encounter for antineoplastic chemotherapy: Secondary | ICD-10-CM | POA: Diagnosis not present

## 2016-04-22 DIAGNOSIS — C772 Secondary and unspecified malignant neoplasm of intra-abdominal lymph nodes: Secondary | ICD-10-CM

## 2016-04-22 DIAGNOSIS — Z85038 Personal history of other malignant neoplasm of large intestine: Secondary | ICD-10-CM

## 2016-04-22 DIAGNOSIS — C187 Malignant neoplasm of sigmoid colon: Secondary | ICD-10-CM

## 2016-04-22 MED ORDER — PALONOSETRON HCL INJECTION 0.25 MG/5ML
INTRAVENOUS | Status: AC
  Filled 2016-04-22: qty 5

## 2016-04-22 MED ORDER — FLUOROURACIL CHEMO INJECTION 2.5 GM/50ML
400.0000 mg/m2 | Freq: Once | INTRAVENOUS | Status: AC
  Administered 2016-04-22: 950 mg via INTRAVENOUS
  Filled 2016-04-22: qty 19

## 2016-04-22 MED ORDER — CAPECITABINE 500 MG PO TABS: ORAL_TABLET | ORAL | 0 refills | Status: DC

## 2016-04-22 MED ORDER — DEXAMETHASONE SODIUM PHOSPHATE 10 MG/ML IJ SOLN
10.0000 mg | Freq: Once | INTRAMUSCULAR | Status: AC
  Administered 2016-04-22: 10 mg via INTRAVENOUS

## 2016-04-22 MED ORDER — SODIUM CHLORIDE 0.9 % IV SOLN
2400.0000 mg/m2 | INTRAVENOUS | Status: DC
  Administered 2016-04-22: 5600 mg via INTRAVENOUS
  Filled 2016-04-22: qty 50

## 2016-04-22 MED ORDER — PALONOSETRON HCL INJECTION 0.25 MG/5ML
0.2500 mg | Freq: Once | INTRAVENOUS | Status: AC
  Administered 2016-04-22: 0.25 mg via INTRAVENOUS

## 2016-04-22 MED ORDER — DEXTROSE 5 % IV SOLN
Freq: Once | INTRAVENOUS | Status: AC
  Administered 2016-04-22: 10:00:00 via INTRAVENOUS

## 2016-04-22 MED ORDER — OXALIPLATIN CHEMO INJECTION 100 MG/20ML
85.0000 mg/m2 | Freq: Once | INTRAVENOUS | Status: AC
  Administered 2016-04-22: 200 mg via INTRAVENOUS
  Filled 2016-04-22: qty 40

## 2016-04-22 MED ORDER — DEXTROSE 5 % IV SOLN
400.0000 mg/m2 | Freq: Once | INTRAVENOUS | Status: AC
  Administered 2016-04-22: 936 mg via INTRAVENOUS
  Filled 2016-04-22: qty 46.8

## 2016-04-22 MED ORDER — DEXAMETHASONE SODIUM PHOSPHATE 10 MG/ML IJ SOLN
INTRAMUSCULAR | Status: AC
  Filled 2016-04-22: qty 1

## 2016-04-22 NOTE — Telephone Encounter (Addendum)
Patient in office today. Verbally gave results.   ----- Message from [name], MD sent at 04/21/2016  3:22 PM EDT ----- Call - CEA still normal!!!  pete

## 2016-04-22 NOTE — Patient Instructions (Signed)
[facility] Discharge Instructions for Patients Receiving Chemotherapy  Today you received the following chemotherapy agents oxaliplatin, leucovorin and Adrucil.  To help prevent nausea and vomiting after your treatment, we encourage you to take your nausea medication as directed BUT NO ZOFRAN FOR 3 DAYS.    If you develop nausea and vomiting that is not controlled by your nausea medication, call the clinic.   BELOW ARE SYMPTOMS THAT SHOULD BE REPORTED IMMEDIATELY:  *FEVER GREATER THAN 100.5 F  *CHILLS WITH OR WITHOUT FEVER  NAUSEA AND VOMITING THAT IS NOT CONTROLLED WITH YOUR NAUSEA MEDICATION  *UNUSUAL SHORTNESS OF BREATH  *UNUSUAL BRUISING OR BLEEDING  TENDERNESS IN MOUTH AND THROAT WITH OR WITHOUT PRESENCE OF ULCERS  *URINARY PROBLEMS  *BOWEL PROBLEMS  UNUSUAL RASH Items with * indicate a potential emergency and should be followed up as soon as possible.  Feel free to call the clinic you have any questions or concerns. The clinic phone number is [phone].  Please show the [facility] at check-in to the Emergency Department and triage nurse.

## 2016-04-24 ENCOUNTER — Ambulatory Visit: Payer: BLUE CROSS/BLUE SHIELD

## 2016-04-24 VITALS — BP 141/92 | HR 92 | Temp 97.7°F | Resp 20

## 2016-04-24 DIAGNOSIS — C187 Malignant neoplasm of sigmoid colon: Secondary | ICD-10-CM

## 2016-04-24 DIAGNOSIS — C772 Secondary and unspecified malignant neoplasm of intra-abdominal lymph nodes: Principal | ICD-10-CM

## 2016-04-24 MED ORDER — SODIUM CHLORIDE 0.9% FLUSH
10.0000 mL | INTRAVENOUS | Status: DC | PRN
  Administered 2016-04-24: 10 mL
  Filled 2016-04-24: qty 10

## 2016-04-24 MED ORDER — HEPARIN SOD (PORK) LOCK FLUSH 100 UNIT/ML IV SOLN
500.0000 [IU] | Freq: Once | INTRAVENOUS | Status: AC | PRN
  Administered 2016-04-24: 500 [IU]
  Filled 2016-04-24: qty 5

## 2016-04-24 NOTE — Patient Instructions (Signed)
Implanted Port Insertion, Care After °This sheet gives you information about how to care for yourself after your procedure. Your health care provider may also give you more specific instructions. If you have problems or questions, contact your health care provider. °What can I expect after the procedure? °After your procedure, it is common to have: °· Discomfort at the port insertion site. °· Bruising on the skin over the port. This should improve over 3-4 days. ° °Follow these instructions at home: °Port care °· After your port is placed, you will get a manufacturer's information card. The card has information about your port. Keep this card with you at all times. °· Take care of the port as told by your health care provider. Ask your health care provider if you or a family member can get training for taking care of the port at home. A home health care nurse may also take care of the port. °· Make sure to remember what type of port you have. °Incision care °· Follow instructions from your health care provider about how to take care of your port insertion site. Make sure you: °? Wash your hands with soap and water before you change your bandage (dressing). If soap and water are not available, use hand sanitizer. °? Change your dressing as told by your health care provider. °? Leave stitches (sutures), skin glue, or adhesive strips in place. These skin closures may need to stay in place for 2 weeks or longer. If adhesive strip edges start to loosen and curl up, you may trim the loose edges. Do not remove adhesive strips completely unless your health care provider tells you to do that. °· Check your port insertion site every day for signs of infection. Check for: °? More redness, swelling, or pain. °? More fluid or blood. °? Warmth. °? Pus or a bad smell. °General instructions °· Do not take baths, swim, or use a hot tub until your health care provider approves. °· Do not lift anything that is heavier than 10 lb (4.5  kg) for a week, or as told by your health care provider. °· Ask your health care provider when it is okay to: °? Return to work or school. °? Resume usual physical activities or sports. °· Do not drive for 24 hours if you were given a medicine to help you relax (sedative). °· Take over-the-counter and prescription medicines only as told by your health care provider. °· Wear a medical alert bracelet in case of an emergency. This will tell any health care providers that you have a port. °· Keep all follow-up visits as told by your health care provider. This is important. °Contact a health care provider if: °· You cannot flush your port with saline as directed, or you cannot draw blood from the port. °· You have a fever or chills. °· You have more redness, swelling, or pain around your port insertion site. °· You have more fluid or blood coming from your port insertion site. °· Your port insertion site feels warm to the touch. °· You have pus or a bad smell coming from the port insertion site. °Get help right away if: °· You have chest pain or shortness of breath. °· You have bleeding from your port that you cannot control. °Summary °· Take care of the port as told by your health care provider. °· Change your dressing as told by your health care provider. °· Keep all follow-up visits as told by your health care provider. °  This information is not intended to replace advice given to you by your health care provider. Make sure you discuss any questions you have with your health care provider. °Document Released: 11/01/2012 Document Revised: 12/03/2015 Document Reviewed: 12/03/2015 °Elsevier Interactive Patient Education © 2017 [employer] Inc[employer] ° °

## 2016-04-28 ENCOUNTER — Other Ambulatory Visit: Payer: Self-pay | Admitting: Hematology & Oncology

## 2016-04-28 DIAGNOSIS — C187 Malignant neoplasm of sigmoid colon: Secondary | ICD-10-CM

## 2016-04-28 DIAGNOSIS — Z85038 Personal history of other malignant neoplasm of large intestine: Secondary | ICD-10-CM

## 2016-04-28 DIAGNOSIS — C772 Secondary and unspecified malignant neoplasm of intra-abdominal lymph nodes: Secondary | ICD-10-CM

## 2016-04-30 ENCOUNTER — Encounter: Payer: Self-pay | Admitting: Radiation Oncology

## 2016-04-30 NOTE — Progress Notes (Signed)
GI Location of Tumor / Histology: adenocarcinoma of the upper rectum  Ladell Heads presented with rectal bleeding.  Biopsies revealed:   12/30/16 Diagnosis 1. Colon, polyp(s), sigmoid - TUBULAR ADENOMA. NO HIGH GRADE DYSPLASIA OR MALIGNANCY IDENTIFIED. 2. Colon, biopsy, sigmoid mass - ADENOCARCINOMA.  12/31/16 Diagnosis 1. Colon, segmental resection for tumor, recto-sigmoid - INVASIVE WELL-DIFFERENTIATED ADENOCARCINOMA, MEASURING 6.6 CM IN GREATEST DIMENSION. - TUMOR INVADES THROUGH WALL OF COLON, FOCALLY INVOLVING SEROSAL SURFACE. - TUMOR INVOLVES ATTACHED MESENTERIC ADIPOSE TISSUE. - MARGINS ARE NEGATIVE. - FIVE OF NINETEEN LYMPH NODES POSITIVE FOR METASTATIC ADENOCARCINOMA (5/19). - SEE ONCOLOGY TEMPLATE. 2. Colon, resection margin (donut), distal anastomosis - BENIGN COLORECTAL MUCOSA. - NO DYSPLASIA OR TUMOR SEEN.  Past/Anticipated interventions by surgeon, if any: 12/31/16 -  Procedure: LAPAROSCOPIC ASSISTED SIGMOID COLECTOMY;  Surgeon: Johnathan Hausen, MD  Past/Anticipated interventions by medical oncology, if any: FOLFOX s/p cycle #6. Xeloda for radiation sensitizing agent.  Weight changes, if any: Denies.  Bowel/Bladder complaints, if any: Reports taking Miralax twice per day to manage bouts of diarrhea and constipation. Reports pulling with bowel movements but, pain continues to lessen. Denies blood in stool. Reports a hx of hemorrhoids. Denies dysuria.   Nausea / Vomiting, if any: Reports occasional mild nausea. Denies vomiting.  Pain issues, if any:  Occasional headache. Mild neuropathy in finger tips and toes. Pulling sensation with bowel movements  Any blood per rectum:   no  SAFETY ISSUES:  Prior radiation? no  Pacemaker/ICD? no  Possible current pregnancy? no  Is the patient on methotrexate? no  Current Complaints/Details: 51 years old. Married with three children. Accompanied by mother and father.

## 2016-05-05 ENCOUNTER — Encounter: Payer: Self-pay | Admitting: Radiation Oncology

## 2016-05-05 ENCOUNTER — Ambulatory Visit
Admission: RE | Admit: 2016-05-05 | Discharge: 2016-05-05 | Disposition: A | Discharge status: Home / Self Care | Payer: BLUE CROSS/BLUE SHIELD | Source: Ambulatory Visit | Attending: Radiation Oncology | Admitting: Radiation Oncology

## 2016-05-05 DIAGNOSIS — Z9889 Other specified postprocedural states: Secondary | ICD-10-CM | POA: Diagnosis not present

## 2016-05-05 DIAGNOSIS — C772 Secondary and unspecified malignant neoplasm of intra-abdominal lymph nodes: Secondary | ICD-10-CM | POA: Insufficient documentation

## 2016-05-05 DIAGNOSIS — I10 Essential (primary) hypertension: Secondary | ICD-10-CM | POA: Diagnosis not present

## 2016-05-05 DIAGNOSIS — Z833 Family history of diabetes mellitus: Secondary | ICD-10-CM | POA: Diagnosis not present

## 2016-05-05 DIAGNOSIS — Z79899 Other long term (current) drug therapy: Secondary | ICD-10-CM | POA: Insufficient documentation

## 2016-05-05 DIAGNOSIS — Z510 Encounter for antineoplastic radiation therapy: Secondary | ICD-10-CM | POA: Insufficient documentation

## 2016-05-05 DIAGNOSIS — Z825 Family history of asthma and other chronic lower respiratory diseases: Secondary | ICD-10-CM | POA: Diagnosis not present

## 2016-05-05 DIAGNOSIS — Z888 Allergy status to other drugs, medicaments and biological substances status: Secondary | ICD-10-CM | POA: Insufficient documentation

## 2016-05-05 DIAGNOSIS — C187 Malignant neoplasm of sigmoid colon: Secondary | ICD-10-CM

## 2016-05-05 DIAGNOSIS — Z87442 Personal history of urinary calculi: Secondary | ICD-10-CM | POA: Insufficient documentation

## 2016-05-05 DIAGNOSIS — Z809 Family history of malignant neoplasm, unspecified: Secondary | ICD-10-CM | POA: Insufficient documentation

## 2016-05-05 DIAGNOSIS — Z8249 Family history of ischemic heart disease and other diseases of the circulatory system: Secondary | ICD-10-CM | POA: Diagnosis not present

## 2016-05-05 NOTE — Progress Notes (Signed)
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 male who presented to the ED on 12/29/16 for bright red rectal bleeding. A colonoscopy was performed on 12/30/16 revealing moderately differentiated adenocarcinoma. Laparoscopic assisted low anterior resection of rectosigmoid with stapled anastomosis, mobilization of splenic flexure and rigid endoscopy on 12/31/16 showed invasive well-differentiated adenocarcinoma, all margins negative. The mass was noted to be much lower than anticipated and was located towards the distal sigmoid at the rectosigmoid junction. The tumor measured 6.6 cm and 5/19 lymph nodes were positive. There was no lymphovascular or perineural invasion. The tumor did invade into the mesenteric adipose tissue(T4a). Patient was started on Folfox on 01/28/16, and he has currently completed his 7th cycle. He was last seen by Dr. Marin Olp on 04/21/16 and was advised to begin 2000 mg of Xeloda twice a day once starting radiation.  Patient notes taking Miralax twice a day to manage bouts of diarrhea and constipation. He reports a pulling sensation with bowel movements, but reports less pain. He reports a history of hemorrhoids. He notes occasional mild nausea, occasional headaches (history of migraines), as well as mild neuropathy in the finger tips and toes. Patient denies weight changes, vomiting, dysuria, or blood in stool.  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    PAST SURGICAL HISTORY: Past Surgical History:  Procedure Laterality Date  . COLONOSCOPY WITH PROPOFOL Left 12/31/2015   Procedure: COLONOSCOPY WITH PROPOFOL;  Surgeon: Arta Silence, MD;  Location: [facility];  Service: Endoscopy;  Laterality: Left;  . LAPAROSCOPIC SIGMOID COLECTOMY N/A 01/01/2016   Procedure: LAPAROSCOPIC ASSISTED SIGMOID COLECTOMY;  Surgeon: Johnathan Hausen, MD;  Location: [facility];  Service: General;  Laterality: N/A;  . LITHOTRIPSY    . PORTACATH PLACEMENT N/A 01/27/2016   Procedure: INSERTION PORT-A-CATH;  Surgeon: Johnathan Hausen, MD;  Location: [facility];  Service: General;  Laterality: N/A;  . TONSILLECTOMY      FAMILY HISTORY: family history includes COPD in his paternal grandfather; Cancer in his father, maternal grandfather, maternal grandmother, and mother; Diabetes in his paternal grandfather; Hypertension in his maternal grandmother and mother; Migraines in his father.  SOCIAL HISTORY:  reports that he has never smoked. He has never used smokeless tobacco. He reports that he does not drink alcohol or use drugs.  ALLERGIES: Niaspan [niacin] and Percocet [oxycodone-acetaminophen]  MEDICATIONS:  Current Outpatient Prescriptions  Medication Sig Dispense Refill  . losartan-hydrochlorothiazide (HYZAAR) 100-25 MG tablet Take 1 tablet by mouth daily. 90 tablet 3  . potassium chloride SA (K-DUR,KLOR-CON) 20 MEQ tablet Take 1 tablet (20 mEq total) by mouth daily. 30 tablet 1  . saccharomyces boulardii (FLORASTOR) 250 MG capsule Take 1 capsule (250 mg total) by mouth 2 (two) times daily. 20 capsule 0  . acetaminophen (TYLENOL) 500 MG tablet Take 1,000 mg by mouth every 6 (six) hours as needed (for pain.).    Marland Kitchen acetaminophen-codeine (  TYLENOL #3) 300-30 MG tablet Take 1-2 tablets by mouth every 4  (four) hours as needed for moderate pain. (Patient not taking: Reported on 05/05/2016) 15 tablet 0  . capecitabine (XELODA) 500 MG tablet TAKE 4 TABLETS BY MOUTH TWICE DAILY WITH FOOD MONDAY THROUGH FRIDAY (Patient not taking: Reported on 05/05/2016) 200 tablet 0  . dexamethasone (DECADRON) 4 MG tablet TAKE 2 TABLETS BY MOUTH EVERY DAY **START THE DAY AFTER CHEMOTHERAPY FOR 2 DAYS. TAKE WITH FOOD (Patient not taking: Reported on 04/21/2016) 30 tablet 1  . HYDROcodone-acetaminophen (NORCO) 5-325 MG tablet Take 1 tablet by mouth every 4 (four) hours as needed for moderate pain. (Patient not taking: Reported on 05/05/2016) 30 tablet 0  . lidocaine-prilocaine (EMLA) cream Apply to affected area once (Patient not taking: Reported on 05/05/2016) 30 g 3  . LORazepam (ATIVAN) 0.5 MG tablet Take 1 tablet (0.5 mg total) by mouth every 6 (six) hours as needed (Nausea or vomiting). (Patient not taking: Reported on 05/05/2016) 30 tablet 0  . ondansetron (ZOFRAN) 8 MG tablet Take 1 tablet (8 mg total) by mouth 2 (two) times daily as needed for refractory nausea / vomiting. Start on day 3 after chemotherapy. (Patient not taking: Reported on 04/21/2016) 30 tablet 1  . prochlorperazine (COMPAZINE) 10 MG tablet Take 1 tablet (10 mg total) by mouth every 6 (six) hours as needed (Nausea or vomiting). (Patient not taking: Reported on 04/21/2016) 30 tablet 1  . silver sulfADIAZINE (SILVADENE) 1 % cream Apply 1 application topically daily. Apply to affected area twice a day (Patient not taking: Reported on 05/05/2016) 400 g 3   No current facility-administered medications for this encounter.     REVIEW OF SYSTEMS:  A 10 point review of systems is documented in the electronic medical record. This was obtained by the nursing staff. However, I reviewed this with the patient to discuss relevant findings and make appropriate changes.    PHYSICAL EXAM:  height is 6\' 1"  (1.854 m) and weight is 252 lb 3.2 oz (114.4 kg). His blood pressure  is 141/109 (abnormal) and his pulse is 90. His respiration is 18 and oxygen saturation is 100%.   General: Alert and oriented, in no acute distress HEENT: Head is normocephalic. Extraocular movements are intact. Oropharynx is clear. Neck: Neck is supple, no palpable cervical or supraclavicular lymphadenopathy. Heart: Regular in rate and rhythm with no murmurs, rubs, or gallops. Chest: Clear to auscultation bilaterally, with no rhonchi, wheezes, or rales. Abdomen: Soft, nontender, nondistended, with no rigidity or guarding. Approximately a 10 cm scar noted in the lower abdominal area that was well healed. Several laparoscopic scars were noted as well. Extremities: No cyanosis or edema. Lymphatics: see Neck Exam, no inguinal adenopathy Skin: No concerning lesions. Musculoskeletal: symmetric strength and muscle tone throughout. Neurologic: Cranial nerves II through XII are grossly intact. No obvious focalities. Speech is fluent. Coordination is intact. Psychiatric: Judgment and insight are intact. Affect is appropriate.   ECOG = 1  0 - Asymptomatic (Fully active, able to carry on all predisease activities without restriction)  1 - Symptomatic but completely ambulatory (Restricted in physically strenuous activity but ambulatory and able to carry out work of a light or sedentary nature. For example, light housework, office work)  2 - Symptomatic, <50% in bed during the day (Ambulatory and capable of all self care but unable to carry out any work activities. Up and about more than 50% of waking hours)  3 - Symptomatic, >50% in bed, but not  bedbound (Capable of only limited self-care, confined to bed or chair 50% or more of waking hours)  4 - Bedbound (Completely disabled. Cannot carry on any self-care. Totally confined to bed or chair)  5 - Death   Eustace Pen MM, Creech RH, Tormey DC, et al. [id number]). "Toxicity and response criteria of the [facility]". [place] Oncol. 5  (6): 649-55  LABORATORY DATA:  Lab Results  Component Value Date   WBC 4.9 04/21/2016   HGB 13.0 04/21/2016   HCT 38.9 04/21/2016   MCV 81 (L) 04/21/2016   PLT 140 (L) 04/21/2016   NEUTROABS 3.0 04/21/2016   Lab Results  Component Value Date   NA 144 04/21/2016   K 3.1 (L) 04/21/2016   CL 106 04/21/2016   CO2 28 04/21/2016   GLUCOSE 208 (H) 04/21/2016   CREATININE 1.0 04/21/2016   CALCIUM 9.8 04/21/2016      RADIOGRAPHY: No results found.    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 The patient would be at risk for local regional recurrence and would likely benefit from radiation therapy directed to the pelvis region. We discussed the pathologic findings and location of his lesion as it would likely benefit from radiation therapy. The patient does wish to be aggressive with his disease management and wishes to proceed with radiation therapy as part of his treatment, given his young age and excellent performance status. I discussed the course of treatment, side effects, and potential toxicities with the patient and his mother. He appears to understand and wishes to proceed with treatment.  PLAN: CT simulation and treatment planning is scheduled for 05/10/16.  Anticipate treatment to begin the following week. Anticipate  5-1/2 weeks of radiation therapy, along with radiosensitizing chemotherapy (Xeloda).     ------------------------------------------------  Blair Promise, PhD, MD    This document serves as a record of services personally performed by Gery Pray, MD. It was created on his behalf by Bethann Humble, a trained medical scribe. The creation of this record is based on the scribe's personal observations and the provider's statements to them. This document has been checked and approved by the attending provider.

## 2016-05-05 NOTE — Progress Notes (Signed)
Please see the Nurse Progress Note in the MD Initial Consult Encounter for this patient. 

## 2016-05-10 ENCOUNTER — Ambulatory Visit
Admission: RE | Admit: 2016-05-10 | Discharge: 2016-05-10 | Disposition: A | Discharge status: Home / Self Care | Payer: BLUE CROSS/BLUE SHIELD | Source: Ambulatory Visit | Attending: Radiation Oncology | Admitting: Radiation Oncology

## 2016-05-10 DIAGNOSIS — C187 Malignant neoplasm of sigmoid colon: Secondary | ICD-10-CM

## 2016-05-10 DIAGNOSIS — C772 Secondary and unspecified malignant neoplasm of intra-abdominal lymph nodes: Principal | ICD-10-CM

## 2016-05-10 DIAGNOSIS — Z510 Encounter for antineoplastic radiation therapy: Secondary | ICD-10-CM | POA: Diagnosis not present

## 2016-05-10 NOTE — Progress Notes (Signed)
  Radiation Oncology         (336) [phone] ________________________________  Name: Vincent Strickland MRN: [id number]  Date: 05/10/2016  DOB: 05-Apr-1965  SIMULATION AND TREATMENT PLANNING NOTE    ICD-9-CM ICD-10-CM   1. 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  Identity was confirmed.  All relevant records and images related to the planned course of therapy were reviewed.  The patient freely provided informed written consent to proceed with treatment after reviewing the details related to the planned course of therapy. The consent form was witnessed and verified by the simulation staff.  Then, the patient was set-up in a stable reproducible  supine position for radiation therapy.  CT images were obtained.  Surface markings were placed.  The CT images were loaded into the planning software.  Then the target and avoidance structures were contoured.  Treatment planning then occurred.  The radiation prescription was entered and confirmed.  Then, I designed and supervised the construction of a total of 5 medically necessary complex treatment devices.  I have requested : 3D Simulation  I have requested a DVH of the following structures: surgical clips(CTV) bladder, rectum, bowel.  I have ordered:dose calc.  PLAN:  The patient will receive 45 Gy in 25 fractions followed by a boost to the upper pelvis of 5.4 Gy for a cumulative dose of 50.4.   Special Treatment Procedure Note: The patient will be receiving radiosensitizing chemotherapy. Given the potential of increased toxicities related to combined therapy and the necessity for close monitoring of the patient and blood work, this constitutes a special treatment procedure. -----------------------------------  Blair Promise, PhD, MD  This document serves  as a record of services personally performed by Gery Pray, MD. It was created on his behalf by Bethann Humble, a trained medical scribe. The creation of this record is based on the scribe's personal observations and the provider's statements to them. This document has been checked and approved by the attending provider.

## 2016-05-13 ENCOUNTER — Other Ambulatory Visit: Payer: Self-pay | Admitting: Hematology & Oncology

## 2016-05-13 DIAGNOSIS — Z85038 Personal history of other malignant neoplasm of large intestine: Secondary | ICD-10-CM

## 2016-05-13 DIAGNOSIS — C772 Secondary and unspecified malignant neoplasm of intra-abdominal lymph nodes: Secondary | ICD-10-CM

## 2016-05-13 DIAGNOSIS — Z510 Encounter for antineoplastic radiation therapy: Secondary | ICD-10-CM | POA: Diagnosis not present

## 2016-05-13 DIAGNOSIS — C187 Malignant neoplasm of sigmoid colon: Secondary | ICD-10-CM

## 2016-05-17 ENCOUNTER — Ambulatory Visit
Admission: RE | Admit: 2016-05-17 | Discharge: 2016-05-17 | Disposition: A | Discharge status: Home / Self Care | Payer: BLUE CROSS/BLUE SHIELD | Source: Ambulatory Visit | Attending: Radiation Oncology | Admitting: Radiation Oncology

## 2016-05-17 DIAGNOSIS — C772 Secondary and unspecified malignant neoplasm of intra-abdominal lymph nodes: Principal | ICD-10-CM

## 2016-05-17 DIAGNOSIS — Z510 Encounter for antineoplastic radiation therapy: Secondary | ICD-10-CM | POA: Diagnosis not present

## 2016-05-17 DIAGNOSIS — C187 Malignant neoplasm of sigmoid colon: Secondary | ICD-10-CM

## 2016-05-17 NOTE — Progress Notes (Signed)
  Radiation Oncology         [phone] ________________________________  Name: Vincent Strickland MRN: [id number]  Date: 05/17/2016  DOB: [birth year]  Simulation Verification Note    ICD-9-CM ICD-10-CM   1. 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 The clinical setup was verified. Then port films were obtained and uploaded to the radiation oncology medical record software.  The treatment beams were carefully compared against the planned radiation fields. The position location and shape of the radiation fields was reviewed. They targeted volume of tissue appears to be appropriately covered by the radiation beams. Organs at risk appear to be excluded as planned.  Based on my personal review, I approved the simulation verification. The patient's treatment will proceed as planned.  -----------------------------------  Blair Promise, PhD, MD

## 2016-05-18 ENCOUNTER — Ambulatory Visit
Admission: RE | Admit: 2016-05-18 | Discharge: 2016-05-18 | Disposition: A | Discharge status: Home / Self Care | Payer: BLUE CROSS/BLUE SHIELD | Source: Ambulatory Visit | Attending: Radiation Oncology | Admitting: Radiation Oncology

## 2016-05-18 DIAGNOSIS — Z510 Encounter for antineoplastic radiation therapy: Secondary | ICD-10-CM | POA: Diagnosis not present

## 2016-05-18 DIAGNOSIS — C772 Secondary and unspecified malignant neoplasm of intra-abdominal lymph nodes: Principal | ICD-10-CM

## 2016-05-18 DIAGNOSIS — C187 Malignant neoplasm of sigmoid colon: Secondary | ICD-10-CM

## 2016-05-18 NOTE — Progress Notes (Signed)
Pt here for patient teaching.  Pt given Radiation and You booklet. Reviewed areas of pertinence such as diarrhea, fatigue, hair loss, nausea and vomiting, skin changes and urinary and bladder changes . Pt able to give teach back of to pat skin, use unscented/gentle soap, use baby wipes, have Imodium on hand, drink plenty of water and sitz bath,avoid applying anything to skin within 4 hours of treatment. Pt demonstrated understanding and verbalizes understanding of information given and will contact nursing with any questions or concerns.        

## 2016-05-19 ENCOUNTER — Ambulatory Visit
Admission: RE | Admit: 2016-05-19 | Discharge: 2016-05-19 | Disposition: A | Discharge status: Home / Self Care | Payer: BLUE CROSS/BLUE SHIELD | Source: Ambulatory Visit | Attending: Radiation Oncology | Admitting: Radiation Oncology

## 2016-05-19 DIAGNOSIS — Z510 Encounter for antineoplastic radiation therapy: Secondary | ICD-10-CM | POA: Diagnosis not present

## 2016-05-20 ENCOUNTER — Other Ambulatory Visit: Payer: Self-pay | Admitting: Radiation Oncology

## 2016-05-20 ENCOUNTER — Ambulatory Visit
Admission: RE | Admit: 2016-05-20 | Discharge: 2016-05-20 | Disposition: A | Discharge status: Home / Self Care | Payer: BLUE CROSS/BLUE SHIELD | Source: Ambulatory Visit | Attending: Radiation Oncology | Admitting: Radiation Oncology

## 2016-05-20 DIAGNOSIS — Z510 Encounter for antineoplastic radiation therapy: Secondary | ICD-10-CM | POA: Diagnosis not present

## 2016-05-21 ENCOUNTER — Encounter: Payer: Self-pay | Admitting: *Deleted

## 2016-05-21 ENCOUNTER — Ambulatory Visit
Admission: RE | Admit: 2016-05-21 | Discharge: 2016-05-21 | Disposition: A | Discharge status: Home / Self Care | Payer: BLUE CROSS/BLUE SHIELD | Source: Ambulatory Visit | Attending: Radiation Oncology | Admitting: Radiation Oncology

## 2016-05-21 DIAGNOSIS — C772 Secondary and unspecified malignant neoplasm of intra-abdominal lymph nodes: Principal | ICD-10-CM

## 2016-05-21 DIAGNOSIS — C187 Malignant neoplasm of sigmoid colon: Secondary | ICD-10-CM

## 2016-05-21 DIAGNOSIS — Z510 Encounter for antineoplastic radiation therapy: Secondary | ICD-10-CM | POA: Diagnosis not present
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
## 2016-05-24 ENCOUNTER — Encounter: Payer: Self-pay | Admitting: Family Medicine

## 2016-05-24 ENCOUNTER — Ambulatory Visit
Admission: RE | Admit: 2016-05-24 | Discharge: 2016-05-24 | Disposition: A | Discharge status: Home / Self Care | Payer: BLUE CROSS/BLUE SHIELD | Source: Ambulatory Visit | Attending: Radiation Oncology | Admitting: Radiation Oncology

## 2016-05-24 DIAGNOSIS — Z510 Encounter for antineoplastic radiation therapy: Secondary | ICD-10-CM | POA: Diagnosis not present

## 2016-05-25 ENCOUNTER — Ambulatory Visit
Admission: RE | Admit: 2016-05-25 | Discharge: 2016-05-25 | Disposition: A | Discharge status: Home / Self Care | Payer: BLUE CROSS/BLUE SHIELD | Source: Ambulatory Visit | Attending: Radiation Oncology | Admitting: Radiation Oncology

## 2016-05-25 DIAGNOSIS — Z510 Encounter for antineoplastic radiation therapy: Secondary | ICD-10-CM | POA: Diagnosis not present

## 2016-05-26 ENCOUNTER — Ambulatory Visit
Admission: RE | Admit: 2016-05-26 | Discharge: 2016-05-26 | Disposition: A | Discharge status: Home / Self Care | Payer: BLUE CROSS/BLUE SHIELD | Source: Ambulatory Visit | Attending: Radiation Oncology | Admitting: Radiation Oncology

## 2016-05-26 DIAGNOSIS — Z510 Encounter for antineoplastic radiation therapy: Secondary | ICD-10-CM | POA: Diagnosis not present

## 2016-05-27 ENCOUNTER — Ambulatory Visit
Admission: RE | Admit: 2016-05-27 | Discharge: 2016-05-27 | Disposition: A | Discharge status: Home / Self Care | Payer: BLUE CROSS/BLUE SHIELD | Source: Ambulatory Visit | Attending: Radiation Oncology | Admitting: Radiation Oncology

## 2016-05-27 DIAGNOSIS — Z510 Encounter for antineoplastic radiation therapy: Secondary | ICD-10-CM | POA: Diagnosis not present

## 2016-05-28 ENCOUNTER — Encounter: Payer: Self-pay | Admitting: *Deleted

## 2016-05-28 ENCOUNTER — Ambulatory Visit
Admission: RE | Admit: 2016-05-28 | Discharge: 2016-05-28 | Disposition: A | Discharge status: Home / Self Care | Payer: BLUE CROSS/BLUE SHIELD | Source: Ambulatory Visit | Attending: Radiation Oncology | Admitting: Radiation Oncology

## 2016-05-28 DIAGNOSIS — Z510 Encounter for antineoplastic radiation therapy: Secondary | ICD-10-CM | POA: Diagnosis not present

## 2016-05-28 NOTE — Progress Notes (Signed)
[place] Work  Clinical Social Work was referred by patient for assessment of psychosocial needs and supportive counseling session. Clinical Social Worker met with patient at [facility] to offer support and assess for needs. CSW provided supportive counseling session. Pt coping with complex family dynamics on top of cancer treatment.  Pt has strong coping abilities and positive support. CSW and pt processed situation/additional coping techniques and resources to further address complex relationship issues with daughter. CSW and pt to meet again next Thursday. CSW to follow.   Clinical Social Work interventions: Supportive counseling  [name], LCSW, OSW-C Clinical Social Worker [place]  [place] Phone: [phone] Fax: [phone]

## 2016-05-31 ENCOUNTER — Ambulatory Visit
Admission: RE | Admit: 2016-05-31 | Discharge: 2016-05-31 | Disposition: A | Discharge status: Home / Self Care | Payer: BLUE CROSS/BLUE SHIELD | Source: Ambulatory Visit | Attending: Radiation Oncology | Admitting: Radiation Oncology

## 2016-05-31 DIAGNOSIS — Z510 Encounter for antineoplastic radiation therapy: Secondary | ICD-10-CM | POA: Diagnosis not present

## 2016-06-01 ENCOUNTER — Ambulatory Visit
Admission: RE | Admit: 2016-06-01 | Discharge: 2016-06-01 | Disposition: A | Discharge status: Home / Self Care | Payer: BLUE CROSS/BLUE SHIELD | Source: Ambulatory Visit | Attending: Radiation Oncology | Admitting: Radiation Oncology

## 2016-06-01 ENCOUNTER — Telehealth: Payer: Self-pay | Admitting: *Deleted

## 2016-06-01 DIAGNOSIS — Z510 Encounter for antineoplastic radiation therapy: Secondary | ICD-10-CM | POA: Diagnosis not present

## 2016-06-01 NOTE — Telephone Encounter (Signed)
Oral Chemotherapy Follow-Up Form Original Start date of oral chemotherapy: 05/17/2016 ? Called patient today to follow up regarding patient's oral chemotherapy medication: Xeldoda 2000mg   Pt is doing well today  Pt reports 0  tablets/doses missed in the last week/month.   Pt reports the following side effects: Neuropathy  ?

## 2016-06-02 ENCOUNTER — Ambulatory Visit
Admission: RE | Admit: 2016-06-02 | Discharge: 2016-06-02 | Disposition: A | Discharge status: Home / Self Care | Payer: BLUE CROSS/BLUE SHIELD | Source: Ambulatory Visit | Attending: Radiation Oncology | Admitting: Radiation Oncology

## 2016-06-02 DIAGNOSIS — Z510 Encounter for antineoplastic radiation therapy: Secondary | ICD-10-CM | POA: Diagnosis not present

## 2016-06-03 ENCOUNTER — Ambulatory Visit
Admission: RE | Admit: 2016-06-03 | Discharge: 2016-06-03 | Disposition: A | Discharge status: Home / Self Care | Payer: BLUE CROSS/BLUE SHIELD | Source: Ambulatory Visit | Attending: Radiation Oncology | Admitting: Radiation Oncology

## 2016-06-03 ENCOUNTER — Encounter: Payer: Self-pay | Admitting: *Deleted

## 2016-06-03 DIAGNOSIS — Z510 Encounter for antineoplastic radiation therapy: Secondary | ICD-10-CM | POA: Diagnosis not present

## 2016-06-03 NOTE — Progress Notes (Signed)
[place] Clinical Social Work  CSW provided supportive counseling session. Pt coping with complex family dynamics on top of cancer treatment.  Pt has strong coping abilities and positive support. CSW and pt processed situation/additional coping techniques and resources to further address complex relationship issues with wife, son and family. CSW encouraged pt to consider additional sources of support and take time to recharge emotionally as needed. Pt appears to have limited support both physically and emotionally from his immediate family. This is very difficult for him as he feels they do not want to help. Pt has been main caregiver and provider for his entire immediate family and was very hopeful family would assist him during this time of need. CSW and pt processed complex feelings around this difficult situation. CSW and pt to meet again next Friday. CSW to follow.   Clinical Social Work interventions: Supportive counseling  [name], LCSW, OSW-C Clinical Social Worker [place]  [place] Phone: [phone] Fax: [phone]

## 2016-06-04 ENCOUNTER — Ambulatory Visit (HOSPITAL_BASED_OUTPATIENT_CLINIC_OR_DEPARTMENT_OTHER): Payer: BLUE CROSS/BLUE SHIELD

## 2016-06-04 ENCOUNTER — Ambulatory Visit
Admission: RE | Admit: 2016-06-04 | Discharge: 2016-06-04 | Disposition: A | Discharge status: Home / Self Care | Payer: BLUE CROSS/BLUE SHIELD | Source: Ambulatory Visit | Attending: Radiation Oncology | Admitting: Radiation Oncology

## 2016-06-04 ENCOUNTER — Other Ambulatory Visit (HOSPITAL_BASED_OUTPATIENT_CLINIC_OR_DEPARTMENT_OTHER): Payer: BLUE CROSS/BLUE SHIELD

## 2016-06-04 ENCOUNTER — Ambulatory Visit (HOSPITAL_BASED_OUTPATIENT_CLINIC_OR_DEPARTMENT_OTHER): Payer: BLUE CROSS/BLUE SHIELD | Admitting: Hematology & Oncology

## 2016-06-04 DIAGNOSIS — C187 Malignant neoplasm of sigmoid colon: Secondary | ICD-10-CM

## 2016-06-04 DIAGNOSIS — Z510 Encounter for antineoplastic radiation therapy: Secondary | ICD-10-CM | POA: Diagnosis not present

## 2016-06-04 DIAGNOSIS — Z85038 Personal history of other malignant neoplasm of large intestine: Secondary | ICD-10-CM

## 2016-06-04 DIAGNOSIS — E876 Hypokalemia: Secondary | ICD-10-CM

## 2016-06-04 DIAGNOSIS — C779 Secondary and unspecified malignant neoplasm of lymph node, unspecified: Secondary | ICD-10-CM

## 2016-06-04 DIAGNOSIS — Z95828 Presence of other vascular implants and grafts: Secondary | ICD-10-CM

## 2016-06-04 LAB — CMP (CANCER CENTER ONLY)
ALT(SGPT): 36 U/L (ref 10–47)
AST: 28 U/L (ref 11–38)
Albumin: 3.5 g/dL (ref 3.3–5.5)
Alkaline Phosphatase: 71 U/L (ref 26–84)
BILIRUBIN TOTAL: 1 mg/dL (ref 0.20–1.60)
BUN: 14 mg/dL (ref 7–22)
CHLORIDE: 106 meq/L (ref 98–108)
CO2: 28 meq/L (ref 18–33)
CREATININE: 1 mg/dL (ref 0.6–1.2)
Calcium: 9.3 mg/dL (ref 8.0–10.3)
GLUCOSE: 101 mg/dL (ref 73–118)
Potassium: 3.2 mEq/L — ABNORMAL LOW (ref 3.3–4.7)
Sodium: 139 mEq/L (ref 128–145)
Total Protein: 6.9 g/dL (ref 6.4–8.1)

## 2016-06-04 LAB — CBC WITH DIFFERENTIAL (CANCER CENTER ONLY)
BASO#: 0 10*3/uL (ref 0.0–0.2)
BASO%: 0.4 % (ref 0.0–2.0)
EOS ABS: 0 10*3/uL (ref 0.0–0.5)
EOS%: 0.2 % (ref 0.0–7.0)
HCT: 38.3 % — ABNORMAL LOW (ref 38.7–49.9)
HEMOGLOBIN: 12.8 g/dL — AB (ref 13.0–17.1)
LYMPH#: 0.9 10*3/uL (ref 0.9–3.3)
LYMPH%: 18.3 % (ref 14.0–48.0)
MCH: 28.4 pg (ref 28.0–33.4)
MCHC: 33.4 g/dL (ref 32.0–35.9)
MCV: 85 fL (ref 82–98)
MONO#: 0.4 10*3/uL (ref 0.1–0.9)
MONO%: 9.2 % (ref 0.0–13.0)
NEUT%: 71.9 % (ref 40.0–80.0)
NEUTROS ABS: 3.4 10*3/uL (ref 1.5–6.5)
PLATELETS: 157 10*3/uL (ref 145–400)
RBC: 4.51 10*6/uL (ref 4.20–5.70)
RDW: 19.7 % — ABNORMAL HIGH (ref 11.1–15.7)
WBC: 4.8 10*3/uL (ref 4.0–10.0)

## 2016-06-04 MED ORDER — SODIUM CHLORIDE 0.9% FLUSH
10.0000 mL | INTRAVENOUS | Status: DC | PRN
  Administered 2016-06-04: 10 mL via INTRAVENOUS
  Filled 2016-06-04: qty 10

## 2016-06-04 MED ORDER — POTASSIUM CHLORIDE CRYS ER 20 MEQ PO TBCR: 40.0000 meq | EXTENDED_RELEASE_TABLET | Freq: Two times a day (BID) | ORAL | 4 refills | Status: DC

## 2016-06-04 MED ORDER — GABAPENTIN 300 MG PO CAPS: 600.0000 mg | ORAL_CAPSULE | Freq: Every day | ORAL | 3 refills | Status: DC

## 2016-06-04 MED ORDER — HEPARIN SOD (PORK) LOCK FLUSH 100 UNIT/ML IV SOLN
500.0000 [IU] | Freq: Once | INTRAVENOUS | Status: AC
  Administered 2016-06-04: 500 [IU] via INTRAVENOUS
  Filled 2016-06-04: qty 5

## 2016-06-04 NOTE — Patient Instructions (Signed)
Implanted Port Insertion, Care After °This sheet gives you information about how to care for yourself after your procedure. Your health care provider may also give you more specific instructions. If you have problems or questions, contact your health care provider. °What can I expect after the procedure? °After your procedure, it is common to have: °· Discomfort at the port insertion site. °· Bruising on the skin over the port. This should improve over 3-4 days. ° °Follow these instructions at home: °Port care °· After your port is placed, you will get a manufacturer's information card. The card has information about your port. Keep this card with you at all times. °· Take care of the port as told by your health care provider. Ask your health care provider if you or a family member can get training for taking care of the port at home. A home health care nurse may also take care of the port. °· Make sure to remember what type of port you have. °Incision care °· Follow instructions from your health care provider about how to take care of your port insertion site. Make sure you: °? Wash your hands with soap and water before you change your bandage (dressing). If soap and water are not available, use hand sanitizer. °? Change your dressing as told by your health care provider. °? Leave stitches (sutures), skin glue, or adhesive strips in place. These skin closures may need to stay in place for 2 weeks or longer. If adhesive strip edges start to loosen and curl up, you may trim the loose edges. Do not remove adhesive strips completely unless your health care provider tells you to do that. °· Check your port insertion site every day for signs of infection. Check for: °? More redness, swelling, or pain. °? More fluid or blood. °? Warmth. °? Pus or a bad smell. °General instructions °· Do not take baths, swim, or use a hot tub until your health care provider approves. °· Do not lift anything that is heavier than 10 lb (4.5  kg) for a week, or as told by your health care provider. °· Ask your health care provider when it is okay to: °? Return to work or school. °? Resume usual physical activities or sports. °· Do not drive for 24 hours if you were given a medicine to help you relax (sedative). °· Take over-the-counter and prescription medicines only as told by your health care provider. °· Wear a medical alert bracelet in case of an emergency. This will tell any health care providers that you have a port. °· Keep all follow-up visits as told by your health care provider. This is important. °Contact a health care provider if: °· You cannot flush your port with saline as directed, or you cannot draw blood from the port. °· You have a fever or chills. °· You have more redness, swelling, or pain around your port insertion site. °· You have more fluid or blood coming from your port insertion site. °· Your port insertion site feels warm to the touch. °· You have pus or a bad smell coming from the port insertion site. °Get help right away if: °· You have chest pain or shortness of breath. °· You have bleeding from your port that you cannot control. °Summary °· Take care of the port as told by your health care provider. °· Change your dressing as told by your health care provider. °· Keep all follow-up visits as told by your health care provider. °  This information is not intended to replace advice given to you by your health care provider. Make sure you discuss any questions you have with your health care provider. °Document Released: 11/01/2012 Document Revised: 12/03/2015 Document Reviewed: 12/03/2015 °Elsevier Interactive Patient Education © 2017 [employer] Inc[employer] ° °

## 2016-06-07 ENCOUNTER — Ambulatory Visit
Admission: RE | Admit: 2016-06-07 | Discharge: 2016-06-07 | Disposition: A | Discharge status: Home / Self Care | Payer: BLUE CROSS/BLUE SHIELD | Source: Ambulatory Visit | Attending: Radiation Oncology | Admitting: Radiation Oncology

## 2016-06-07 DIAGNOSIS — Z510 Encounter for antineoplastic radiation therapy: Secondary | ICD-10-CM | POA: Diagnosis not present

## 2016-06-08 ENCOUNTER — Ambulatory Visit
Admission: RE | Admit: 2016-06-08 | Discharge: 2016-06-08 | Disposition: A | Discharge status: Home / Self Care | Payer: BLUE CROSS/BLUE SHIELD | Source: Ambulatory Visit | Attending: Radiation Oncology | Admitting: Radiation Oncology

## 2016-06-08 DIAGNOSIS — Z510 Encounter for antineoplastic radiation therapy: Secondary | ICD-10-CM | POA: Diagnosis not present

## 2016-06-08 NOTE — Progress Notes (Signed)
Hematology and Oncology Follow Up Visit  Vincent Strickland [id number] 10-03-1965 51 y.o. 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 Lusk is here today with his wife for follow-up. He is about to Thursday weight through his radiation. He is doing well with this. He's still having some issues with neuropathy. This is mostly in his feet. This probably is from the FOLFOX. However, the Xeloda might be contributing.  He has had no nausea or vomiting. He's had some fatigue. He still trying to work.  There is quite a lot of stress at home. Apparently, his son is having a very hard time. A daughter moved out of the home. She is now living with a boyfriend. She does not want Mr. Becraft or his wife to see her again.  I'm going to see if a therapist can help out his son. This really is causing a lot of problems for him.  His last CEA was 2.3. This is holding steady.  He has had no cough. He's had no rashes. He has had some shortness of breath.  He has had no leg swelling.  There has been no significant weight loss or weight gain.   Overall, his performance status is ECOG 1.  Medications:  Allergies as of 06/04/2016      Reactions   Niaspan [niacin]    Severe flushing   Percocet [oxycodone-acetaminophen] Nausea Only   hallucinations      Medication List       Accurate as of 06/04/16 11:59 PM. Always use your most recent med list.          acetaminophen 500 MG tablet Commonly known as:  TYLENOL Take 1,000 mg by mouth every 6 (six) hours as needed (for pain.).   acetaminophen-codeine 300-30 MG tablet Commonly known as:  TYLENOL #3 Take 1-2 tablets by mouth every 4 (four) hours as needed for moderate pain.   capecitabine 500 MG tablet Commonly known as:  XELODA TAKE 4 TABLETS BY MOUTH TWICE  DAILY WITH FOOD MONDAY THROUGH FRIDAY   capecitabine 500 MG tablet Commonly known as:  XELODA TAKE 4 TABLETS BY MOUTH TWICE DAILY WITH FOOD MONDAY THROUGH FRIDAY   dexamethasone 4 MG tablet Commonly known as:  DECADRON TAKE 2 TABLETS BY MOUTH EVERY DAY **START THE DAY AFTER CHEMOTHERAPY FOR 2 DAYS. TAKE WITH FOOD   gabapentin 300 MG capsule Commonly known as:  NEURONTIN Take 2 capsules (600 mg total) by mouth at bedtime.   HYDROcodone-acetaminophen 5-325 MG tablet Commonly known as:  NORCO Take 1 tablet by mouth every 4 (four) hours as needed for moderate pain.   lidocaine-prilocaine cream Commonly known as:  EMLA Apply to affected area once   LORazepam 0.5 MG tablet Commonly known as:  ATIVAN Take 1 tablet (0.5 mg total) by mouth every 6 (six) hours as needed (Nausea or vomiting).   losartan-hydrochlorothiazide 100-25 MG tablet Commonly known as:  HYZAAR Take 1 tablet by mouth daily.   ondansetron 8 MG tablet Commonly known as:  ZOFRAN Take 1 tablet (8 mg total) by mouth 2 (two) times daily as needed for refractory nausea / vomiting. Start on day 3 after chemotherapy.   potassium chloride SA 20 MEQ tablet Commonly known as:  K-DUR,KLOR-CON Take 2 tablets (40 mEq total) by mouth 2 (two) times daily.  prochlorperazine 10 MG tablet Commonly known as:  COMPAZINE Take 1 tablet (10 mg total) by mouth every 6 (six) hours as needed (Nausea or vomiting).   saccharomyces boulardii 250 MG capsule Commonly known as:  FLORASTOR Take 1 capsule (250 mg total) by mouth 2 (two) times daily.   silver sulfADIAZINE 1 % cream Commonly known as:  SILVADENE Apply 1 application topically daily. Apply to affected area twice a day       Allergies:  Allergies  Allergen Reactions  . Niaspan [Niacin]     Severe flushing  . Percocet [Oxycodone-Acetaminophen] Nausea Only    hallucinations    Past Medical History, Surgical history, Social history, and Family History were reviewed and  updated.  Review of Systems: All other 10 point review of systems is negative.   Physical Exam:  weight is 256 lb 0.6 oz (116.1 kg). His oral temperature is 98 F (36.7 C). His blood pressure is 136/80 and his pulse is 67. His respiration is 20 and oxygen saturation is 98%.   Wt Readings from Last 3 Encounters:  06/04/16 256 lb 0.6 oz (116.1 kg)  05/05/16 252 lb 3.2 oz (114.4 kg)  04/07/16 247 lb 8 oz (112.3 kg)    Head and neck exam shows no ocular or oral lesions. He has no scleral icterus. There is no conjunctival pallor. There is no adenopathy in the neck. Lungs are clear bilaterally. Cardiac exam regular rate and rhythm with no murmurs, rubs or bruits. Abdomen is soft. His laparoscopy scar is well-healed. He has no fluid wave. There is no guarding or rebound tenderness. Maybe some slight tenderness in the left lower quadrant. He has no palpable liver or spleen tip. Back exam shows no tenderness over the spine, ribs or hips. Extremities shows no clubbing, cyanosis or edema. He has good range of motion of his joints. Skin exam shows no rashes, ecchymosis or petechia. He does have several hyperpigmented lesions which do not appear to be suspicious. Neurological exam shows no focal neurological deficits.   Lab Results  Component Value Date   WBC 4.8 06/04/2016   HGB 12.8 (L) 06/04/2016   HCT 38.3 (L) 06/04/2016   MCV 85 06/04/2016   PLT 157 06/04/2016   Lab Results  Component Value Date   FERRITIN 34 01/13/2016   IRON 41 (L) 01/13/2016   TIBC 369 01/13/2016   UIBC 328 01/13/2016   IRONPCTSAT 11 (L) 01/13/2016   Lab Results  Component Value Date   RBC 4.51 06/04/2016   No results found for: KPAFRELGTCHN, LAMBDASER, KAPLAMBRATIO No results found for: IGGSERUM, IGA, IGMSERUM No results found for: Ronnald Ramp, A1GS, A2GS, Tillman Sers, SPEI   Chemistry      Component Value Date/Time   NA 139 06/04/2016 1130   K 3.2 (L) 06/04/2016 1130   CL 106  06/04/2016 1130   CO2 28 06/04/2016 1130   BUN 14 06/04/2016 1130   CREATININE 1.0 06/04/2016 1130      Component Value Date/Time   CALCIUM 9.3 06/04/2016 1130   ALKPHOS 71 06/04/2016 1130   AST 28 06/04/2016 1130   ALT 36 06/04/2016 1130   BILITOT 1.00 06/04/2016 1130     Impression and Plan: Mr. Morefield is a pleasant 51 yo gentleman with 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   I'm trying to be aggressive with his therapy. I cannot get over the fact that he has the 5 lymph nodes that are positive.   I  really think that he has done well. Hopefully, the neuropathy will improve.  He is taking 2000 mg twice a day of the Xeloda. Again I think he is tolerating this pretty well.  He is on Neurontin. I told him to take 300 mg of Neurontin in the daytime and then keep with the 2 Neurontin (600 mg) at bedtime.  I just really feel bad that he is struggling with all the family issues. I am very surprised that his children have dealt with his situation is such a aggressive manner.  Maybe, a therapist will be over to help.  I'll like to see him back in about 3 weeks. We will see how his neuropathy is doing.  Once he completes the radiation therapy with Xeloda, we will get him back on full dose chemotherapy for 3 more cycles.  I spent about 35-40 minutes with he and his wife. They do have a strong faith.     [name], MD 5/15/20186:49 PM

## 2016-06-09 ENCOUNTER — Ambulatory Visit
Admission: RE | Admit: 2016-06-09 | Discharge: 2016-06-09 | Disposition: A | Discharge status: Home / Self Care | Payer: BLUE CROSS/BLUE SHIELD | Source: Ambulatory Visit | Attending: Radiation Oncology | Admitting: Radiation Oncology

## 2016-06-09 DIAGNOSIS — Z510 Encounter for antineoplastic radiation therapy: Secondary | ICD-10-CM | POA: Diagnosis not present

## 2016-06-10 ENCOUNTER — Ambulatory Visit
Admission: RE | Admit: 2016-06-10 | Discharge: 2016-06-10 | Disposition: A | Discharge status: Home / Self Care | Payer: BLUE CROSS/BLUE SHIELD | Source: Ambulatory Visit | Attending: Radiation Oncology | Admitting: Radiation Oncology

## 2016-06-10 DIAGNOSIS — Z510 Encounter for antineoplastic radiation therapy: Secondary | ICD-10-CM | POA: Diagnosis not present

## 2016-06-11 ENCOUNTER — Encounter: Payer: Self-pay | Admitting: *Deleted

## 2016-06-11 ENCOUNTER — Ambulatory Visit
Admission: RE | Admit: 2016-06-11 | Discharge: 2016-06-11 | Disposition: A | Discharge status: Home / Self Care | Payer: BLUE CROSS/BLUE SHIELD | Source: Ambulatory Visit | Attending: Radiation Oncology | Admitting: Radiation Oncology

## 2016-06-11 DIAGNOSIS — Z510 Encounter for antineoplastic radiation therapy: Secondary | ICD-10-CM | POA: Diagnosis not present

## 2016-06-14 ENCOUNTER — Ambulatory Visit
Admission: RE | Admit: 2016-06-14 | Discharge: 2016-06-14 | Disposition: A | Discharge status: Home / Self Care | Payer: BLUE CROSS/BLUE SHIELD | Source: Ambulatory Visit | Attending: Radiation Oncology | Admitting: Radiation Oncology

## 2016-06-14 DIAGNOSIS — Z510 Encounter for antineoplastic radiation therapy: Secondary | ICD-10-CM | POA: Diagnosis not present

## 2016-06-15 ENCOUNTER — Encounter: Payer: Self-pay | Admitting: Radiation Oncology

## 2016-06-15 ENCOUNTER — Ambulatory Visit
Admission: RE | Admit: 2016-06-15 | Discharge: 2016-06-15 | Disposition: A | Discharge status: Home / Self Care | Payer: BLUE CROSS/BLUE SHIELD | Source: Ambulatory Visit | Attending: Radiation Oncology | Admitting: Radiation Oncology

## 2016-06-15 VITALS — BP 148/90 | HR 73 | Temp 98.5°F | Ht 73.0 in | Wt 257.2 lb

## 2016-06-15 DIAGNOSIS — Z85038 Personal history of other malignant neoplasm of large intestine: Secondary | ICD-10-CM

## 2016-06-15 DIAGNOSIS — Z510 Encounter for antineoplastic radiation therapy: Secondary | ICD-10-CM | POA: Diagnosis not present

## 2016-06-15 NOTE — Progress Notes (Signed)
  Radiation Oncology         [phone] ________________________________  Name: Vincent Strickland MRN: [id number]  Date: 06/15/2016  DOB: [birth year]  Weekly Radiation Therapy Management   Principle Diagnosis:  Stage IIIC (W8SH6O3) adenocarcinoma of the upper rectum  Current Therapy:        FOLFOX s/p cycle #7 - completed on 04/21/2016 Radiation with Xeloda - started on 05/10/2016  Current Dose: 37.8 Gy     Planned Dose:  50.4 Gy  Narrative . . . . . . . . The patient presents for routine under treatment assessment.                                   Vincent Strickland has completed 21 fractions to his rectum.  He continues to have neuropathy in his feet and also numbness in his buttocks. He is taking Xeloda. He had been taking decadron 4 mg once a day for nausea.  He reports nocutuia x 1. He had diarrhea 3 weeks ago and is now having softer stools.  He continues to take Miralax twice a day.  He denies having any rectal bleeding in the past 2 weeks.  He reports having a rash above his rectal area and is going to start using Silvadene in this area.  He reports having fatigue and is working full time.                                 Set-up films were reviewed.                                 The chart was checked. Physical Findings. . .  height is 6\' 1"  (1.854 m) and weight is 257 lb 3.2 oz (116.7 kg). His oral temperature is 98.5 F (36.9 C). His blood pressure is 148/90 (abnormal) and his pulse is 73. His oxygen saturation is 100%. . Lungs are clear to auscultation bilaterally. Heart has regular rate and rhythm. No palpable cervical, supraclavicular, or axillary adenopathy. Abdomen soft, non-tender, normal bowel sounds. Impression . . . . . . . The patient is tolerating radiation. Plan . . . . . . . . . . . . Continue treatment as planned. I recommended he stop using Decadron for nausea and to switch over to Zofran. The patient has gained at least 5 pounds course of his radiation therapy likely related to  Decadron and appetite issues.  ________________________________   Blair Promise, PhD, MD

## 2016-06-15 NOTE — Progress Notes (Signed)
Vincent Strickland has completed 21 fractions to his rectum.  He continues to have neuropathy in his feet and also numbness in his buttocks. He is taking Xeloda. He had been taking decadron 4 mg once a day for nausea.  He reports nocutuia x 1. He had diarrhea 3 weeks ago and is now having softer stools.  He continues to take Miralax twice a day.  He denies having any rectal bleeding in the past 2 weeks.  He reports having a rash above his rectal area and is going to start using Silvadene in this area.  He reports having fatigue and is working full time.  BP (!) 148/90 (BP Location: Left Arm, Patient Position: Sitting)   Pulse 73   Temp 98.5 F (36.9 C) (Oral)   Ht 6\' 1"  (1.854 m)   Wt 257 lb 3.2 oz (116.7 kg)   SpO2 100%   BMI 33.93 kg/m    Wt Readings from Last 3 Encounters:  06/15/16 257 lb 3.2 oz (116.7 kg)  06/04/16 256 lb 0.6 oz (116.1 kg)  05/05/16 252 lb 3.2 oz (114.4 kg)

## 2016-06-16 ENCOUNTER — Ambulatory Visit
Admission: RE | Admit: 2016-06-16 | Discharge: 2016-06-16 | Disposition: A | Discharge status: Home / Self Care | Payer: BLUE CROSS/BLUE SHIELD | Source: Ambulatory Visit | Attending: Radiation Oncology | Admitting: Radiation Oncology

## 2016-06-16 DIAGNOSIS — Z510 Encounter for antineoplastic radiation therapy: Secondary | ICD-10-CM | POA: Diagnosis not present

## 2016-06-17 ENCOUNTER — Ambulatory Visit
Admission: RE | Admit: 2016-06-17 | Discharge: 2016-06-17 | Disposition: A | Discharge status: Home / Self Care | Payer: BLUE CROSS/BLUE SHIELD | Source: Ambulatory Visit | Attending: Radiation Oncology | Admitting: Radiation Oncology

## 2016-06-17 DIAGNOSIS — Z510 Encounter for antineoplastic radiation therapy: Secondary | ICD-10-CM | POA: Diagnosis not present

## 2016-06-18 ENCOUNTER — Ambulatory Visit
Admission: RE | Admit: 2016-06-18 | Discharge: 2016-06-18 | Disposition: A | Discharge status: Home / Self Care | Payer: BLUE CROSS/BLUE SHIELD | Source: Ambulatory Visit | Attending: Radiation Oncology | Admitting: Radiation Oncology

## 2016-06-18 ENCOUNTER — Encounter: Payer: Self-pay | Admitting: *Deleted

## 2016-06-18 DIAGNOSIS — Z510 Encounter for antineoplastic radiation therapy: Secondary | ICD-10-CM | POA: Diagnosis not present

## 2016-06-22 ENCOUNTER — Ambulatory Visit
Admission: RE | Admit: 2016-06-22 | Discharge: 2016-06-22 | Disposition: A | Discharge status: Home / Self Care | Payer: BLUE CROSS/BLUE SHIELD | Source: Ambulatory Visit | Attending: Radiation Oncology | Admitting: Radiation Oncology

## 2016-06-22 ENCOUNTER — Encounter: Payer: Self-pay | Admitting: Radiation Oncology

## 2016-06-22 VITALS — BP 128/94 | HR 79 | Temp 98.6°F | Ht 73.0 in | Wt 255.6 lb

## 2016-06-22 DIAGNOSIS — Z510 Encounter for antineoplastic radiation therapy: Secondary | ICD-10-CM | POA: Diagnosis not present

## 2016-06-22 DIAGNOSIS — C772 Secondary and unspecified malignant neoplasm of intra-abdominal lymph nodes: Principal | ICD-10-CM

## 2016-06-22 DIAGNOSIS — C187 Malignant neoplasm of sigmoid colon: Secondary | ICD-10-CM

## 2016-06-22 NOTE — Progress Notes (Signed)
[facility] Clinical Social Work  CSW provided supportive counseling session. Pt coping with complex family dynamics on top of cancer treatment.  CSW and pt processed complex feelings around this difficult situation. Pt discouraged due to lack of support from immediate family. CSW and pt processed that family may also have difficulty with cancer diagnosis. CSW and pt to meet again next Friday. CSW to follow.   Clinical Social Work interventions: Supportive counseling  [name], LCSW, OSW-C Clinical Social Worker [place]  [place] Phone: [phone] Fax: [phone]

## 2016-06-22 NOTE — Progress Notes (Signed)
Vincent Strickland has completed 25 fractions to his rectum.  He reports having mild joint pain today and also continues to have some neuropathy in his lower legs and fingers.  He continues to take Xeloda and will take his last dose tonight.  He denies having any diarrhea and is taking 2/3rds of a dose of Miralax twice a day.  He reports having mild nausea and is taking Zofran once a day.  He has stopped taking Decadron.  He said he is having some burning in his rectal area and is using silvadene as needed.  He reports having fatigue.  BP (!) 128/94 (BP Location: Right Arm, Patient Position: Sitting)   Pulse 79   Temp 98.6 F (37 C) (Oral)   Ht 6\' 1"  (1.854 m)   Wt 255 lb 9.6 oz (115.9 kg)   SpO2 98%   BMI 33.72 kg/m    Wt Readings from Last 3 Encounters:  06/22/16 255 lb 9.6 oz (115.9 kg)  06/15/16 257 lb 3.2 oz (116.7 kg)  06/04/16 256 lb 0.6 oz (116.1 kg)

## 2016-06-22 NOTE — Progress Notes (Signed)
[facility] Clinical Social Work  CSW provided supportive counseling session. Pt continues coping with complex family dynamics on top of cancer treatment. Pt had considered additional supports such as GI Support Group and [facility] activitities, but has not attended to date. CSW and pt processed complex feelings around lack of support. Pt continues to explore how to move forward with difficult family situation.   Clinical Social Work interventions: Supportive counseling  [name], LCSW, OSW-C Clinical Social Worker [place]  [place] Phone: [phone] Fax: [phone]

## 2016-06-22 NOTE — Progress Notes (Signed)
  Radiation Oncology         [phone] ________________________________  Name: Vincent Strickland MRN: [id number]  Date: 06/22/2016  DOB: November 05, 1965  Weekly Radiation Therapy Management    ICD-9-CM ICD-10-CM   1. 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 . . . . . . . The patient presents for routine under treatment assessment.                                  Vincent Strickland has completed 25 fractions to his rectum.  He reports having mild joint pain today and also continues to have some neuropathy in his lower legs and fingers.  He continues to take Xeloda and will take his last dose tonight.  He denies having any diarrhea and is taking 2/3rds of a dose of Miralax twice a day.  He reports having mild nausea and is taking Zofran once a day.  He has stopped taking Decadron.  He said he is having some burning in his rectal area and is using silvadene as needed.  He reports having fatigue.                                 Set-up films were reviewed.                                 The chart was checked. Physical Findings. . .  height is 6\' 1"  (1.854 m) and weight is 255 lb 9.6 oz (115.9 kg). His oral temperature is 98.6 F (37 C). His blood pressure is 128/94 (abnormal) and his pulse is 79. His oxygen saturation is 98%. . Weight essentially stable.  Lungs are clear to auscultation bilaterally. Heart has regular rate and rhythm. No palpable cervical, supraclavicular, or axillary adenopathy. Abdomen soft, non-tender, normal bowel sounds. Impression . . . . . . . The patient is tolerating radiation. Plan . . . . . . . . . . . . Continue treatment as planned.  _______________________________   Blair Promise, PhD, MD

## 2016-06-23 ENCOUNTER — Ambulatory Visit
Admission: RE | Admit: 2016-06-23 | Discharge: 2016-06-23 | Disposition: A | Discharge status: Home / Self Care | Payer: BLUE CROSS/BLUE SHIELD | Source: Ambulatory Visit | Attending: Radiation Oncology | Admitting: Radiation Oncology

## 2016-06-23 DIAGNOSIS — Z510 Encounter for antineoplastic radiation therapy: Secondary | ICD-10-CM | POA: Diagnosis not present

## 2016-06-24 ENCOUNTER — Encounter: Payer: Self-pay | Admitting: Radiation Oncology

## 2016-06-24 ENCOUNTER — Ambulatory Visit
Admission: RE | Admit: 2016-06-24 | Discharge: 2016-06-24 | Disposition: A | Discharge status: Home / Self Care | Payer: BLUE CROSS/BLUE SHIELD | Source: Ambulatory Visit | Attending: Radiation Oncology | Admitting: Radiation Oncology

## 2016-06-24 VITALS — BP 134/104 | HR 90 | Temp 98.4°F | Ht 73.0 in | Wt 254.4 lb

## 2016-06-24 DIAGNOSIS — Z510 Encounter for antineoplastic radiation therapy: Secondary | ICD-10-CM | POA: Diagnosis not present

## 2016-06-24 DIAGNOSIS — C772 Secondary and unspecified malignant neoplasm of intra-abdominal lymph nodes: Principal | ICD-10-CM

## 2016-06-24 DIAGNOSIS — C187 Malignant neoplasm of sigmoid colon: Secondary | ICD-10-CM

## 2016-06-24 NOTE — Progress Notes (Signed)
  Radiation Oncology         [id number] ________________________________  Name: Vincent Strickland MRN: [id number]  Date: 06/24/2016  DOB: 08-Mar-1965  Weekly Radiation Therapy Management    ICD-9-CM ICD-10-CM   1. 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 . . . . . . . The patient presents for routine under treatment assessment.                                  Vincent Strickland has completed 27 fractions to his rectum. He denies pain. He reports neuropathy to his hands and feet. He reports diarrhea this morning that he is managing with Imodium. He finished taking Xeloda on Tuesday. He reports some nausea and is taking Zofran. He denies new skin irritation to the treatment area and is taking Silvadene as needed. He reports fatigue.                                 Set-up films were reviewed.                                 The chart was checked. Physical Findings. . .  height is 6\' 1"  (1.854 m) and weight is 254 lb 6.4 oz (115.4 kg). His oral temperature is 98.4 F (36.9 C). His blood pressure is 134/104 (abnormal) and his pulse is 90. His oxygen saturation is 99%. . Weight essentially stable.  Lungs are clear to auscultation bilaterally. Heart has regular rate and rhythm. No palpable cervical, supraclavicular, or axillary adenopathy. Abdomen soft, non-tender, normal bowel sounds. Impression . . . . . . . The patient is tolerating radiation. Plan . . . . . . . . . . . . Continue treatment as planned. The patient will finish treatment tomorrow. He has been given a one month follow up appointment.  _______________________________   Blair Promise, PhD, MD

## 2016-06-24 NOTE — Progress Notes (Signed)
Zane has completed 27 fractions to his rectum.  He denies having pain except for neuropathy in his hands and feet.  He reports having diarrhea that started this morning at 5:30.  He has imodium if needed.  He finished taking Xeloda on Tuesday.  He reports having some nausea and is taking Zofran.  He denies having any new skin irritation in the treatment area.  He uses silvadene as needed.  He reports having fatigue.  He has been given a one month follow up appointment.  BP (!) 134/104 (BP Location: Right Arm, Patient Position: Sitting)   Pulse 90   Temp 98.4 F (36.9 C) (Oral)   Ht 6\' 1"  (1.854 m)   Wt 254 lb 6.4 oz (115.4 kg)   SpO2 99%   BMI 33.56 kg/m    Wt Readings from Last 3 Encounters:  06/24/16 254 lb 6.4 oz (115.4 kg)  06/22/16 255 lb 9.6 oz (115.9 kg)  06/15/16 257 lb 3.2 oz (116.7 kg)

## 2016-06-25 ENCOUNTER — Ambulatory Visit (HOSPITAL_BASED_OUTPATIENT_CLINIC_OR_DEPARTMENT_OTHER): Payer: BLUE CROSS/BLUE SHIELD

## 2016-06-25 ENCOUNTER — Ambulatory Visit
Admission: RE | Admit: 2016-06-25 | Discharge: 2016-06-25 | Disposition: A | Discharge status: Home / Self Care | Payer: BLUE CROSS/BLUE SHIELD | Source: Ambulatory Visit | Attending: Radiation Oncology | Admitting: Radiation Oncology

## 2016-06-25 ENCOUNTER — Telehealth: Payer: Self-pay | Admitting: *Deleted

## 2016-06-25 ENCOUNTER — Ambulatory Visit (HOSPITAL_BASED_OUTPATIENT_CLINIC_OR_DEPARTMENT_OTHER): Payer: BLUE CROSS/BLUE SHIELD | Admitting: Hematology & Oncology

## 2016-06-25 ENCOUNTER — Other Ambulatory Visit (HOSPITAL_BASED_OUTPATIENT_CLINIC_OR_DEPARTMENT_OTHER): Payer: BLUE CROSS/BLUE SHIELD

## 2016-06-25 VITALS — BP 134/88 | HR 109 | Temp 98.0°F | Resp 18

## 2016-06-25 VITALS — Wt 258.0 lb

## 2016-06-25 DIAGNOSIS — Z510 Encounter for antineoplastic radiation therapy: Secondary | ICD-10-CM | POA: Diagnosis not present

## 2016-06-25 DIAGNOSIS — Z85038 Personal history of other malignant neoplasm of large intestine: Secondary | ICD-10-CM

## 2016-06-25 DIAGNOSIS — E876 Hypokalemia: Secondary | ICD-10-CM

## 2016-06-25 DIAGNOSIS — C187 Malignant neoplasm of sigmoid colon: Secondary | ICD-10-CM | POA: Diagnosis not present

## 2016-06-25 DIAGNOSIS — C779 Secondary and unspecified malignant neoplasm of lymph node, unspecified: Secondary | ICD-10-CM | POA: Diagnosis not present

## 2016-06-25 LAB — CMP (CANCER CENTER ONLY)
ALBUMIN: 3.8 g/dL (ref 3.3–5.5)
ALT(SGPT): 52 U/L — ABNORMAL HIGH (ref 10–47)
AST: 38 U/L (ref 11–38)
Alkaline Phosphatase: 58 U/L (ref 26–84)
BUN, Bld: 9 mg/dL (ref 7–22)
CHLORIDE: 104 meq/L (ref 98–108)
CO2: 28 meq/L (ref 18–33)
CREATININE: 0.9 mg/dL (ref 0.6–1.2)
Calcium: 8.9 mg/dL (ref 8.0–10.3)
Glucose, Bld: 124 mg/dL — ABNORMAL HIGH (ref 73–118)
Potassium: 2.9 mEq/L — CL (ref 3.3–4.7)
SODIUM: 138 meq/L (ref 128–145)
Total Bilirubin: 0.9 mg/dl (ref 0.20–1.60)
Total Protein: 6.9 g/dL (ref 6.4–8.1)

## 2016-06-25 LAB — CBC WITH DIFFERENTIAL (CANCER CENTER ONLY)
BASO#: 0 10*3/uL (ref 0.0–0.2)
BASO%: 0.5 % (ref 0.0–2.0)
EOS%: 2 % (ref 0.0–7.0)
Eosinophils Absolute: 0.1 10*3/uL (ref 0.0–0.5)
HCT: 36 % — ABNORMAL LOW (ref 38.7–49.9)
HGB: 12.7 g/dL — ABNORMAL LOW (ref 13.0–17.1)
LYMPH#: 0.5 10*3/uL — ABNORMAL LOW (ref 0.9–3.3)
LYMPH%: 11.6 % — AB (ref 14.0–48.0)
MCH: 30.9 pg (ref 28.0–33.4)
MCHC: 35.3 g/dL (ref 32.0–35.9)
MCV: 88 fL (ref 82–98)
MONO#: 0.5 10*3/uL (ref 0.1–0.9)
MONO%: 11.8 % (ref 0.0–13.0)
NEUT#: 2.9 10*3/uL (ref 1.5–6.5)
NEUT%: 74.1 % (ref 40.0–80.0)
PLATELETS: 161 10*3/uL (ref 145–400)
RBC: 4.11 10*6/uL — ABNORMAL LOW (ref 4.20–5.70)
RDW: 23.1 % — ABNORMAL HIGH (ref 11.1–15.7)
WBC: 4 10*3/uL (ref 4.0–10.0)

## 2016-06-25 MED ORDER — HEPARIN SOD (PORK) LOCK FLUSH 100 UNIT/ML IV SOLN
500.0000 [IU] | Freq: Once | INTRAVENOUS | Status: AC
  Administered 2016-06-25: 500 [IU] via INTRAVENOUS
  Filled 2016-06-25: qty 5

## 2016-06-25 MED ORDER — SODIUM CHLORIDE 0.9% FLUSH
10.0000 mL | INTRAVENOUS | Status: DC | PRN
  Administered 2016-06-25: 10 mL via INTRAVENOUS
  Filled 2016-06-25: qty 10

## 2016-06-25 NOTE — Telephone Encounter (Signed)
Critical Value Potassium 2.9 Dr Ennever notified. No orders at this time 

## 2016-06-25 NOTE — Patient Instructions (Signed)
Implanted Port Insertion, Care After °This sheet gives you information about how to care for yourself after your procedure. Your health care provider may also give you more specific instructions. If you have problems or questions, contact your health care provider. °What can I expect after the procedure? °After your procedure, it is common to have: °· Discomfort at the port insertion site. °· Bruising on the skin over the port. This should improve over 3-4 days. ° °Follow these instructions at home: °Port care °· After your port is placed, you will get a manufacturer's information card. The card has information about your port. Keep this card with you at all times. °· Take care of the port as told by your health care provider. Ask your health care provider if you or a family member can get training for taking care of the port at home. A home health care nurse may also take care of the port. °· Make sure to remember what type of port you have. °Incision care °· Follow instructions from your health care provider about how to take care of your port insertion site. Make sure you: °? Wash your hands with soap and water before you change your bandage (dressing). If soap and water are not available, use hand sanitizer. °? Change your dressing as told by your health care provider. °? Leave stitches (sutures), skin glue, or adhesive strips in place. These skin closures may need to stay in place for 2 weeks or longer. If adhesive strip edges start to loosen and curl up, you may trim the loose edges. Do not remove adhesive strips completely unless your health care provider tells you to do that. °· Check your port insertion site every day for signs of infection. Check for: °? More redness, swelling, or pain. °? More fluid or blood. °? Warmth. °? Pus or a bad smell. °General instructions °· Do not take baths, swim, or use a hot tub until your health care provider approves. °· Do not lift anything that is heavier than 10 lb (4.5  kg) for a week, or as told by your health care provider. °· Ask your health care provider when it is okay to: °? Return to work or school. °? Resume usual physical activities or sports. °· Do not drive for 24 hours if you were given a medicine to help you relax (sedative). °· Take over-the-counter and prescription medicines only as told by your health care provider. °· Wear a medical alert bracelet in case of an emergency. This will tell any health care providers that you have a port. °· Keep all follow-up visits as told by your health care provider. This is important. °Contact a health care provider if: °· You cannot flush your port with saline as directed, or you cannot draw blood from the port. °· You have a fever or chills. °· You have more redness, swelling, or pain around your port insertion site. °· You have more fluid or blood coming from your port insertion site. °· Your port insertion site feels warm to the touch. °· You have pus or a bad smell coming from the port insertion site. °Get help right away if: °· You have chest pain or shortness of breath. °· You have bleeding from your port that you cannot control. °Summary °· Take care of the port as told by your health care provider. °· Change your dressing as told by your health care provider. °· Keep all follow-up visits as told by your health care provider. °  This information is not intended to replace advice given to you by your health care provider. Make sure you discuss any questions you have with your health care provider. °Document Released: 11/01/2012 Document Revised: 12/03/2015 Document Reviewed: 12/03/2015 °Elsevier Interactive Patient Education © 2017 [employer] Inc[employer] ° °

## 2016-06-25 NOTE — Progress Notes (Signed)
Hematology and Oncology Follow Up Visit  Vincent Strickland [id number] 1965-12-14 51 y.o. 06/25/2016   Principle Diagnosis:  Stage IIIC ([id number]) adenocarcinoma of the upper rectum  Current Therapy:   FOLFOX s/p cycle #7 - completed on 04/21/2016 Radiation with Xeloda - started on 05/10/2016 FOLFIRI - start cycle 1/4 on 07/14/2016    Interim History:  Vincent Strickland is here today with his wife for follow-up. He completed his radiation therapy today. I'm so happy for him. He finished the low-dose Xeloda a couple days ago.  He tolerated treatment incredibly well. He really did not have much in way of side effects.  He is eating well. He's had no mouth sores.  He has had the neuropathy. This is about the same.  He's had no bleeding. He's had no fever.  Not surprisingly, he is still working full-time.  Overall, his performance status is ECOG 1.  Medications:  Allergies as of 06/25/2016      Reactions   Niaspan [niacin]    Severe flushing   Percocet [oxycodone-acetaminophen] Nausea Only   hallucinations      Medication List       Accurate as of 06/25/16  5:00 PM. Always use your most recent med list.          acetaminophen 500 MG tablet Commonly known as:  TYLENOL Take 1,000 mg by mouth every 6 (six) hours as needed (for pain.).   acetaminophen-codeine 300-30 MG tablet Commonly known as:  TYLENOL #3 Take 1-2 tablets by mouth every 4 (four) hours as needed for moderate pain.   capecitabine 500 MG tablet Commonly known as:  XELODA TAKE 4 TABLETS BY MOUTH TWICE DAILY WITH FOOD MONDAY THROUGH FRIDAY   capecitabine 500 MG tablet Commonly known as:  XELODA TAKE 4 TABLETS BY MOUTH TWICE DAILY WITH FOOD MONDAY THROUGH FRIDAY   dexamethasone 4 MG tablet Commonly known as:  DECADRON TAKE 2 TABLETS BY MOUTH EVERY DAY **START THE DAY AFTER CHEMOTHERAPY FOR 2 DAYS. TAKE WITH FOOD   gabapentin 300 MG capsule Commonly known as:  NEURONTIN Take 2 capsules (600 mg total) by mouth at  bedtime.   HYDROcodone-acetaminophen 5-325 MG tablet Commonly known as:  NORCO Take 1 tablet by mouth every 4 (four) hours as needed for moderate pain.   losartan-hydrochlorothiazide 100-25 MG tablet Commonly known as:  HYZAAR Take 1 tablet by mouth daily.   polyethylene glycol packet Commonly known as:  MIRALAX / GLYCOLAX Take 17 g by mouth 2 (two) times daily.   potassium chloride SA 20 MEQ tablet Commonly known as:  K-DUR,KLOR-CON Take 2 tablets (40 mEq total) by mouth 2 (two) times daily.   saccharomyces boulardii 250 MG capsule Commonly known as:  FLORASTOR Take 1 capsule (250 mg total) by mouth 2 (two) times daily.   silver sulfADIAZINE 1 % cream Commonly known as:  SILVADENE Apply 1 application topically daily. Apply to affected area twice a day       Allergies:  Allergies  Allergen Reactions  . Niaspan [Niacin]     Severe flushing  . Percocet [Oxycodone-Acetaminophen] Nausea Only    hallucinations    Past Medical History, Surgical history, Social history, and Family History were reviewed and updated.  Review of Systems: All other 10 point review of systems is negative.   Physical Exam:  weight is 258 lb (117 kg).   Wt Readings from Last 3 Encounters:  06/25/16 258 lb (117 kg)  06/24/16 254 lb 6.4 oz (115.4 kg)  06/22/16 255 lb  9.6 oz (115.9 kg)    Head and neck exam shows no ocular or oral lesions. He has no scleral icterus. There is no conjunctival pallor. There is no adenopathy in the neck. Lungs are clear bilaterally. Cardiac exam regular rate and rhythm with no murmurs, rubs or bruits. Abdomen is soft. His laparoscopy scar is well-healed. He has no fluid wave. There is no guarding or rebound tenderness. Maybe some slight tenderness in the left lower quadrant. He has no palpable liver or spleen tip. Back exam shows no tenderness over the spine, ribs or hips. Extremities shows no clubbing, cyanosis or edema. He has good range of motion of his joints. Skin  exam shows no rashes, ecchymosis or petechia. He does have several hyperpigmented lesions which do not appear to be suspicious. Neurological exam shows no focal neurological deficits.   Lab Results  Component Value Date   WBC 4.0 06/25/2016   HGB 12.7 (L) 06/25/2016   HCT 36.0 (L) 06/25/2016   MCV 88 06/25/2016   PLT 161 06/25/2016   Lab Results  Component Value Date   FERRITIN 34 01/13/2016   IRON 41 (L) 01/13/2016   TIBC 369 01/13/2016   UIBC 328 01/13/2016   IRONPCTSAT 11 (L) 01/13/2016   Lab Results  Component Value Date   RBC 4.11 (L) 06/25/2016   No results found for: KPAFRELGTCHN, LAMBDASER, KAPLAMBRATIO No results found for: IGGSERUM, IGA, IGMSERUM No results found for: Odetta Pink, SPEI   Chemistry      Component Value Date/Time   NA 138 06/25/2016 1413   K 2.9 (LL) 06/25/2016 1413   CL 104 06/25/2016 1413   CO2 28 06/25/2016 1413   BUN 9 06/25/2016 1413   CREATININE 0.9 06/25/2016 1413      Component Value Date/Time   CALCIUM 8.9 06/25/2016 1413   ALKPHOS 58 06/25/2016 1413   AST 38 06/25/2016 1413   ALT 52 (H) 06/25/2016 1413   BILITOT 0.90 06/25/2016 1413     Impression and Plan: Vincent Strickland is a pleasant 51 yo gentleman with 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   I'm so glad that he has completed the radiation therapy with Xeloda.  We now will give him a 3 week break and then start him on the final phase of adjuvant therapy. With his neuropathy, I do not want to use oxaliplatin. I will go ahead and use FOLFIRI. I will go with 4 cycles. I think this will definitely be adequate for his adjuvant therapy.  Again, I just want to be aggressive. I think that we have put him through very aggressive adjuvant therapy. I have to keep remembering that he had the 5 positive lymph nodes. This, I just cannot "gloss over."  I talked to he and his wife about the change in adjuvant  systemic therapy. They understand the reason why. I explained the side effects. We will have to watch out for diarrhea. We will have to watch out for lower blood counts. I probably will cut his dose back little bit just so that he can do okay with side effects.   I am also thankful that the family situation is doing a low bit better. They are undergoing counseling.  We will have him start on June 20.  I will see him back on July 5 for his second cycle of FOLFIRI. Marland Kitchen     [name], MD 6/1/20185:00 PM

## 2016-06-28 LAB — LACTATE DEHYDROGENASE: LDH: 269 U/L — ABNORMAL HIGH (ref 125–245)

## 2016-06-28 LAB — CEA (IN HOUSE-CHCC): CEA (CHCC-In House): 1.95 ng/mL (ref 0.00–5.00)

## 2016-06-29 ENCOUNTER — Telehealth: Payer: Self-pay | Admitting: *Deleted

## 2016-06-29 NOTE — Telephone Encounter (Addendum)
Patient is aware of results   ----- Message from [name], MD sent at 06/28/2016  2:27 PM EDT ----- Call - CEA tumor marker is only 1.95!!!  Great job!!! pete

## 2016-07-01 ENCOUNTER — Encounter: Payer: Self-pay | Admitting: Radiation Oncology

## 2016-07-01 NOTE — Progress Notes (Signed)
  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 Patient reported diarrhea that he managed with Imodium. He denied skin irritation or pain to the treatment area.  Plan: The patient has completed radiation treatment. The patient will return to radiation oncology clinic for routine followup in one month. I advised them to call or return sooner if they have any questions or concerns related to their recovery or treatment.  -----------------------------------  Blair Promise, PhD, MD  This document serves as a record of services personally performed by Gery Pray, MD. It was created on his behalf by Bethann Humble, a trained medical scribe. The creation of this record is based on the scribe's personal observations and the provider's statements to them. This document has been checked and approved by the attending provider.

## 2016-07-08 ENCOUNTER — Encounter: Payer: Self-pay | Admitting: *Deleted

## 2016-07-08 NOTE — Progress Notes (Signed)
  [name] Social Work  CSW provided supportive counseling session with pt and wife, Vincent Strickland. Pt continues coping with complex family dynamics on top of cancer treatment. CSW provided education to pt and wife about cancer's impact on the family system and role of CSW/Pt and Family Support Team, support groups and other resources to assist. Pt and wife shared positive communication and we explored strengths and family challenges. Pt and family are moving forward and have an appt scheduled for family counseling in near future. CSW discussed options to support wife and CSW explained that counseling intern through [facility] is an option, but this CSW has been providing counseling to pt. CSW educated pt and wife that the most helpful situation would be for them to have a Editor, commissioning and then each have their own option for counseling support separately. They are open to this and CSW gave [name]' info for additional counseling resource. They also plan to explore GI group, [facility] and other cancer center supports. CSW to follow and assist.   Clinical Social Work interventions: Supportive counseling  [name], LCSW, OSW-C Clinical Social Worker [place]  [place] Phone: [phone] Fax: [phone]

## 2016-07-14 ENCOUNTER — Ambulatory Visit (HOSPITAL_BASED_OUTPATIENT_CLINIC_OR_DEPARTMENT_OTHER): Payer: BLUE CROSS/BLUE SHIELD

## 2016-07-14 ENCOUNTER — Other Ambulatory Visit (HOSPITAL_BASED_OUTPATIENT_CLINIC_OR_DEPARTMENT_OTHER): Payer: BLUE CROSS/BLUE SHIELD

## 2016-07-14 ENCOUNTER — Ambulatory Visit: Payer: BLUE CROSS/BLUE SHIELD

## 2016-07-14 VITALS — BP 147/99 | HR 82 | Temp 97.7°F | Resp 18

## 2016-07-14 DIAGNOSIS — Z5111 Encounter for antineoplastic chemotherapy: Secondary | ICD-10-CM | POA: Diagnosis not present

## 2016-07-14 DIAGNOSIS — C772 Secondary and unspecified malignant neoplasm of intra-abdominal lymph nodes: Principal | ICD-10-CM

## 2016-07-14 DIAGNOSIS — C187 Malignant neoplasm of sigmoid colon: Secondary | ICD-10-CM

## 2016-07-14 DIAGNOSIS — Z85038 Personal history of other malignant neoplasm of large intestine: Secondary | ICD-10-CM

## 2016-07-14 DIAGNOSIS — C779 Secondary and unspecified malignant neoplasm of lymph node, unspecified: Secondary | ICD-10-CM | POA: Diagnosis not present

## 2016-07-14 LAB — CMP (CANCER CENTER ONLY)
ALBUMIN: 3.8 g/dL (ref 3.3–5.5)
ALT(SGPT): 55 U/L — ABNORMAL HIGH (ref 10–47)
AST: 39 U/L — AB (ref 11–38)
Alkaline Phosphatase: 72 U/L (ref 26–84)
BILIRUBIN TOTAL: 1 mg/dL (ref 0.20–1.60)
BUN, Bld: 14 mg/dL (ref 7–22)
CALCIUM: 9.5 mg/dL (ref 8.0–10.3)
CO2: 27 meq/L (ref 18–33)
CREATININE: 0.9 mg/dL (ref 0.6–1.2)
Chloride: 109 mEq/L — ABNORMAL HIGH (ref 98–108)
Glucose, Bld: 134 mg/dL — ABNORMAL HIGH (ref 73–118)
Potassium: 3.4 mEq/L (ref 3.3–4.7)
SODIUM: 145 meq/L (ref 128–145)
TOTAL PROTEIN: 7 g/dL (ref 6.4–8.1)

## 2016-07-14 LAB — CBC WITH DIFFERENTIAL (CANCER CENTER ONLY)
BASO#: 0 10*3/uL (ref 0.0–0.2)
BASO%: 1 % (ref 0.0–2.0)
EOS ABS: 0.1 10*3/uL (ref 0.0–0.5)
EOS%: 1.7 % (ref 0.0–7.0)
HEMATOCRIT: 37.2 % — AB (ref 38.7–49.9)
HEMOGLOBIN: 12.8 g/dL — AB (ref 13.0–17.1)
LYMPH#: 0.6 10*3/uL — AB (ref 0.9–3.3)
LYMPH%: 14 % (ref 14.0–48.0)
MCH: 30.7 pg (ref 28.0–33.4)
MCHC: 34.4 g/dL (ref 32.0–35.9)
MCV: 89 fL (ref 82–98)
MONO#: 0.5 10*3/uL (ref 0.1–0.9)
MONO%: 11.4 % (ref 0.0–13.0)
NEUT#: 3 10*3/uL (ref 1.5–6.5)
NEUT%: 71.9 % (ref 40.0–80.0)
Platelets: 181 10*3/uL (ref 145–400)
RBC: 4.17 10*6/uL — AB (ref 4.20–5.70)
RDW: 21.3 % — ABNORMAL HIGH (ref 11.1–15.7)
WBC: 4.1 10*3/uL (ref 4.0–10.0)

## 2016-07-14 LAB — CEA (IN HOUSE-CHCC): CEA (CHCC-IN HOUSE): 1.31 ng/mL (ref 0.00–5.00)

## 2016-07-14 MED ORDER — PALONOSETRON HCL INJECTION 0.25 MG/5ML
INTRAVENOUS | Status: AC
  Filled 2016-07-14: qty 5

## 2016-07-14 MED ORDER — IRINOTECAN HCL CHEMO INJECTION 100 MG/5ML
144.0000 mg/m2 | Freq: Once | INTRAVENOUS | Status: AC
  Administered 2016-07-14: 360 mg via INTRAVENOUS
  Filled 2016-07-14: qty 15

## 2016-07-14 MED ORDER — FLUOROURACIL CHEMO INJECTION 2.5 GM/50ML
400.0000 mg/m2 | Freq: Once | INTRAVENOUS | Status: AC
  Administered 2016-07-14: 1000 mg via INTRAVENOUS
  Filled 2016-07-14: qty 20

## 2016-07-14 MED ORDER — SODIUM CHLORIDE 0.9 % IV SOLN
Freq: Once | INTRAVENOUS | Status: AC
  Administered 2016-07-14: 10:00:00 via INTRAVENOUS

## 2016-07-14 MED ORDER — FLUOROURACIL CHEMO INJECTION 5 GM/100ML
2160.0000 mg/m2 | INTRAVENOUS | Status: DC
  Administered 2016-07-14: 5300 mg via INTRAVENOUS
  Filled 2016-07-14: qty 106

## 2016-07-14 MED ORDER — PALONOSETRON HCL INJECTION 0.25 MG/5ML
0.2500 mg | Freq: Once | INTRAVENOUS | Status: AC
  Administered 2016-07-14: 0.25 mg via INTRAVENOUS

## 2016-07-14 MED ORDER — ATROPINE SULFATE 1 MG/ML IJ SOLN: 0.5000 mg | Freq: Once | INTRAMUSCULAR | Status: DC | PRN

## 2016-07-14 MED ORDER — ATROPINE SULFATE 1 MG/ML IJ SOLN
INTRAMUSCULAR | Status: AC
  Filled 2016-07-14: qty 1

## 2016-07-14 MED ORDER — DEXTROSE 5 % IV SOLN
400.0000 mg/m2 | Freq: Once | INTRAVENOUS | Status: AC
  Administered 2016-07-14: 980 mg via INTRAVENOUS
  Filled 2016-07-14: qty 49

## 2016-07-14 MED ORDER — DEXAMETHASONE SODIUM PHOSPHATE 10 MG/ML IJ SOLN
10.0000 mg | Freq: Once | INTRAMUSCULAR | Status: AC
  Administered 2016-07-14: 10 mg via INTRAVENOUS

## 2016-07-14 MED ORDER — DEXAMETHASONE SODIUM PHOSPHATE 10 MG/ML IJ SOLN
INTRAMUSCULAR | Status: AC
  Filled 2016-07-14: qty 1

## 2016-07-14 NOTE — Patient Instructions (Signed)
[facility] Discharge Instructions for Patients Receiving Chemotherapy  Today you received the following chemotherapy agents Leucovorin, Irinotecan and 5FU  To help prevent nausea and vomiting after your treatment, we encourage you to take your nausea medication as prescribed by MD.   If you develop nausea and vomiting that is not controlled by your nausea medication, call the clinic.   BELOW ARE SYMPTOMS THAT SHOULD BE REPORTED IMMEDIATELY:  *FEVER GREATER THAN 100.5 F  *CHILLS WITH OR WITHOUT FEVER  NAUSEA AND VOMITING THAT IS NOT CONTROLLED WITH YOUR NAUSEA MEDICATION  *UNUSUAL SHORTNESS OF BREATH  *UNUSUAL BRUISING OR BLEEDING  TENDERNESS IN MOUTH AND THROAT WITH OR WITHOUT PRESENCE OF ULCERS  *URINARY PROBLEMS  *BOWEL PROBLEMS  UNUSUAL RASH Items with * indicate a potential emergency and should be followed up as soon as possible.  Feel free to call the clinic you have any questions or concerns. The clinic phone number is [phone].  Please show the [place] at check-in to the Emergency Department and triage nurse.  Irinotecan injection What is this medicine? IRINOTECAN (ir in oh TEE kan ) is a chemotherapy drug. It is used to treat colon and rectal cancer. This medicine may be used for other purposes; ask your health care provider or pharmacist if you have questions. COMMON BRAND NAME(S): Camptosar What should I tell my health care provider before I take this medicine? They need to know if you have any of these conditions: -blood disorders -dehydration -diarrhea -infection (especially a virus infection such as chickenpox, cold sores, or herpes) -liver disease -low blood counts, like low white cell, platelet, or red cell counts -recent or ongoing radiation therapy -an unusual or allergic reaction to irinotecan, sorbitol, other chemotherapy, other medicines, foods, dyes, or preservatives -pregnant or trying to get  pregnant -breast-feeding How should I use this medicine? This drug is given as an infusion into a vein. It is administered in a hospital or clinic by a specially trained health care professional. Talk to your pediatrician regarding the use of this medicine in children. Special care may be needed. Overdosage: If you think you have taken too much of this medicine contact a poison control center or emergency room at once. NOTE: This medicine is only for you. Do not share this medicine with others. What if I miss a dose? It is important not to miss your dose. Call your doctor or health care professional if you are unable to keep an appointment. What may interact with this medicine? Do not take this medicine with any of the following medications: -atazanavir -certain medicines for fungal infections like itraconazole and ketoconazole -St. John's Wort This medicine may also interact with the following medications: -dexamethasone -diuretics -laxatives -medicines for seizures like carbamazepine, mephobarbital, phenobarbital, phenytoin, primidone -medicines to increase blood counts like filgrastim, pegfilgrastim, sargramostim -prochlorperazine -vaccines This list may not describe all possible interactions. Give your health care provider a list of all the medicines, herbs, non-prescription drugs, or dietary supplements you use. Also tell them if you smoke, drink alcohol, or use illegal drugs. Some items may interact with your medicine. What should I watch for while using this medicine? Your condition will be monitored carefully while you are receiving this medicine. You will need important blood work done while you are taking this medicine. This drug may make you feel generally unwell. This is not uncommon, as chemotherapy can affect healthy cells as well as cancer cells. Report any side effects. Continue your course of treatment  even though you feel ill unless your doctor tells you to stop. In some  cases, you may be given additional medicines to help with side effects. Follow all directions for their use. You may get drowsy or dizzy. Do not drive, use machinery, or do anything that needs mental alertness until you know how this medicine affects you. Do not stand or sit up quickly, especially if you are an older patient. This reduces the risk of dizzy or fainting spells. Call your doctor or health care professional for advice if you get a fever, chills or sore throat, or other symptoms of a cold or flu. Do not treat yourself. This drug decreases your body's ability to fight infections. Try to avoid being around people who are sick. This medicine may increase your risk to bruise or bleed. Call your doctor or health care professional if you notice any unusual bleeding. Be careful brushing and flossing your teeth or using a toothpick because you may get an infection or bleed more easily. If you have any dental work done, tell your dentist you are receiving this medicine. Avoid taking products that contain aspirin, acetaminophen, ibuprofen, naproxen, or ketoprofen unless instructed by your doctor. These medicines may hide a fever. Do not become pregnant while taking this medicine. Women should inform their doctor if they wish to become pregnant or think they might be pregnant. There is a potential for serious side effects to an unborn child. Talk to your health care professional or pharmacist for more information. Do not breast-feed an infant while taking this medicine. What side effects may I notice from receiving this medicine? Side effects that you should report to your doctor or health care professional as soon as possible: -allergic reactions like skin rash, itching or hives, swelling of the face, lips, or tongue -low blood counts - this medicine may decrease the number of white blood cells, red blood cells and platelets. You may be at increased risk for infections and bleeding. -signs of infection  - fever or chills, cough, sore throat, pain or difficulty passing urine -signs of decreased platelets or bleeding - bruising, pinpoint red spots on the skin, black, tarry stools, blood in the urine -signs of decreased red blood cells - unusually weak or tired, fainting spells, lightheadedness -breathing problems -chest pain -diarrhea -feeling faint or lightheaded, falls -flushing, runny nose, sweating during infusion -mouth sores or pain -pain, swelling, redness or irritation where injected -pain, swelling, warmth in the leg -pain, tingling, numbness in the hands or feet -problems with balance, talking, walking -stomach cramps, pain -trouble passing urine or change in the amount of urine -vomiting as to be unable to hold down drinks or food -yellowing of the eyes or skin Side effects that usually do not require medical attention (report to your doctor or health care professional if they continue or are bothersome): -constipation -hair loss -headache -loss of appetite -nausea, vomiting -stomach upset This list may not describe all possible side effects. Call your doctor for medical advice about side effects. You may report side effects to FDA at 1-800-FDA-1088. Where should I keep my medicine? This drug is given in a hospital or clinic and will not be stored at home. NOTE: This sheet is a summary. It may not cover all possible information. If you have questions about this medicine, talk to your doctor, pharmacist, or health care provider.  2018 Elsevier/Gold Standard (2012-07-10 16:29:32)

## 2016-07-14 NOTE — Patient Instructions (Signed)
Implanted Port Home Guide An implanted port is a type of central line that is placed under the skin. Central lines are used to provide IV access when treatment or nutrition needs to be given through a person's veins. Implanted ports are used for long-term IV access. An implanted port may be placed because:  You need IV medicine that would be irritating to the small veins in your hands or arms.  You need long-term IV medicines, such as antibiotics.  You need IV nutrition for a long period.  You need frequent blood draws for lab tests.  You need dialysis.  Implanted ports are usually placed in the chest area, but they can also be placed in the upper arm, the abdomen, or the leg. An implanted port has two main parts:  Reservoir. The reservoir is round and will appear as a small, raised area under your skin. The reservoir is the part where a needle is inserted to give medicines or draw blood.  Catheter. The catheter is a thin, flexible tube that extends from the reservoir. The catheter is placed into a large vein. Medicine that is inserted into the reservoir goes into the catheter and then into the vein.  How will I care for my incision site? Do not get the incision site wet. Bathe or shower as directed by your health care provider. How is my port accessed? Special steps must be taken to access the port:  Before the port is accessed, a numbing cream can be placed on the skin. This helps numb the skin over the port site.  Your health care provider uses a sterile technique to access the port. ? Your health care provider must put on a mask and sterile gloves. ? The skin over your port is cleaned carefully with an antiseptic and allowed to dry. ? The port is gently pinched between sterile gloves, and a needle is inserted into the port.  Only "non-coring" port needles should be used to access the port. Once the port is accessed, a blood return should be checked. This helps ensure that the port  is in the vein and is not clogged.  If your port needs to remain accessed for a constant infusion, a clear (transparent) bandage will be placed over the needle site. The bandage and needle will need to be changed every week, or as directed by your health care provider.  Keep the bandage covering the needle clean and dry. Do not get it wet. Follow your health care provider's instructions on how to take a shower or bath while the port is accessed.  If your port does not need to stay accessed, no bandage is needed over the port.  What is flushing? Flushing helps keep the port from getting clogged. Follow your health care provider's instructions on how and when to flush the port. Ports are usually flushed with saline solution or a medicine called heparin. The need for flushing will depend on how the port is used.  If the port is used for intermittent medicines or blood draws, the port will need to be flushed: ? After medicines have been given. ? After blood has been drawn. ? As part of routine maintenance.  If a constant infusion is running, the port may not need to be flushed.  How long will my port stay implanted? The port can stay in for as long as your health care provider thinks it is needed. When it is time for the port to come out, surgery will be   done to remove it. The procedure is similar to the one performed when the port was put in. When should I seek immediate medical care? When you have an implanted port, you should seek immediate medical care if:  You notice a bad smell coming from the incision site.  You have swelling, redness, or drainage at the incision site.  You have more swelling or pain at the port site or the surrounding area.  You have a fever that is not controlled with medicine.  This information is not intended to replace advice given to you by your health care provider. Make sure you discuss any questions you have with your health care provider. Document  Released: 01/11/2005 Document Revised: 06/19/2015 Document Reviewed: 09/18/2012 Elsevier Interactive Patient Education  2017 Elsevier Inc.  

## 2016-07-16 ENCOUNTER — Ambulatory Visit (HOSPITAL_BASED_OUTPATIENT_CLINIC_OR_DEPARTMENT_OTHER): Payer: BLUE CROSS/BLUE SHIELD

## 2016-07-16 ENCOUNTER — Telehealth: Payer: Self-pay | Admitting: *Deleted

## 2016-07-16 VITALS — BP 133/88 | HR 77 | Temp 97.4°F

## 2016-07-16 DIAGNOSIS — C187 Malignant neoplasm of sigmoid colon: Secondary | ICD-10-CM | POA: Diagnosis not present

## 2016-07-16 DIAGNOSIS — C772 Secondary and unspecified malignant neoplasm of intra-abdominal lymph nodes: Principal | ICD-10-CM

## 2016-07-16 MED ORDER — SODIUM CHLORIDE 0.9% FLUSH
10.0000 mL | INTRAVENOUS | Status: DC | PRN
  Administered 2016-07-16: 10 mL
  Filled 2016-07-16: qty 10

## 2016-07-16 MED ORDER — HEPARIN SOD (PORK) LOCK FLUSH 100 UNIT/ML IV SOLN
500.0000 [IU] | Freq: Once | INTRAVENOUS | Status: AC | PRN
  Administered 2016-07-16: 500 [IU]
  Filled 2016-07-16: qty 5

## 2016-07-16 NOTE — Telephone Encounter (Signed)
Patient is doing well after his change in regimen. He c/o slight nausea and some constipation. He is using his antiemetics and using OTC miralax. He has no questions or concerns. He knows to call the office if he has any questions or concerns.

## 2016-07-16 NOTE — Patient Instructions (Signed)
Fluorouracil, 5-FU injection What is this medicine? FLUOROURACIL, 5-FU (flure oh YOOR a sil) is a chemotherapy drug. It slows the growth of cancer cells. This medicine is used to treat many types of cancer like breast cancer, colon or rectal cancer, pancreatic cancer, and stomach cancer. This medicine may be used for other purposes; ask your health care provider or pharmacist if you have questions. COMMON BRAND NAME(S): Adrucil What should I tell my health care provider before I take this medicine? They need to know if you have any of these conditions: -blood disorders -dihydropyrimidine dehydrogenase (DPD) deficiency -infection (especially a virus infection such as chickenpox, cold sores, or herpes) -kidney disease -liver disease -malnourished, poor nutrition -recent or ongoing radiation therapy -an unusual or allergic reaction to fluorouracil, other chemotherapy, other medicines, foods, dyes, or preservatives -pregnant or trying to get pregnant -breast-feeding How should I use this medicine? This drug is given as an infusion or injection into a vein. It is administered in a hospital or clinic by a specially trained health care professional. Talk to your pediatrician regarding the use of this medicine in children. Special care may be needed. Overdosage: If you think you have taken too much of this medicine contact a poison control center or emergency room at once. NOTE: This medicine is only for you. Do not share this medicine with others. What if I miss a dose? It is important not to miss your dose. Call your doctor or health care professional if you are unable to keep an appointment. What may interact with this medicine? -allopurinol -cimetidine -dapsone -digoxin -hydroxyurea -leucovorin -levamisole -medicines for seizures like ethotoin, fosphenytoin, phenytoin -medicines to increase blood counts like filgrastim, pegfilgrastim, sargramostim -medicines that treat or prevent blood  clots like warfarin, enoxaparin, and dalteparin -methotrexate -metronidazole -pyrimethamine -some other chemotherapy drugs like busulfan, cisplatin, estramustine, vinblastine -trimethoprim -trimetrexate -vaccines Talk to your doctor or health care professional before taking any of these medicines: -acetaminophen -aspirin -ibuprofen -ketoprofen -naproxen This list may not describe all possible interactions. Give your health care provider a list of all the medicines, herbs, non-prescription drugs, or dietary supplements you use. Also tell them if you smoke, drink alcohol, or use illegal drugs. Some items may interact with your medicine. What should I watch for while using this medicine? Visit your doctor for checks on your progress. This drug may make you feel generally unwell. This is not uncommon, as chemotherapy can affect healthy cells as well as cancer cells. Report any side effects. Continue your course of treatment even though you feel ill unless your doctor tells you to stop. In some cases, you may be given additional medicines to help with side effects. Follow all directions for their use. Call your doctor or health care professional for advice if you get a fever, chills or sore throat, or other symptoms of a cold or flu. Do not treat yourself. This drug decreases your body's ability to fight infections. Try to avoid being around people who are sick. This medicine may increase your risk to bruise or bleed. Call your doctor or health care professional if you notice any unusual bleeding. Be careful brushing and flossing your teeth or using a toothpick because you may get an infection or bleed more easily. If you have any dental work done, tell your dentist you are receiving this medicine. Avoid taking products that contain aspirin, acetaminophen, ibuprofen, naproxen, or ketoprofen unless instructed by your doctor. These medicines may hide a fever. Do not become pregnant while taking this    medicine. Women should inform their doctor if they wish to become pregnant or think they might be pregnant. There is a potential for serious side effects to an unborn child. Talk to your health care professional or pharmacist for more information. Do not breast-feed an infant while taking this medicine. Men should inform their doctor if they wish to father a child. This medicine may lower sperm counts. Do not treat diarrhea with over the counter products. Contact your doctor if you have diarrhea that lasts more than 2 days or if it is severe and watery. This medicine can make you more sensitive to the sun. Keep out of the sun. If you cannot avoid being in the sun, wear protective clothing and use sunscreen. Do not use sun lamps or tanning beds/booths. What side effects may I notice from receiving this medicine? Side effects that you should report to your doctor or health care professional as soon as possible: -allergic reactions like skin rash, itching or hives, swelling of the face, lips, or tongue -low blood counts - this medicine may decrease the number of white blood cells, red blood cells and platelets. You may be at increased risk for infections and bleeding. -signs of infection - fever or chills, cough, sore throat, pain or difficulty passing urine -signs of decreased platelets or bleeding - bruising, pinpoint red spots on the skin, black, tarry stools, blood in the urine -signs of decreased red blood cells - unusually weak or tired, fainting spells, lightheadedness -breathing problems -changes in vision -chest pain -mouth sores -nausea and vomiting -pain, swelling, redness at site where injected -pain, tingling, numbness in the hands or feet -redness, swelling, or sores on hands or feet -stomach pain -unusual bleeding Side effects that usually do not require medical attention (report to your doctor or health care professional if they continue or are bothersome): -changes in finger or  toe nails -diarrhea -dry or itchy skin -hair loss -headache -loss of appetite -sensitivity of eyes to the light -stomach upset -unusually teary eyes This list may not describe all possible side effects. Call your doctor for medical advice about side effects. You may report side effects to FDA at 1-800-FDA-1088. Where should I keep my medicine? This drug is given in a hospital or clinic and will not be stored at home. NOTE: This sheet is a summary. It may not cover all possible information. If you have questions about this medicine, talk to your doctor, pharmacist, or health care provider.  2018 Elsevier/Gold Standard (2007-05-17 13:53:16)  

## 2016-07-20 ENCOUNTER — Ambulatory Visit (INDEPENDENT_AMBULATORY_CARE_PROVIDER_SITE_OTHER): Payer: BLUE CROSS/BLUE SHIELD | Admitting: Psychiatry

## 2016-07-20 DIAGNOSIS — F0632 Mood disorder due to known physiological condition with major depressive-like episode: Secondary | ICD-10-CM | POA: Diagnosis not present

## 2016-07-27 ENCOUNTER — Ambulatory Visit (HOSPITAL_BASED_OUTPATIENT_CLINIC_OR_DEPARTMENT_OTHER): Payer: BLUE CROSS/BLUE SHIELD

## 2016-07-27 ENCOUNTER — Encounter: Payer: Self-pay | Admitting: Oncology

## 2016-07-27 ENCOUNTER — Other Ambulatory Visit (HOSPITAL_BASED_OUTPATIENT_CLINIC_OR_DEPARTMENT_OTHER): Payer: BLUE CROSS/BLUE SHIELD

## 2016-07-27 ENCOUNTER — Ambulatory Visit (HOSPITAL_BASED_OUTPATIENT_CLINIC_OR_DEPARTMENT_OTHER): Payer: BLUE CROSS/BLUE SHIELD | Admitting: Hematology & Oncology

## 2016-07-27 ENCOUNTER — Ambulatory Visit: Payer: BLUE CROSS/BLUE SHIELD

## 2016-07-27 ENCOUNTER — Other Ambulatory Visit: Payer: Self-pay | Admitting: Family

## 2016-07-27 VITALS — BP 133/94 | HR 70 | Temp 98.6°F | Resp 20 | Wt 257.0 lb

## 2016-07-27 DIAGNOSIS — C187 Malignant neoplasm of sigmoid colon: Secondary | ICD-10-CM

## 2016-07-27 DIAGNOSIS — Z5111 Encounter for antineoplastic chemotherapy: Secondary | ICD-10-CM | POA: Diagnosis not present

## 2016-07-27 DIAGNOSIS — C772 Secondary and unspecified malignant neoplasm of intra-abdominal lymph nodes: Secondary | ICD-10-CM

## 2016-07-27 DIAGNOSIS — R110 Nausea: Secondary | ICD-10-CM

## 2016-07-27 DIAGNOSIS — C779 Secondary and unspecified malignant neoplasm of lymph node, unspecified: Secondary | ICD-10-CM

## 2016-07-27 DIAGNOSIS — Z85038 Personal history of other malignant neoplasm of large intestine: Secondary | ICD-10-CM

## 2016-07-27 LAB — CBC WITH DIFFERENTIAL (CANCER CENTER ONLY)
BASO#: 0 10*3/uL (ref 0.0–0.2)
BASO%: 0.6 % (ref 0.0–2.0)
EOS ABS: 0.1 10*3/uL (ref 0.0–0.5)
EOS%: 2 % (ref 0.0–7.0)
HCT: 36.3 % — ABNORMAL LOW (ref 38.7–49.9)
HGB: 12.8 g/dL — ABNORMAL LOW (ref 13.0–17.1)
LYMPH#: 0.4 10*3/uL — ABNORMAL LOW (ref 0.9–3.3)
LYMPH%: 11.9 % — AB (ref 14.0–48.0)
MCH: 31.1 pg (ref 28.0–33.4)
MCHC: 35.3 g/dL (ref 32.0–35.9)
MCV: 88 fL (ref 82–98)
MONO#: 0.3 10*3/uL (ref 0.1–0.9)
MONO%: 9.3 % (ref 0.0–13.0)
NEUT#: 2.6 10*3/uL (ref 1.5–6.5)
NEUT%: 76.2 % (ref 40.0–80.0)
PLATELETS: 205 10*3/uL (ref 145–400)
RBC: 4.11 10*6/uL — ABNORMAL LOW (ref 4.20–5.70)
RDW: 18.8 % — ABNORMAL HIGH (ref 11.1–15.7)
WBC: 3.5 10*3/uL — ABNORMAL LOW (ref 4.0–10.0)

## 2016-07-27 LAB — CMP (CANCER CENTER ONLY)
ALBUMIN: 3.9 g/dL (ref 3.3–5.5)
ALT(SGPT): 49 U/L — ABNORMAL HIGH (ref 10–47)
AST: 34 U/L (ref 11–38)
Alkaline Phosphatase: 67 U/L (ref 26–84)
BUN, Bld: 9 mg/dL (ref 7–22)
CHLORIDE: 103 meq/L (ref 98–108)
CO2: 29 meq/L (ref 18–33)
CREATININE: 0.9 mg/dL (ref 0.6–1.2)
Calcium: 9.6 mg/dL (ref 8.0–10.3)
Glucose, Bld: 123 mg/dL — ABNORMAL HIGH (ref 73–118)
POTASSIUM: 3.4 meq/L (ref 3.3–4.7)
SODIUM: 142 meq/L (ref 128–145)
Total Bilirubin: 1.2 mg/dl (ref 0.20–1.60)
Total Protein: 7 g/dL (ref 6.4–8.1)

## 2016-07-27 MED ORDER — DEXAMETHASONE SODIUM PHOSPHATE 10 MG/ML IJ SOLN
10.0000 mg | Freq: Once | INTRAMUSCULAR | Status: AC
  Administered 2016-07-27: 10 mg via INTRAVENOUS

## 2016-07-27 MED ORDER — LEUCOVORIN CALCIUM INJECTION 350 MG
400.0000 mg/m2 | Freq: Once | INTRAMUSCULAR | Status: AC
  Administered 2016-07-27: 980 mg via INTRAVENOUS
  Filled 2016-07-27: qty 49

## 2016-07-27 MED ORDER — FLUOROURACIL CHEMO INJECTION 2.5 GM/50ML
400.0000 mg/m2 | Freq: Once | INTRAVENOUS | Status: AC
  Administered 2016-07-27: 1000 mg via INTRAVENOUS
  Filled 2016-07-27: qty 20

## 2016-07-27 MED ORDER — SODIUM CHLORIDE 0.9 % IV SOLN
Freq: Once | INTRAVENOUS | Status: AC
  Administered 2016-07-27: 10:00:00 via INTRAVENOUS

## 2016-07-27 MED ORDER — DEXAMETHASONE SODIUM PHOSPHATE 10 MG/ML IJ SOLN
INTRAMUSCULAR | Status: AC
  Filled 2016-07-27: qty 1

## 2016-07-27 MED ORDER — IRINOTECAN HCL CHEMO INJECTION 100 MG/5ML
144.0000 mg/m2 | Freq: Once | INTRAVENOUS | Status: AC
  Administered 2016-07-27: 360 mg via INTRAVENOUS
  Filled 2016-07-27: qty 15

## 2016-07-27 MED ORDER — PALONOSETRON HCL INJECTION 0.25 MG/5ML
0.2500 mg | Freq: Once | INTRAVENOUS | Status: AC
  Administered 2016-07-27: 0.25 mg via INTRAVENOUS

## 2016-07-27 MED ORDER — PALONOSETRON HCL INJECTION 0.25 MG/5ML
INTRAVENOUS | Status: AC
Start: 2016-07-27 — End: 2016-07-27
  Filled 2016-07-27: qty 5

## 2016-07-27 MED ORDER — VITAMIN B-6 250 MG PO TABS: 500.0000 mg | ORAL_TABLET | Freq: Every day | ORAL | 6 refills | Status: DC

## 2016-07-27 MED ORDER — SODIUM CHLORIDE 0.9 % IV SOLN
2160.0000 mg/m2 | INTRAVENOUS | Status: DC
  Administered 2016-07-27: 5300 mg via INTRAVENOUS
  Filled 2016-07-27: qty 50

## 2016-07-27 MED ORDER — LORAZEPAM 0.5 MG PO TABS
0.5000 mg | ORAL_TABLET | Freq: Once | ORAL | Status: AC
  Administered 2016-07-27: 0.5 mg via ORAL

## 2016-07-27 MED ORDER — LORAZEPAM 0.5 MG PO TABS
ORAL_TABLET | ORAL | Status: AC
  Filled 2016-07-27: qty 1

## 2016-07-27 NOTE — Progress Notes (Signed)
Hematology and Oncology Follow Up Visit  Vincent Strickland [id number] March 21, 1965 51 y.o. 07/27/2016   Principle Diagnosis:  Stage IIIC ([id number]) adenocarcinoma of the upper rectum  Current Therapy:   FOLFOX s/p cycle #7 - completed on 04/21/2016 Radiation with Xeloda - started on 05/10/2016 FOLFIRI - start cycle 1/4 on 07/14/2016    Interim History:  Vincent Strickland is here today with his wife for follow-up. He is doing pretty well. He is still working full-time.  He does have neuropathy. This is in his hands and feet. He is still able to work. He does have a little bit more difficult typing on his computer.  He's had no problems with bowels or bladder. There is no diarrhea. He's had no abdominal pain. There is no cough or shortness of breath. He's had no mouth sores.  His last CEA was 1.3.  He's had no bleeding.  His been no bleeding.  Overall, his performance status is ECOG 1.  Medications:  Allergies as of 07/27/2016      Reactions   Niaspan [niacin]    Severe flushing   Percocet [oxycodone-acetaminophen] Nausea Only   hallucinations      Medication List       Accurate as of 07/27/16  9:41 AM. Always use your most recent med list.          acetaminophen 500 MG tablet Commonly known as:  TYLENOL Take 1,000 mg by mouth every 6 (six) hours as needed (for pain.).   acetaminophen-codeine 300-30 MG tablet Commonly known as:  TYLENOL #3 Take 1-2 tablets by mouth every 4 (four) hours as needed for moderate pain.   capecitabine 500 MG tablet Commonly known as:  XELODA TAKE 4 TABLETS BY MOUTH TWICE DAILY WITH FOOD MONDAY THROUGH FRIDAY   capecitabine 500 MG tablet Commonly known as:  XELODA TAKE 4 TABLETS BY MOUTH TWICE DAILY WITH FOOD MONDAY THROUGH FRIDAY   dexamethasone 4 MG tablet Commonly known as:  DECADRON TAKE 2 TABLETS BY MOUTH EVERY DAY **START THE DAY AFTER CHEMOTHERAPY FOR 2 DAYS. TAKE WITH FOOD   gabapentin 300 MG capsule Commonly known as:  NEURONTIN Take  2 capsules (600 mg total) by mouth at bedtime.   HYDROcodone-acetaminophen 5-325 MG tablet Commonly known as:  NORCO Take 1 tablet by mouth every 4 (four) hours as needed for moderate pain.   losartan-hydrochlorothiazide 100-25 MG tablet Commonly known as:  HYZAAR Take 1 tablet by mouth daily.   polyethylene glycol packet Commonly known as:  MIRALAX / GLYCOLAX Take 17 g by mouth 2 (two) times daily.   potassium chloride SA 20 MEQ tablet Commonly known as:  K-DUR,KLOR-CON Take 2 tablets (40 mEq total) by mouth 2 (two) times daily.   saccharomyces boulardii 250 MG capsule Commonly known as:  FLORASTOR Take 1 capsule (250 mg total) by mouth 2 (two) times daily.   silver sulfADIAZINE 1 % cream Commonly known as:  SILVADENE Apply 1 application topically daily. Apply to affected area twice a day       Allergies:  Allergies  Allergen Reactions  . Niaspan [Niacin]     Severe flushing  . Percocet [Oxycodone-Acetaminophen] Nausea Only    hallucinations    Past Medical History, Surgical history, Social history, and Family History were reviewed and updated.  Review of Systems: All other 10 point review of systems is negative.   Physical Exam:  weight is 257 lb (116.6 kg). His oral temperature is 98.6 F (37 C). His blood pressure is 133/94 (abnormal)  and his pulse is 70. His respiration is 20.   Wt Readings from Last 3 Encounters:  07/27/16 257 lb (116.6 kg)  06/25/16 258 lb (117 kg)  06/24/16 254 lb 6.4 oz (115.4 kg)    Head and neck exam shows no ocular or oral lesions. He has no scleral icterus. There is no conjunctival pallor. There is no adenopathy in the neck. Lungs are clear bilaterally. Cardiac exam regular rate and rhythm with no murmurs, rubs or bruits. Abdomen is soft. His laparoscopy scar is well-healed. He has no fluid wave. There is no guarding or rebound tenderness. Maybe some slight tenderness in the left lower quadrant. He has no palpable liver or spleen tip.  Back exam shows no tenderness over the spine, ribs or hips. Extremities shows no clubbing, cyanosis or edema. He has good range of motion of his joints. Skin exam shows no rashes, ecchymosis or petechia. He does have several hyperpigmented lesions which do not appear to be suspicious. Neurological exam shows no focal neurological deficits.   Lab Results  Component Value Date   WBC 3.5 (L) 07/27/2016   HGB 12.8 (L) 07/27/2016   HCT 36.3 (L) 07/27/2016   MCV 88 07/27/2016   PLT 205 07/27/2016   Lab Results  Component Value Date   FERRITIN 34 01/13/2016   IRON 41 (L) 01/13/2016   TIBC 369 01/13/2016   UIBC 328 01/13/2016   IRONPCTSAT 11 (L) 01/13/2016   Lab Results  Component Value Date   RBC 4.11 (L) 07/27/2016   No results found for: KPAFRELGTCHN, LAMBDASER, KAPLAMBRATIO No results found for: IGGSERUM, IGA, IGMSERUM No results found for: Odetta Pink, SPEI   Chemistry      Component Value Date/Time   NA 145 07/14/2016 0912   K 3.4 07/14/2016 0912   CL 109 (H) 07/14/2016 0912   CO2 27 07/14/2016 0912   BUN 14 07/14/2016 0912   CREATININE 0.9 07/14/2016 0912      Component Value Date/Time   CALCIUM 9.5 07/14/2016 0912   ALKPHOS 72 07/14/2016 0912   AST 39 (H) 07/14/2016 0912   ALT 55 (H) 07/14/2016 0912   BILITOT 1.00 07/14/2016 0912     Impression and Plan: Vincent Strickland is a pleasant 51 yo gentleman with 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   IAm happy that he is doing so well. I hate that he has this neuropathy.  He is taking vitamin B6. I told him to try some magnesium.  We will go ahead with the second cycle of treatment. He has 2 more after this and then we will be finished.  I will plan to see him back in another 2 weeks.   [name], MD 7/3/20189:41 AM

## 2016-07-29 ENCOUNTER — Ambulatory Visit
Admission: RE | Admit: 2016-07-29 | Discharge: 2016-07-29 | Disposition: A | Discharge status: Home / Self Care | Payer: BLUE CROSS/BLUE SHIELD | Source: Ambulatory Visit | Attending: Radiation Oncology | Admitting: Radiation Oncology

## 2016-07-29 ENCOUNTER — Ambulatory Visit (HOSPITAL_BASED_OUTPATIENT_CLINIC_OR_DEPARTMENT_OTHER): Payer: BLUE CROSS/BLUE SHIELD

## 2016-07-29 ENCOUNTER — Encounter: Payer: Self-pay | Admitting: Radiation Oncology

## 2016-07-29 VITALS — BP 138/89 | HR 80 | Resp 18

## 2016-07-29 VITALS — BP 138/99 | HR 73 | Temp 98.0°F | Resp 18 | Ht 73.0 in | Wt 256.4 lb

## 2016-07-29 DIAGNOSIS — C772 Secondary and unspecified malignant neoplasm of intra-abdominal lymph nodes: Principal | ICD-10-CM

## 2016-07-29 DIAGNOSIS — C187 Malignant neoplasm of sigmoid colon: Secondary | ICD-10-CM | POA: Insufficient documentation

## 2016-07-29 MED ORDER — HEPARIN SOD (PORK) LOCK FLUSH 100 UNIT/ML IV SOLN
500.0000 [IU] | Freq: Once | INTRAVENOUS | Status: AC | PRN
  Administered 2016-07-29: 500 [IU]
  Filled 2016-07-29: qty 5

## 2016-07-29 MED ORDER — SODIUM CHLORIDE 0.9% FLUSH
10.0000 mL | INTRAVENOUS | Status: DC | PRN
Start: 2016-07-29 — End: 2016-07-29
  Administered 2016-07-29: 10 mL
  Filled 2016-07-29: qty 10

## 2016-07-29 NOTE — Addendum Note (Signed)
Encounter addended by: Malena Edman, RN on: 07/29/2016  4:58 PM<BR>    Actions taken: Charge Capture section accepted

## 2016-07-29 NOTE — Progress Notes (Signed)
Vincent Strickland 51 year old man with adenocarcinoma of the upper rectum radiation completed 06-25-16 one month FU.      PainNone Nausea/ Vomiting:Mild nausea taking Zofran as needed. Diarrhea:No Skin irritation:No Vaginal/Rectal bleeding:Minor rectal bleeding from hemorroids. Fatigue:Having fatigue has very little energy Loss of appetite:Does not feel like eating making himself eat. Weight: Wt Readings from Last 3 Encounters:  07/29/16 256 lb 6.4 oz (116.3 kg)  07/27/16 257 lb (116.6 kg)  06/25/16 258 lb (117 kg)   07-27-16 Saw Dr Marin Olp  FOLFIRI - start cycle 1/4 on 07/14/2016  BP (!) 138/99   Pulse 73   Temp 98 F (36.7 C) (Oral)   Resp 18   Ht 6\' 1"  (1.854 m)   Wt 256 lb 6.4 oz (116.3 kg)   SpO2 98%   BMI 33.83 kg/m

## 2016-07-29 NOTE — Progress Notes (Signed)
Radiation Oncology         [phone] ________________________________  Name: Vincent Strickland MRN: [id number]  Date: 07/29/2016  DOB: 12/04/1965  Follow-Up Visit Note  CC: Vernie Shanks, MD  [name], MD    ICD-10-CM   1. 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  Patient does have neuropathy. He does have neuropathy. This is in his hands and feet. He is still able to work. He does have a little bit more difficult typing on his computer. He's had no problems with bowels or bladder. There is no diarrhea. He's had no abdominal pain. There is no cough or shortness of breath. He's had no mouth sores.He presents today with his mother.                       ALLERGIES:  is allergic to niaspan [niacin] and percocet [oxycodone-acetaminophen].  Meds: Current Outpatient Prescriptions  Medication Sig Dispense Refill  . acetaminophen (TYLENOL) 500 MG tablet Take 1,000 mg by mouth every 6 (six) hours as needed (for pain.).    Marland Kitchen gabapentin (NEURONTIN) 300 MG capsule Take 2 capsules (600 mg total) by mouth at bedtime. 60 capsule 3  . losartan-hydrochlorothiazide (HYZAAR) 100-25 MG tablet Take 1 tablet by mouth daily. 90 tablet 3  . polyethylene glycol (MIRALAX / GLYCOLAX) packet Take 17 g by mouth 2 (two) times daily.    . potassium chloride SA (K-DUR,KLOR-CON) 20 MEQ tablet Take 2 tablets (40 mEq total) by mouth 2 (two) times daily. 120 tablet 4  . Pyridoxine HCl (VITAMIN B-6) 250 MG tablet Take 2 tablets (500 mg total) by mouth daily. 60 tablet 6  . saccharomyces boulardii (FLORASTOR) 250 MG capsule Take 1 capsule (250 mg total) by mouth 2 (two) times  daily. 20 capsule 0  . acetaminophen-codeine (TYLENOL #3) 300-30 MG tablet Take 1-2 tablets by mouth every 4 (four) hours as needed for moderate pain. (Patient not taking: Reported on 07/29/2016) 15 tablet 0  . capecitabine (XELODA) 500 MG tablet TAKE 4 TABLETS BY MOUTH TWICE DAILY WITH FOOD MONDAY THROUGH FRIDAY (Patient not taking: Reported on 07/29/2016) 200 tablet 0  . capecitabine (XELODA) 500 MG tablet TAKE 4 TABLETS BY MOUTH TWICE DAILY WITH FOOD MONDAY THROUGH FRIDAY (Patient not taking: Reported on 06/24/2016) 200 tablet 0  . dexamethasone (DECADRON) 4 MG tablet TAKE 2 TABLETS BY MOUTH EVERY DAY **START THE DAY AFTER CHEMOTHERAPY FOR 2 DAYS. TAKE WITH FOOD (Patient not taking: Reported on 06/22/2016) 30 tablet 1  . HYDROcodone-acetaminophen (NORCO) 5-325 MG tablet Take 1 tablet by mouth every 4 (four) hours as needed for moderate pain. (Patient not taking: Reported on 05/05/2016) 30 tablet 0   No current facility-administered medications for this encounter.    REVIEW OF SYSTEMS: A 10+ POINT REVIEW OF SYSTEMS WAS OBTAINED including neurology, dermatology, psychiatry, cardiac, respiratory, lymph, extremities, GI, GU, musculoskeletal, constitutional, reproductive, HEENT. All pertinent positives are noted in the HPI. All others are negative.  Physical Findings: The patient is in no acute distress. Patient is alert and oriented.  height is 6\' 1"  (1.854 m) and weight is 256 lb  6.4 oz (116.3 kg). His oral temperature is 98 F (36.7 C). His blood pressure is 138/99 (abnormal) and his pulse is 73. His respiration is 18 and oxygen saturation is 98%. .  General: Alert and oriented, in no acute distress Neck: Neck is supple, no palpable cervical or supraclavicular lymphadenopathy. Heart: Regular in rate and rhythm with no murmurs, rubs, or gallops. Chest: Clear to auscultation bilaterally, with no rhonchi, wheezes, or rales. Abdomen: Soft, nontender, nondistended, with no rigidity or guarding.   Lab  Findings: Lab Results  Component Value Date   WBC 3.5 (L) 07/27/2016   HGB 12.8 (L) 07/27/2016   HCT 36.3 (L) 07/27/2016   MCV 88 07/27/2016   PLT 205 07/27/2016    Radiographic Findings: No results found.  Impression:  Stage IIIC ([id number]) adenocarcinoma of the upper rectum. The patient is recovering well from the effects of radiation.   Plan: prn f/u in rad/onc. Two more cycles of chemotherapy planned.  Blair Promise, PhD, MD  This document serves as a record of services personally performed by Gery Pray, MD. It was created on his behalf by Valeta Harms, a trained medical scribe. The creation of this record is based on the scribe's personal observations and the provider's statements to them. This document has been checked and approved by the attending provider.

## 2016-08-11 ENCOUNTER — Ambulatory Visit: Payer: BLUE CROSS/BLUE SHIELD

## 2016-08-11 ENCOUNTER — Ambulatory Visit (HOSPITAL_BASED_OUTPATIENT_CLINIC_OR_DEPARTMENT_OTHER): Payer: BLUE CROSS/BLUE SHIELD | Admitting: Hematology & Oncology

## 2016-08-11 ENCOUNTER — Other Ambulatory Visit: Payer: Self-pay | Admitting: Family

## 2016-08-11 ENCOUNTER — Ambulatory Visit (HOSPITAL_BASED_OUTPATIENT_CLINIC_OR_DEPARTMENT_OTHER): Payer: BLUE CROSS/BLUE SHIELD

## 2016-08-11 ENCOUNTER — Other Ambulatory Visit (HOSPITAL_BASED_OUTPATIENT_CLINIC_OR_DEPARTMENT_OTHER): Payer: BLUE CROSS/BLUE SHIELD

## 2016-08-11 VITALS — BP 121/89 | HR 79 | Temp 98.2°F | Resp 16 | Wt 258.0 lb

## 2016-08-11 DIAGNOSIS — Z85038 Personal history of other malignant neoplasm of large intestine: Secondary | ICD-10-CM

## 2016-08-11 DIAGNOSIS — C187 Malignant neoplasm of sigmoid colon: Secondary | ICD-10-CM

## 2016-08-11 DIAGNOSIS — C19 Malignant neoplasm of rectosigmoid junction: Secondary | ICD-10-CM

## 2016-08-11 DIAGNOSIS — R110 Nausea: Secondary | ICD-10-CM

## 2016-08-11 DIAGNOSIS — C772 Secondary and unspecified malignant neoplasm of intra-abdominal lymph nodes: Principal | ICD-10-CM

## 2016-08-11 DIAGNOSIS — Z5111 Encounter for antineoplastic chemotherapy: Secondary | ICD-10-CM

## 2016-08-11 LAB — CBC WITH DIFFERENTIAL (CANCER CENTER ONLY)
BASO#: 0 10*3/uL (ref 0.0–0.2)
BASO%: 1.4 % (ref 0.0–2.0)
EOS ABS: 0.1 10*3/uL (ref 0.0–0.5)
EOS%: 2.7 % (ref 0.0–7.0)
HEMATOCRIT: 36.3 % — AB (ref 38.7–49.9)
HGB: 12.8 g/dL — ABNORMAL LOW (ref 13.0–17.1)
LYMPH#: 0.5 10*3/uL — AB (ref 0.9–3.3)
LYMPH%: 16.6 % (ref 14.0–48.0)
MCH: 31.5 pg (ref 28.0–33.4)
MCHC: 35.3 g/dL (ref 32.0–35.9)
MCV: 89 fL (ref 82–98)
MONO#: 0.4 10*3/uL (ref 0.1–0.9)
MONO%: 13.2 % — ABNORMAL HIGH (ref 0.0–13.0)
NEUT#: 2 10*3/uL (ref 1.5–6.5)
NEUT%: 66.1 % (ref 40.0–80.0)
Platelets: 203 10*3/uL (ref 145–400)
RBC: 4.06 10*6/uL — ABNORMAL LOW (ref 4.20–5.70)
RDW: 17.6 % — AB (ref 11.1–15.7)
WBC: 3 10*3/uL — ABNORMAL LOW (ref 4.0–10.0)

## 2016-08-11 LAB — CMP (CANCER CENTER ONLY)
ALK PHOS: 70 U/L (ref 26–84)
ALT: 44 U/L (ref 10–47)
AST: 33 U/L (ref 11–38)
Albumin: 3.9 g/dL (ref 3.3–5.5)
BUN, Bld: 8 mg/dL (ref 7–22)
CALCIUM: 9.4 mg/dL (ref 8.0–10.3)
CHLORIDE: 105 meq/L (ref 98–108)
CO2: 29 mEq/L (ref 18–33)
Creat: 1 mg/dl (ref 0.6–1.2)
GLUCOSE: 104 mg/dL (ref 73–118)
POTASSIUM: 3.3 meq/L (ref 3.3–4.7)
Sodium: 142 mEq/L (ref 128–145)
TOTAL PROTEIN: 6.9 g/dL (ref 6.4–8.1)
Total Bilirubin: 1.1 mg/dl (ref 0.20–1.60)

## 2016-08-11 LAB — CEA (IN HOUSE-CHCC): CEA (CHCC-In House): 1.49 ng/mL (ref 0.00–5.00)

## 2016-08-11 MED ORDER — DEXAMETHASONE SODIUM PHOSPHATE 10 MG/ML IJ SOLN
INTRAMUSCULAR | Status: AC
  Filled 2016-08-11: qty 1

## 2016-08-11 MED ORDER — LORAZEPAM 0.5 MG PO TABS
ORAL_TABLET | ORAL | Status: AC
  Filled 2016-08-11: qty 1

## 2016-08-11 MED ORDER — ATROPINE SULFATE 1 MG/ML IJ SOLN
0.5000 mg | Freq: Once | INTRAMUSCULAR | Status: AC | PRN
  Administered 2016-08-11: 0.5 mg via INTRAVENOUS

## 2016-08-11 MED ORDER — LEUCOVORIN CALCIUM INJECTION 350 MG
400.0000 mg/m2 | Freq: Once | INTRAVENOUS | Status: AC
  Administered 2016-08-11: 980 mg via INTRAVENOUS
  Filled 2016-08-11: qty 49

## 2016-08-11 MED ORDER — PALONOSETRON HCL INJECTION 0.25 MG/5ML
0.2500 mg | Freq: Once | INTRAVENOUS | Status: AC
Start: 2016-08-11 — End: 2016-08-11
  Administered 2016-08-11: 0.25 mg via INTRAVENOUS

## 2016-08-11 MED ORDER — ATROPINE SULFATE 1 MG/ML IJ SOLN
INTRAMUSCULAR | Status: AC
  Filled 2016-08-11: qty 1

## 2016-08-11 MED ORDER — SODIUM CHLORIDE 0.9 % IV SOLN
Freq: Once | INTRAVENOUS | Status: AC
  Administered 2016-08-11: 10:00:00 via INTRAVENOUS

## 2016-08-11 MED ORDER — DEXAMETHASONE SODIUM PHOSPHATE 10 MG/ML IJ SOLN
10.0000 mg | Freq: Once | INTRAMUSCULAR | Status: AC
  Administered 2016-08-11: 10 mg via INTRAVENOUS

## 2016-08-11 MED ORDER — PALONOSETRON HCL INJECTION 0.25 MG/5ML
INTRAVENOUS | Status: AC
Start: 2016-08-11 — End: 2016-08-11
  Filled 2016-08-11: qty 5

## 2016-08-11 MED ORDER — FLUOROURACIL CHEMO INJECTION 2.5 GM/50ML
400.0000 mg/m2 | Freq: Once | INTRAVENOUS | Status: AC
  Administered 2016-08-11: 1000 mg via INTRAVENOUS
  Filled 2016-08-11: qty 20

## 2016-08-11 MED ORDER — IRINOTECAN HCL CHEMO INJECTION 100 MG/5ML
144.0000 mg/m2 | Freq: Once | INTRAVENOUS | Status: AC
  Administered 2016-08-11: 360 mg via INTRAVENOUS
  Filled 2016-08-11: qty 15

## 2016-08-11 MED ORDER — SODIUM CHLORIDE 0.9 % IV SOLN
2160.0000 mg/m2 | INTRAVENOUS | Status: DC
  Administered 2016-08-11: 5300 mg via INTRAVENOUS
  Filled 2016-08-11: qty 106

## 2016-08-11 MED ORDER — LORAZEPAM 0.5 MG PO TABS
0.5000 mg | ORAL_TABLET | Freq: Once | ORAL | Status: AC
  Administered 2016-08-11: 0.5 mg via ORAL

## 2016-08-11 NOTE — Patient Instructions (Signed)
Implanted Port Home Guide An implanted port is a type of central line that is placed under the skin. Central lines are used to provide IV access when treatment or nutrition needs to be given through a person's veins. Implanted ports are used for long-term IV access. An implanted port may be placed because:  You need IV medicine that would be irritating to the small veins in your hands or arms.  You need long-term IV medicines, such as antibiotics.  You need IV nutrition for a long period.  You need frequent blood draws for lab tests.  You need dialysis.  Implanted ports are usually placed in the chest area, but they can also be placed in the upper arm, the abdomen, or the leg. An implanted port has two main parts:  Reservoir. The reservoir is round and will appear as a small, raised area under your skin. The reservoir is the part where a needle is inserted to give medicines or draw blood.  Catheter. The catheter is a thin, flexible tube that extends from the reservoir. The catheter is placed into a large vein. Medicine that is inserted into the reservoir goes into the catheter and then into the vein.  How will I care for my incision site? Do not get the incision site wet. Bathe or shower as directed by your health care provider. How is my port accessed? Special steps must be taken to access the port:  Before the port is accessed, a numbing cream can be placed on the skin. This helps numb the skin over the port site.  Your health care provider uses a sterile technique to access the port. ? Your health care provider must put on a mask and sterile gloves. ? The skin over your port is cleaned carefully with an antiseptic and allowed to dry. ? The port is gently pinched between sterile gloves, and a needle is inserted into the port.  Only "non-coring" port needles should be used to access the port. Once the port is accessed, a blood return should be checked. This helps ensure that the port  is in the vein and is not clogged.  If your port needs to remain accessed for a constant infusion, a clear (transparent) bandage will be placed over the needle site. The bandage and needle will need to be changed every week, or as directed by your health care provider.  Keep the bandage covering the needle clean and dry. Do not get it wet. Follow your health care provider's instructions on how to take a shower or bath while the port is accessed.  If your port does not need to stay accessed, no bandage is needed over the port.  What is flushing? Flushing helps keep the port from getting clogged. Follow your health care provider's instructions on how and when to flush the port. Ports are usually flushed with saline solution or a medicine called heparin. The need for flushing will depend on how the port is used.  If the port is used for intermittent medicines or blood draws, the port will need to be flushed: ? After medicines have been given. ? After blood has been drawn. ? As part of routine maintenance.  If a constant infusion is running, the port may not need to be flushed.  How long will my port stay implanted? The port can stay in for as long as your health care provider thinks it is needed. When it is time for the port to come out, surgery will be   done to remove it. The procedure is similar to the one performed when the port was put in. When should I seek immediate medical care? When you have an implanted port, you should seek immediate medical care if:  You notice a bad smell coming from the incision site.  You have swelling, redness, or drainage at the incision site.  You have more swelling or pain at the port site or the surrounding area.  You have a fever that is not controlled with medicine.  This information is not intended to replace advice given to you by your health care provider. Make sure you discuss any questions you have with your health care provider. Document  Released: 01/11/2005 Document Revised: 06/19/2015 Document Reviewed: 09/18/2012 Elsevier Interactive Patient Education  2017 Elsevier Inc.  

## 2016-08-11 NOTE — Patient Instructions (Signed)
Fluorouracil, 5-FU injection What is this medicine? FLUOROURACIL, 5-FU (flure oh YOOR a sil) is a chemotherapy drug. It slows the growth of cancer cells. This medicine is used to treat many types of cancer like breast cancer, colon or rectal cancer, pancreatic cancer, and stomach cancer. This medicine may be used for other purposes; ask your health care provider or pharmacist if you have questions. COMMON BRAND NAME(S): Adrucil What should I tell my health care provider before I take this medicine? They need to know if you have any of these conditions: -blood disorders -dihydropyrimidine dehydrogenase (DPD) deficiency -infection (especially a virus infection such as chickenpox, cold sores, or herpes) -kidney disease -liver disease -malnourished, poor nutrition -recent or ongoing radiation therapy -an unusual or allergic reaction to fluorouracil, other chemotherapy, other medicines, foods, dyes, or preservatives -pregnant or trying to get pregnant -breast-feeding How should I use this medicine? This drug is given as an infusion or injection into a vein. It is administered in a hospital or clinic by a specially trained health care professional. Talk to your pediatrician regarding the use of this medicine in children. Special care may be needed. Overdosage: If you think you have taken too much of this medicine contact a poison control center or emergency room at once. NOTE: This medicine is only for you. Do not share this medicine with others. What if I miss a dose? It is important not to miss your dose. Call your doctor or health care professional if you are unable to keep an appointment. What may interact with this medicine? -allopurinol -cimetidine -dapsone -digoxin -hydroxyurea -leucovorin -levamisole -medicines for seizures like ethotoin, fosphenytoin, phenytoin -medicines to increase blood counts like filgrastim, pegfilgrastim, sargramostim -medicines that treat or prevent blood  clots like warfarin, enoxaparin, and dalteparin -methotrexate -metronidazole -pyrimethamine -some other chemotherapy drugs like busulfan, cisplatin, estramustine, vinblastine -trimethoprim -trimetrexate -vaccines Talk to your doctor or health care professional before taking any of these medicines: -acetaminophen -aspirin -ibuprofen -ketoprofen -naproxen This list may not describe all possible interactions. Give your health care provider a list of all the medicines, herbs, non-prescription drugs, or dietary supplements you use. Also tell them if you smoke, drink alcohol, or use illegal drugs. Some items may interact with your medicine. What should I watch for while using this medicine? Visit your doctor for checks on your progress. This drug may make you feel generally unwell. This is not uncommon, as chemotherapy can affect healthy cells as well as cancer cells. Report any side effects. Continue your course of treatment even though you feel ill unless your doctor tells you to stop. In some cases, you may be given additional medicines to help with side effects. Follow all directions for their use. Call your doctor or health care professional for advice if you get a fever, chills or sore throat, or other symptoms of a cold or flu. Do not treat yourself. This drug decreases your body's ability to fight infections. Try to avoid being around people who are sick. This medicine may increase your risk to bruise or bleed. Call your doctor or health care professional if you notice any unusual bleeding. Be careful brushing and flossing your teeth or using a toothpick because you may get an infection or bleed more easily. If you have any dental work done, tell your dentist you are receiving this medicine. Avoid taking products that contain aspirin, acetaminophen, ibuprofen, naproxen, or ketoprofen unless instructed by your doctor. These medicines may hide a fever. Do not become pregnant while taking this  medicine. Women should inform their doctor if they wish to become pregnant or think they might be pregnant. There is a potential for serious side effects to an unborn child. Talk to your health care professional or pharmacist for more information. Do not breast-feed an infant while taking this medicine. Men should inform their doctor if they wish to father a child. This medicine may lower sperm counts. Do not treat diarrhea with over the counter products. Contact your doctor if you have diarrhea that lasts more than 2 days or if it is severe and watery. This medicine can make you more sensitive to the sun. Keep out of the sun. If you cannot avoid being in the sun, wear protective clothing and use sunscreen. Do not use sun lamps or tanning beds/booths. What side effects may I notice from receiving this medicine? Side effects that you should report to your doctor or health care professional as soon as possible: -allergic reactions like skin rash, itching or hives, swelling of the face, lips, or tongue -low blood counts - this medicine may decrease the number of white blood cells, red blood cells and platelets. You may be at increased risk for infections and bleeding. -signs of infection - fever or chills, cough, sore throat, pain or difficulty passing urine -signs of decreased platelets or bleeding - bruising, pinpoint red spots on the skin, black, tarry stools, blood in the urine -signs of decreased red blood cells - unusually weak or tired, fainting spells, lightheadedness -breathing problems -changes in vision -chest pain -mouth sores -nausea and vomiting -pain, swelling, redness at site where injected -pain, tingling, numbness in the hands or feet -redness, swelling, or sores on hands or feet -stomach pain -unusual bleeding Side effects that usually do not require medical attention (report to your doctor or health care professional if they continue or are bothersome): -changes in finger or  toe nails -diarrhea -dry or itchy skin -hair loss -headache -loss of appetite -sensitivity of eyes to the light -stomach upset -unusually teary eyes This list may not describe all possible side effects. Call your doctor for medical advice about side effects. You may report side effects to FDA at 1-800-FDA-1088. Where should I keep my medicine? This drug is given in a hospital or clinic and will not be stored at home. NOTE: This sheet is a summary. It may not cover all possible information. If you have questions about this medicine, talk to your doctor, pharmacist, or health care provider.  2018 Elsevier/Gold Standard (2007-05-17 13:53:16) Leucovorin injection What is this medicine? LEUCOVORIN (loo koe VOR in) is used to prevent or treat the harmful effects of some medicines. This medicine is used to treat anemia caused by a low amount of folic acid in the body. It is also used with 5-fluorouracil (5-FU) to treat colon cancer. This medicine may be used for other purposes; ask your health care provider or pharmacist if you have questions. What should I tell my health care provider before I take this medicine? They need to know if you have any of these conditions: -anemia from low levels of vitamin B-12 in the blood -an unusual or allergic reaction to leucovorin, folic acid, other medicines, foods, dyes, or preservatives -pregnant or trying to get pregnant -breast-feeding How should I use this medicine? This medicine is for injection into a muscle or into a vein. It is given by a health care professional in a hospital or clinic setting. Talk to your pediatrician regarding the use of this medicine in   children. Special care may be needed. Overdosage: If you think you have taken too much of this medicine contact a poison control center or emergency room at once. NOTE: This medicine is only for you. Do not share this medicine with others. What if I miss a dose? This does not apply. What may  interact with this medicine? -capecitabine -fluorouracil -phenobarbital -phenytoin -primidone -trimethoprim-sulfamethoxazole This list may not describe all possible interactions. Give your health care provider a list of all the medicines, herbs, non-prescription drugs, or dietary supplements you use. Also tell them if you smoke, drink alcohol, or use illegal drugs. Some items may interact with your medicine. What should I watch for while using this medicine? Your condition will be monitored carefully while you are receiving this medicine. This medicine may increase the side effects of 5-fluorouracil, 5-FU. Tell your doctor or health care professional if you have diarrhea or mouth sores that do not get better or that get worse. What side effects may I notice from receiving this medicine? Side effects that you should report to your doctor or health care professional as soon as possible: -allergic reactions like skin rash, itching or hives, swelling of the face, lips, or tongue -breathing problems -fever, infection -mouth sores -unusual bleeding or bruising -unusually weak or tired Side effects that usually do not require medical attention (report to your doctor or health care professional if they continue or are bothersome): -constipation or diarrhea -loss of appetite -nausea, vomiting This list may not describe all possible side effects. Call your doctor for medical advice about side effects. You may report side effects to FDA at 1-800-FDA-1088. Where should I keep my medicine? This drug is given in a hospital or clinic and will not be stored at home. NOTE: This sheet is a summary. It may not cover all possible information. If you have questions about this medicine, talk to your doctor, pharmacist, or health care provider.  2018 Elsevier/Gold Standard (2007-07-18 16:50:29) Irinotecan injection What is this medicine? IRINOTECAN (ir in oh TEE kan ) is a chemotherapy drug. It is used to  treat colon and rectal cancer. This medicine may be used for other purposes; ask your health care provider or pharmacist if you have questions. COMMON BRAND NAME(S): Camptosar What should I tell my health care provider before I take this medicine? They need to know if you have any of these conditions: -blood disorders -dehydration -diarrhea -infection (especially a virus infection such as chickenpox, cold sores, or herpes) -liver disease -low blood counts, like low white cell, platelet, or red cell counts -recent or ongoing radiation therapy -an unusual or allergic reaction to irinotecan, sorbitol, other chemotherapy, other medicines, foods, dyes, or preservatives -pregnant or trying to get pregnant -breast-feeding How should I use this medicine? This drug is given as an infusion into a vein. It is administered in a hospital or clinic by a specially trained health care professional. Talk to your pediatrician regarding the use of this medicine in children. Special care may be needed. Overdosage: If you think you have taken too much of this medicine contact a poison control center or emergency room at once. NOTE: This medicine is only for you. Do not share this medicine with others. What if I miss a dose? It is important not to miss your dose. Call your doctor or health care professional if you are unable to keep an appointment. What may interact with this medicine? Do not take this medicine with any of the following medications: -atazanavir -certain   medicines for fungal infections like itraconazole and ketoconazole -St. John's Wort This medicine may also interact with the following medications: -dexamethasone -diuretics -laxatives -medicines for seizures like carbamazepine, mephobarbital, phenobarbital, phenytoin, primidone -medicines to increase blood counts like filgrastim, pegfilgrastim, sargramostim -prochlorperazine -vaccines This list may not describe all possible  interactions. Give your health care provider a list of all the medicines, herbs, non-prescription drugs, or dietary supplements you use. Also tell them if you smoke, drink alcohol, or use illegal drugs. Some items may interact with your medicine. What should I watch for while using this medicine? Your condition will be monitored carefully while you are receiving this medicine. You will need important blood work done while you are taking this medicine. This drug may make you feel generally unwell. This is not uncommon, as chemotherapy can affect healthy cells as well as cancer cells. Report any side effects. Continue your course of treatment even though you feel ill unless your doctor tells you to stop. In some cases, you may be given additional medicines to help with side effects. Follow all directions for their use. You may get drowsy or dizzy. Do not drive, use machinery, or do anything that needs mental alertness until you know how this medicine affects you. Do not stand or sit up quickly, especially if you are an older patient. This reduces the risk of dizzy or fainting spells. Call your doctor or health care professional for advice if you get a fever, chills or sore throat, or other symptoms of a cold or flu. Do not treat yourself. This drug decreases your body's ability to fight infections. Try to avoid being around people who are sick. This medicine may increase your risk to bruise or bleed. Call your doctor or health care professional if you notice any unusual bleeding. Be careful brushing and flossing your teeth or using a toothpick because you may get an infection or bleed more easily. If you have any dental work done, tell your dentist you are receiving this medicine. Avoid taking products that contain aspirin, acetaminophen, ibuprofen, naproxen, or ketoprofen unless instructed by your doctor. These medicines may hide a fever. Do not become pregnant while taking this medicine. Women should  inform their doctor if they wish to become pregnant or think they might be pregnant. There is a potential for serious side effects to an unborn child. Talk to your health care professional or pharmacist for more information. Do not breast-feed an infant while taking this medicine. What side effects may I notice from receiving this medicine? Side effects that you should report to your doctor or health care professional as soon as possible: -allergic reactions like skin rash, itching or hives, swelling of the face, lips, or tongue -low blood counts - this medicine may decrease the number of white blood cells, red blood cells and platelets. You may be at increased risk for infections and bleeding. -signs of infection - fever or chills, cough, sore throat, pain or difficulty passing urine -signs of decreased platelets or bleeding - bruising, pinpoint red spots on the skin, black, tarry stools, blood in the urine -signs of decreased red blood cells - unusually weak or tired, fainting spells, lightheadedness -breathing problems -chest pain -diarrhea -feeling faint or lightheaded, falls -flushing, runny nose, sweating during infusion -mouth sores or pain -pain, swelling, redness or irritation where injected -pain, swelling, warmth in the leg -pain, tingling, numbness in the hands or feet -problems with balance, talking, walking -stomach cramps, pain -trouble passing urine or change in   the amount of urine -vomiting as to be unable to hold down drinks or food -yellowing of the eyes or skin Side effects that usually do not require medical attention (report to your doctor or health care professional if they continue or are bothersome): -constipation -hair loss -headache -loss of appetite -nausea, vomiting -stomach upset This list may not describe all possible side effects. Call your doctor for medical advice about side effects. You may report side effects to FDA at 1-800-FDA-1088. Where should I  keep my medicine? This drug is given in a hospital or clinic and will not be stored at home. NOTE: This sheet is a summary. It may not cover all possible information. If you have questions about this medicine, talk to your doctor, pharmacist, or health care provider.  2018 Elsevier/Gold Standard (2012-07-10 16:29:32)  

## 2016-08-11 NOTE — Progress Notes (Signed)
Hematology and Oncology Follow Up Visit  Vincent Strickland [id number] 09/15/1965 51 y.o. 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 He is doing pretty well. He is still working full-time.  He does have neuropathy. This is in his hands and feet. He is still able to work. He does have a little bit more difficult typing on his computer.  He's had no problems with bowels or bladder. There is no diarrhea. He's had no abdominal pain. There is no cough or shortness of breath. He's had no mouth sores.  His last CEA was 1.3.  He's had no bleeding.  His been no bleeding.  Overall, his performance status is ECOG 1.  Medications:  Allergies as of 08/11/2016      Reactions   Niaspan [niacin]    Severe flushing   Percocet [oxycodone-acetaminophen] Nausea Only   hallucinations      Medication List       Accurate as of 08/11/16  9:11 AM. Always use your most recent med list.          acetaminophen 500 MG tablet Commonly known as:  TYLENOL Take 1,000 mg by mouth every 6 (six) hours as needed (for pain.).   acetaminophen-codeine 300-30 MG tablet Commonly known as:  TYLENOL #3 Take 1-2 tablets by mouth every 4 (four) hours as needed for moderate pain.   dexamethasone 4 MG tablet Commonly known as:  DECADRON TAKE 2 TABLETS BY MOUTH EVERY DAY **START THE DAY AFTER CHEMOTHERAPY FOR 2 DAYS. TAKE WITH FOOD   gabapentin 300 MG capsule Commonly known as:  NEURONTIN Take 2 capsules (600 mg total) by mouth at bedtime.   HYDROcodone-acetaminophen 5-325 MG tablet Commonly known as:  NORCO Take 1 tablet by mouth every 4 (four) hours as needed for moderate pain.   losartan-hydrochlorothiazide 100-25 MG tablet Commonly known as:   HYZAAR Take 1 tablet by mouth daily.   omeprazole 20 MG capsule Commonly known as:  PRILOSEC Take 20 mg by mouth daily.   polyethylene glycol packet Commonly known as:  MIRALAX / GLYCOLAX Take 17 g by mouth 2 (two) times daily.   potassium chloride SA 20 MEQ tablet Commonly known as:  K-DUR,KLOR-CON Take 2 tablets (40 mEq total) by mouth 2 (two) times daily.   saccharomyces boulardii 250 MG capsule Commonly known as:  FLORASTOR Take 1 capsule (250 mg total) by mouth 2 (two) times daily.   vitamin B-6 250 MG tablet Take 2 tablets (500 mg total) by mouth daily.       Allergies:  Allergies  Allergen Reactions  . Niaspan [Niacin]     Severe flushing  . Percocet [Oxycodone-Acetaminophen] Nausea Only    hallucinations    Past Medical History, Surgical history, Social history, and Family History were reviewed and updated.  Review of Systems: All other 10 point review of systems is negative.   Physical Exam:  weight is 258 lb (117 kg). His oral temperature is 98.2 F (36.8 C). His blood pressure is 121/89 and his pulse is 79. His respiration is 16 and oxygen saturation is 100%.   Wt Readings from Last 3 Encounters:  08/11/16 258 lb (117 kg)  07/29/16 256 lb 6.4 oz (116.3 kg)  07/27/16 257  lb (116.6 kg)    Head and neck exam shows no ocular or oral lesions. He has no scleral icterus. There is no conjunctival pallor. There is no adenopathy in the neck. Lungs are clear bilaterally. Cardiac exam regular rate and rhythm with no murmurs, rubs or bruits. Abdomen is soft. His laparoscopy scar is well-healed. He has no fluid wave. There is no guarding or rebound tenderness. Maybe some slight tenderness in the left lower quadrant. He has no palpable liver or spleen tip. Back exam shows no tenderness over the spine, ribs or hips. Extremities shows no clubbing, cyanosis or edema. He has good range of motion of his joints. Skin exam shows no rashes, ecchymosis or petechia. He does have  several hyperpigmented lesions which do not appear to be suspicious. Neurological exam shows no focal neurological deficits.   Lab Results  Component Value Date   WBC 3.0 (L) 08/11/2016   HGB 12.8 (L) 08/11/2016   HCT 36.3 (L) 08/11/2016   MCV 89 08/11/2016   PLT 203 08/11/2016   Lab Results  Component Value Date   FERRITIN 34 01/13/2016   IRON 41 (L) 01/13/2016   TIBC 369 01/13/2016   UIBC 328 01/13/2016   IRONPCTSAT 11 (L) 01/13/2016   Lab Results  Component Value Date   RBC 4.06 (L) 08/11/2016   No results found for: KPAFRELGTCHN, LAMBDASER, KAPLAMBRATIO No results found for: IGGSERUM, IGA, IGMSERUM No results found for: Odetta Pink, SPEI   Chemistry      Component Value Date/Time   NA 142 08/11/2016 0821   K 3.3 08/11/2016 0821   CL 105 08/11/2016 0821   CO2 29 08/11/2016 0821   BUN 8 08/11/2016 0821   CREATININE 1.0 08/11/2016 0821      Component Value Date/Time   CALCIUM 9.4 08/11/2016 0821   ALKPHOS 70 08/11/2016 0821   AST 33 08/11/2016 0821   ALT 44 08/11/2016 0821   BILITOT 1.10 08/11/2016 0821     Impression and Plan: Vincent Strickland is a pleasant 51 yo gentleman with 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   I am happy that he is doing so well. I hate that he has this neuropathy.  He is taking vitamin B6. I told him to try some magnesium.  I will plan to see him back in another 2 weeks.That'll be his fourth and final cycle of treatment.   [name], MD 7/18/20189:11 AM

## 2016-08-11 NOTE — Progress Notes (Signed)
Upon completion of Camptosar/Leucovorin infusions, pt states "this medicine always makes my esophagus burn and feel like it is cramping." Atropine administered. Within minutes pt reports sx resolving. Will administer prior to next dose. dph

## 2016-08-12 ENCOUNTER — Other Ambulatory Visit: Payer: Self-pay | Admitting: Family Medicine

## 2016-08-13 ENCOUNTER — Ambulatory Visit (HOSPITAL_BASED_OUTPATIENT_CLINIC_OR_DEPARTMENT_OTHER): Payer: BLUE CROSS/BLUE SHIELD

## 2016-08-13 VITALS — BP 128/81 | HR 75 | Temp 98.1°F | Resp 20

## 2016-08-13 DIAGNOSIS — C772 Secondary and unspecified malignant neoplasm of intra-abdominal lymph nodes: Principal | ICD-10-CM

## 2016-08-13 DIAGNOSIS — C19 Malignant neoplasm of rectosigmoid junction: Secondary | ICD-10-CM | POA: Diagnosis not present

## 2016-08-13 DIAGNOSIS — C187 Malignant neoplasm of sigmoid colon: Secondary | ICD-10-CM

## 2016-08-13 MED ORDER — HEPARIN SOD (PORK) LOCK FLUSH 100 UNIT/ML IV SOLN
500.0000 [IU] | Freq: Once | INTRAVENOUS | Status: AC | PRN
  Administered 2016-08-13: 500 [IU]
  Filled 2016-08-13: qty 5

## 2016-08-13 MED ORDER — SODIUM CHLORIDE 0.9% FLUSH
10.0000 mL | INTRAVENOUS | Status: DC | PRN
  Administered 2016-08-13: 10 mL
  Filled 2016-08-13: qty 10

## 2016-08-13 NOTE — Patient Instructions (Signed)
Implanted Port Home Guide An implanted port is a type of central line that is placed under the skin. Central lines are used to provide IV access when treatment or nutrition needs to be given through a person's veins. Implanted ports are used for long-term IV access. An implanted port may be placed because:  You need IV medicine that would be irritating to the small veins in your hands or arms.  You need long-term IV medicines, such as antibiotics.  You need IV nutrition for a long period.  You need frequent blood draws for lab tests.  You need dialysis.  Implanted ports are usually placed in the chest area, but they can also be placed in the upper arm, the abdomen, or the leg. An implanted port has two main parts:  Reservoir. The reservoir is round and will appear as a small, raised area under your skin. The reservoir is the part where a needle is inserted to give medicines or draw blood.  Catheter. The catheter is a thin, flexible tube that extends from the reservoir. The catheter is placed into a large vein. Medicine that is inserted into the reservoir goes into the catheter and then into the vein.  How will I care for my incision site? Do not get the incision site wet. Bathe or shower as directed by your health care provider. How is my port accessed? Special steps must be taken to access the port:  Before the port is accessed, a numbing cream can be placed on the skin. This helps numb the skin over the port site.  Your health care provider uses a sterile technique to access the port. ? Your health care provider must put on a mask and sterile gloves. ? The skin over your port is cleaned carefully with an antiseptic and allowed to dry. ? The port is gently pinched between sterile gloves, and a needle is inserted into the port.  Only "non-coring" port needles should be used to access the port. Once the port is accessed, a blood return should be checked. This helps ensure that the port  is in the vein and is not clogged.  If your port needs to remain accessed for a constant infusion, a clear (transparent) bandage will be placed over the needle site. The bandage and needle will need to be changed every week, or as directed by your health care provider.  Keep the bandage covering the needle clean and dry. Do not get it wet. Follow your health care provider's instructions on how to take a shower or bath while the port is accessed.  If your port does not need to stay accessed, no bandage is needed over the port.  What is flushing? Flushing helps keep the port from getting clogged. Follow your health care provider's instructions on how and when to flush the port. Ports are usually flushed with saline solution or a medicine called heparin. The need for flushing will depend on how the port is used.  If the port is used for intermittent medicines or blood draws, the port will need to be flushed: ? After medicines have been given. ? After blood has been drawn. ? As part of routine maintenance.  If a constant infusion is running, the port may not need to be flushed.  How long will my port stay implanted? The port can stay in for as long as your health care provider thinks it is needed. When it is time for the port to come out, surgery will be   done to remove it. The procedure is similar to the one performed when the port was put in. When should I seek immediate medical care? When you have an implanted port, you should seek immediate medical care if:  You notice a bad smell coming from the incision site.  You have swelling, redness, or drainage at the incision site.  You have more swelling or pain at the port site or the surrounding area.  You have a fever that is not controlled with medicine.  This information is not intended to replace advice given to you by your health care provider. Make sure you discuss any questions you have with your health care provider. Document  Released: 01/11/2005 Document Revised: 06/19/2015 Document Reviewed: 09/18/2012 Elsevier Interactive Patient Education  2017 Elsevier Inc.  

## 2016-08-17 ENCOUNTER — Telehealth: Payer: Self-pay | Admitting: *Deleted

## 2016-08-17 NOTE — Telephone Encounter (Addendum)
Patient is aware of results  ----- Message from [name], MD sent at 08/17/2016 10:44 AM EDT ----- Please call and let him know that the CEA is still normal. It is 1.49. pete

## 2016-08-24 ENCOUNTER — Ambulatory Visit (HOSPITAL_BASED_OUTPATIENT_CLINIC_OR_DEPARTMENT_OTHER): Payer: BLUE CROSS/BLUE SHIELD

## 2016-08-24 ENCOUNTER — Ambulatory Visit (HOSPITAL_BASED_OUTPATIENT_CLINIC_OR_DEPARTMENT_OTHER): Payer: BLUE CROSS/BLUE SHIELD | Admitting: Hematology & Oncology

## 2016-08-24 ENCOUNTER — Ambulatory Visit: Payer: BLUE CROSS/BLUE SHIELD

## 2016-08-24 ENCOUNTER — Other Ambulatory Visit: Payer: Self-pay | Admitting: Family

## 2016-08-24 ENCOUNTER — Other Ambulatory Visit (HOSPITAL_BASED_OUTPATIENT_CLINIC_OR_DEPARTMENT_OTHER): Payer: BLUE CROSS/BLUE SHIELD

## 2016-08-24 VITALS — BP 126/92 | HR 83 | Temp 98.3°F | Resp 16 | Wt 256.0 lb

## 2016-08-24 DIAGNOSIS — C772 Secondary and unspecified malignant neoplasm of intra-abdominal lymph nodes: Principal | ICD-10-CM

## 2016-08-24 DIAGNOSIS — C187 Malignant neoplasm of sigmoid colon: Secondary | ICD-10-CM

## 2016-08-24 DIAGNOSIS — C19 Malignant neoplasm of rectosigmoid junction: Secondary | ICD-10-CM | POA: Diagnosis not present

## 2016-08-24 DIAGNOSIS — Z5111 Encounter for antineoplastic chemotherapy: Secondary | ICD-10-CM

## 2016-08-24 DIAGNOSIS — R110 Nausea: Secondary | ICD-10-CM

## 2016-08-24 LAB — CMP (CANCER CENTER ONLY)
ALK PHOS: 68 U/L (ref 26–84)
ALT: 38 U/L (ref 10–47)
AST: 36 U/L (ref 11–38)
Albumin: 4 g/dL (ref 3.3–5.5)
BILIRUBIN TOTAL: 1.2 mg/dL (ref 0.20–1.60)
BUN, Bld: 10 mg/dL (ref 7–22)
CO2: 30 mEq/L (ref 18–33)
CREATININE: 1 mg/dL (ref 0.6–1.2)
Calcium: 9.6 mg/dL (ref 8.0–10.3)
Chloride: 110 mEq/L — ABNORMAL HIGH (ref 98–108)
Glucose, Bld: 118 mg/dL (ref 73–118)
Potassium: 3.3 mEq/L (ref 3.3–4.7)
SODIUM: 142 meq/L (ref 128–145)
TOTAL PROTEIN: 6.8 g/dL (ref 6.4–8.1)

## 2016-08-24 LAB — CBC WITH DIFFERENTIAL (CANCER CENTER ONLY)
BASO#: 0 10*3/uL (ref 0.0–0.2)
BASO%: 0.6 % (ref 0.0–2.0)
EOS ABS: 0 10*3/uL (ref 0.0–0.5)
EOS%: 1.3 % (ref 0.0–7.0)
HCT: 35.5 % — ABNORMAL LOW (ref 38.7–49.9)
HEMOGLOBIN: 12.5 g/dL — AB (ref 13.0–17.1)
LYMPH#: 0.5 10*3/uL — ABNORMAL LOW (ref 0.9–3.3)
LYMPH%: 14.2 % (ref 14.0–48.0)
MCH: 31.6 pg (ref 28.0–33.4)
MCHC: 35.2 g/dL (ref 32.0–35.9)
MCV: 90 fL (ref 82–98)
MONO#: 0.3 10*3/uL (ref 0.1–0.9)
MONO%: 9.8 % (ref 0.0–13.0)
NEUT%: 74.1 % (ref 40.0–80.0)
NEUTROS ABS: 2.4 10*3/uL (ref 1.5–6.5)
PLATELETS: 206 10*3/uL (ref 145–400)
RBC: 3.95 10*6/uL — AB (ref 4.20–5.70)
RDW: 16.4 % — ABNORMAL HIGH (ref 11.1–15.7)
WBC: 3.2 10*3/uL — AB (ref 4.0–10.0)

## 2016-08-24 LAB — LACTATE DEHYDROGENASE: LDH: 221 U/L (ref 125–245)

## 2016-08-24 MED ORDER — ATROPINE SULFATE 1 MG/ML IJ SOLN
INTRAMUSCULAR | Status: AC
  Filled 2016-08-24: qty 1

## 2016-08-24 MED ORDER — PALONOSETRON HCL INJECTION 0.25 MG/5ML
0.2500 mg | Freq: Once | INTRAVENOUS | Status: AC
  Administered 2016-08-24: 0.25 mg via INTRAVENOUS

## 2016-08-24 MED ORDER — LORAZEPAM 0.5 MG PO TABS
0.5000 mg | ORAL_TABLET | Freq: Once | ORAL | Status: AC
  Administered 2016-08-24: 0.5 mg via ORAL

## 2016-08-24 MED ORDER — PALONOSETRON HCL INJECTION 0.25 MG/5ML
INTRAVENOUS | Status: AC
  Filled 2016-08-24: qty 5

## 2016-08-24 MED ORDER — SODIUM CHLORIDE 0.9% FLUSH
10.0000 mL | INTRAVENOUS | Status: DC | PRN
  Filled 2016-08-24: qty 10

## 2016-08-24 MED ORDER — HEPARIN SOD (PORK) LOCK FLUSH 100 UNIT/ML IV SOLN
250.0000 [IU] | Freq: Once | INTRAVENOUS | Status: DC | PRN
  Filled 2016-08-24: qty 5

## 2016-08-24 MED ORDER — ALTEPLASE 2 MG IJ SOLR
2.0000 mg | Freq: Once | INTRAMUSCULAR | Status: DC | PRN
  Filled 2016-08-24: qty 2

## 2016-08-24 MED ORDER — LORAZEPAM 1 MG PO TABS
ORAL_TABLET | ORAL | Status: AC
  Filled 2016-08-24: qty 1

## 2016-08-24 MED ORDER — SODIUM CHLORIDE 0.9% FLUSH
3.0000 mL | INTRAVENOUS | Status: DC | PRN
  Filled 2016-08-24: qty 10

## 2016-08-24 MED ORDER — DEXTROSE 5 % IV SOLN
144.0000 mg/m2 | Freq: Once | INTRAVENOUS | Status: AC
  Administered 2016-08-24: 360 mg via INTRAVENOUS
  Filled 2016-08-24: qty 15

## 2016-08-24 MED ORDER — HEPARIN SOD (PORK) LOCK FLUSH 100 UNIT/ML IV SOLN
500.0000 [IU] | Freq: Once | INTRAVENOUS | Status: DC | PRN
  Filled 2016-08-24: qty 5

## 2016-08-24 MED ORDER — DEXAMETHASONE SODIUM PHOSPHATE 10 MG/ML IJ SOLN
INTRAMUSCULAR | Status: AC
  Filled 2016-08-24: qty 1

## 2016-08-24 MED ORDER — LEUCOVORIN CALCIUM INJECTION 350 MG
400.0000 mg/m2 | Freq: Once | INTRAVENOUS | Status: AC
  Administered 2016-08-24: 980 mg via INTRAVENOUS
  Filled 2016-08-24: qty 49

## 2016-08-24 MED ORDER — DEXAMETHASONE SODIUM PHOSPHATE 10 MG/ML IJ SOLN
10.0000 mg | Freq: Once | INTRAMUSCULAR | Status: AC
  Administered 2016-08-24: 10 mg via INTRAVENOUS

## 2016-08-24 MED ORDER — SODIUM CHLORIDE 0.9 % IV SOLN
2160.0000 mg/m2 | INTRAVENOUS | Status: DC
  Administered 2016-08-24: 5300 mg via INTRAVENOUS
  Filled 2016-08-24: qty 100

## 2016-08-24 MED ORDER — FLUOROURACIL CHEMO INJECTION 2.5 GM/50ML
400.0000 mg/m2 | Freq: Once | INTRAVENOUS | Status: AC
  Administered 2016-08-24: 1000 mg via INTRAVENOUS
  Filled 2016-08-24: qty 20

## 2016-08-24 MED ORDER — SODIUM CHLORIDE 0.9 % IV SOLN
Freq: Once | INTRAVENOUS | Status: AC
  Administered 2016-08-24: 11:00:00 via INTRAVENOUS

## 2016-08-24 MED ORDER — ATROPINE SULFATE 1 MG/ML IJ SOLN
0.5000 mg | Freq: Once | INTRAMUSCULAR | Status: AC | PRN
  Administered 2016-08-24: 0.5 mg via INTRAVENOUS

## 2016-08-24 NOTE — Progress Notes (Signed)
Hematology and Oncology Follow Up Visit  Vincent Strickland [id number] 1966/01/04 51 y.o. 08/24/2016   Principle Diagnosis:  Stage IIIC ([id number]) adenocarcinoma of the upper rectum  Current Therapy:   FOLFOX s/p cycle #7 - completed on 04/21/2016 Radiation with Xeloda - started on 05/10/2016 FOLFIRI - to complete all of his chemotherapy today    Interim History:  Vincent Strickland is here today for follow-up. He is using a walking cane. He has a little bit of unsteadiness. I'm sure this is from neuropathy. He says that it probably is about the same. It is more so in his feet than in his hands.  His last CEA was 1.49. He is worried about this. I try to reassure him that this is still very normal and that there will be fluctuations.  He is eating well. He's had no nausea or vomiting. He's had no change in bowel or bladder habits. He's had no diarrhea. He has had no mouth sores.  He has had no rashes.  Overall, his performance status is ECOG 1.  Medications:  Allergies as of 08/24/2016      Reactions   Niaspan [niacin]    Severe flushing   Percocet [oxycodone-acetaminophen] Nausea Only   hallucinations      Medication List       Accurate as of 08/24/16 10:22 AM. Always use your most recent med list.          acetaminophen 500 MG tablet Commonly known as:  TYLENOL Take 1,000 mg by mouth every 6 (six) hours as needed (for pain.).   acetaminophen-codeine 300-30 MG tablet Commonly known as:  TYLENOL #3 Take 1-2 tablets by mouth every 4 (four) hours as needed for moderate pain.   dexamethasone 4 MG tablet Commonly known as:  DECADRON TAKE 2 TABLETS BY MOUTH EVERY DAY **START THE DAY AFTER CHEMOTHERAPY FOR 2 DAYS. TAKE WITH FOOD   gabapentin 300 MG capsule Commonly known as:  NEURONTIN Take 2 capsules (600 mg total) by mouth at bedtime.   HYDROcodone-acetaminophen 5-325 MG tablet Commonly known as:  NORCO Take 1 tablet by mouth every 4 (four) hours as needed for moderate pain.   losartan-hydrochlorothiazide 100-25 MG tablet Commonly known as:  HYZAAR TAKE 1 TABLET BY MOUTH EVERY DAY   omeprazole 20 MG capsule Commonly known as:  PRILOSEC Take 20 mg by mouth daily.   polyethylene glycol packet Commonly known as:  MIRALAX / GLYCOLAX Take 17 g by mouth 2 (two) times daily.   potassium chloride SA 20 MEQ tablet Commonly known as:  K-DUR,KLOR-CON Take 2 tablets (40 mEq total) by mouth 2 (two) times daily.   saccharomyces boulardii 250 MG capsule Commonly known as:  FLORASTOR Take 1 capsule (250 mg total) by mouth 2 (two) times daily.   vitamin B-6 250 MG tablet Take 2 tablets (500 mg total) by mouth daily.       Allergies:  Allergies  Allergen Reactions  . Niaspan [Niacin]     Severe flushing  . Percocet [Oxycodone-Acetaminophen] Nausea Only    hallucinations    Past Medical History, Surgical history, Social history, and Family History were reviewed and updated.  Review of Systems: All other 10 point review of systems is negative.   Physical Exam:  weight is 256 lb (116.1 kg). His oral temperature is 98.3 F (36.8 C). His blood pressure is 126/92 (abnormal) and his pulse is 83. His respiration is 16 and oxygen saturation is 98%.   Wt Readings from Last 3 Encounters:  08/24/16 256 lb (116.1 kg)  08/11/16 258 lb (117 kg)  07/29/16 256 lb 6.4 oz (116.3 kg)    Head and neck exam shows no ocular or oral lesions. He has no scleral icterus. There is no conjunctival pallor. There is no adenopathy in the neck. Lungs are clear bilaterally. Cardiac exam regular rate and rhythm with no murmurs, rubs or bruits. Abdomen is soft. His laparoscopy scar is well-healed. He has no fluid wave. There is no guarding or rebound tenderness. Maybe some slight tenderness in the left lower quadrant. He has no palpable liver or spleen tip. Back exam shows no tenderness over the spine, ribs or hips. Extremities shows no clubbing, cyanosis or edema. He has good range of  motion of his joints. Skin exam shows no rashes, ecchymosis or petechia. He does have several hyperpigmented lesions which do not appear to be suspicious. Neurological exam shows no focal neurological deficits.   Lab Results  Component Value Date   WBC 3.2 (L) 08/24/2016   HGB 12.5 (L) 08/24/2016   HCT 35.5 (L) 08/24/2016   MCV 90 08/24/2016   PLT 206 08/24/2016   Lab Results  Component Value Date   FERRITIN 34 01/13/2016   IRON 41 (L) 01/13/2016   TIBC 369 01/13/2016   UIBC 328 01/13/2016   IRONPCTSAT 11 (L) 01/13/2016   Lab Results  Component Value Date   RBC 3.95 (L) 08/24/2016   No results found for: KPAFRELGTCHN, LAMBDASER, KAPLAMBRATIO No results found for: IGGSERUM, IGA, IGMSERUM No results found for: Odetta Pink, SPEI   Chemistry      Component Value Date/Time   NA 142 08/24/2016 0836   K 3.3 08/24/2016 0836   CL 110 (H) 08/24/2016 0836   CO2 30 08/24/2016 0836   BUN 10 08/24/2016 0836   CREATININE 1.0 08/24/2016 0836      Component Value Date/Time   CALCIUM 9.6 08/24/2016 0836   ALKPHOS 68 08/24/2016 0836   AST 36 08/24/2016 0836   ALT 38 08/24/2016 0836   BILITOT 1.20 08/24/2016 0836     Impression and Plan: Vincent Strickland is a pleasant 51 yo gentleman with 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   He will now finish up his chemotherapy. He has done a great job. I truly hope that the neuropathy will improve.  We will follow-up in 6 weeks. We will do a CT scan we see him back.  He has a Port-A-Cath in. If everything looks okay when we see him back, we will see about having this removed. It is causing him some discomfort.   I will continue to pray for his family.   [name], MD 7/31/201810:22 AM

## 2016-08-24 NOTE — Patient Instructions (Signed)
[facility] Discharge Instructions for Patients Receiving Chemotherapy  Today you received the following chemotherapy agents Leucovorin, Irinotecan and 5FU  To help prevent nausea and vomiting after your treatment, we encourage you to take your nausea medication as prescribed by MD.   If you develop nausea and vomiting that is not controlled by your nausea medication, call the clinic.   BELOW ARE SYMPTOMS THAT SHOULD BE REPORTED IMMEDIATELY:  *FEVER GREATER THAN 100.5 F  *CHILLS WITH OR WITHOUT FEVER  NAUSEA AND VOMITING THAT IS NOT CONTROLLED WITH YOUR NAUSEA MEDICATION  *UNUSUAL SHORTNESS OF BREATH  *UNUSUAL BRUISING OR BLEEDING  TENDERNESS IN MOUTH AND THROAT WITH OR WITHOUT PRESENCE OF ULCERS  *URINARY PROBLEMS  *BOWEL PROBLEMS  UNUSUAL RASH Items with * indicate a potential emergency and should be followed up as soon as possible.  Feel free to call the clinic you have any questions or concerns. The clinic phone number is [phone].  Please show the [facility] at check-in to the Emergency Department and triage nurse.  Irinotecan injection What is this medicine? IRINOTECAN (ir in oh TEE kan ) is a chemotherapy drug. It is used to treat colon and rectal cancer. This medicine may be used for other purposes; ask your health care provider or pharmacist if you have questions. COMMON BRAND NAME(S): Camptosar What should I tell my health care provider before I take this medicine? They need to know if you have any of these conditions: -blood disorders -dehydration -diarrhea -infection (especially a virus infection such as chickenpox, cold sores, or herpes) -liver disease -low blood counts, like low white cell, platelet, or red cell counts -recent or ongoing radiation therapy -an unusual or allergic reaction to irinotecan, sorbitol, other chemotherapy, other medicines, foods, dyes, or preservatives -pregnant or trying to get  pregnant -breast-feeding How should I use this medicine? This drug is given as an infusion into a vein. It is administered in a hospital or clinic by a specially trained health care professional. Talk to your pediatrician regarding the use of this medicine in children. Special care may be needed. Overdosage: If you think you have taken too much of this medicine contact a poison control center or emergency room at once. NOTE: This medicine is only for you. Do not share this medicine with others. What if I miss a dose? It is important not to miss your dose. Call your doctor or health care professional if you are unable to keep an appointment. What may interact with this medicine? Do not take this medicine with any of the following medications: -atazanavir -certain medicines for fungal infections like itraconazole and ketoconazole -St. John's Wort This medicine may also interact with the following medications: -dexamethasone -diuretics -laxatives -medicines for seizures like carbamazepine, mephobarbital, phenobarbital, phenytoin, primidone -medicines to increase blood counts like filgrastim, pegfilgrastim, sargramostim -prochlorperazine -vaccines This list may not describe all possible interactions. Give your health care provider a list of all the medicines, herbs, non-prescription drugs, or dietary supplements you use. Also tell them if you smoke, drink alcohol, or use illegal drugs. Some items may interact with your medicine. What should I watch for while using this medicine? Your condition will be monitored carefully while you are receiving this medicine. You will need important blood work done while you are taking this medicine. This drug may make you feel generally unwell. This is not uncommon, as chemotherapy can affect healthy cells as well as cancer cells. Report any side effects. Continue your course of treatment  even though you feel ill unless your doctor tells you to stop. In some  cases, you may be given additional medicines to help with side effects. Follow all directions for their use. You may get drowsy or dizzy. Do not drive, use machinery, or do anything that needs mental alertness until you know how this medicine affects you. Do not stand or sit up quickly, especially if you are an older patient. This reduces the risk of dizzy or fainting spells. Call your doctor or health care professional for advice if you get a fever, chills or sore throat, or other symptoms of a cold or flu. Do not treat yourself. This drug decreases your body's ability to fight infections. Try to avoid being around people who are sick. This medicine may increase your risk to bruise or bleed. Call your doctor or health care professional if you notice any unusual bleeding. Be careful brushing and flossing your teeth or using a toothpick because you may get an infection or bleed more easily. If you have any dental work done, tell your dentist you are receiving this medicine. Avoid taking products that contain aspirin, acetaminophen, ibuprofen, naproxen, or ketoprofen unless instructed by your doctor. These medicines may hide a fever. Do not become pregnant while taking this medicine. Women should inform their doctor if they wish to become pregnant or think they might be pregnant. There is a potential for serious side effects to an unborn child. Talk to your health care professional or pharmacist for more information. Do not breast-feed an infant while taking this medicine. What side effects may I notice from receiving this medicine? Side effects that you should report to your doctor or health care professional as soon as possible: -allergic reactions like skin rash, itching or hives, swelling of the face, lips, or tongue -low blood counts - this medicine may decrease the number of white blood cells, red blood cells and platelets. You may be at increased risk for infections and bleeding. -signs of infection  - fever or chills, cough, sore throat, pain or difficulty passing urine -signs of decreased platelets or bleeding - bruising, pinpoint red spots on the skin, black, tarry stools, blood in the urine -signs of decreased red blood cells - unusually weak or tired, fainting spells, lightheadedness -breathing problems -chest pain -diarrhea -feeling faint or lightheaded, falls -flushing, runny nose, sweating during infusion -mouth sores or pain -pain, swelling, redness or irritation where injected -pain, swelling, warmth in the leg -pain, tingling, numbness in the hands or feet -problems with balance, talking, walking -stomach cramps, pain -trouble passing urine or change in the amount of urine -vomiting as to be unable to hold down drinks or food -yellowing of the eyes or skin Side effects that usually do not require medical attention (report to your doctor or health care professional if they continue or are bothersome): -constipation -hair loss -headache -loss of appetite -nausea, vomiting -stomach upset This list may not describe all possible side effects. Call your doctor for medical advice about side effects. You may report side effects to FDA at 1-800-FDA-1088. Where should I keep my medicine? This drug is given in a hospital or clinic and will not be stored at home. NOTE: This sheet is a summary. It may not cover all possible information. If you have questions about this medicine, talk to your doctor, pharmacist, or health care provider.  2018 [employer] Standard (2012-07-10 16:29:32)

## 2016-08-25 ENCOUNTER — Ambulatory Visit: Payer: Self-pay

## 2016-08-25 ENCOUNTER — Ambulatory Visit: Payer: Self-pay | Admitting: Hematology & Oncology

## 2016-08-25 ENCOUNTER — Other Ambulatory Visit: Payer: Self-pay

## 2016-08-26 ENCOUNTER — Ambulatory Visit (HOSPITAL_BASED_OUTPATIENT_CLINIC_OR_DEPARTMENT_OTHER): Payer: BLUE CROSS/BLUE SHIELD

## 2016-08-26 VITALS — BP 138/74 | HR 79 | Temp 98.1°F | Resp 20

## 2016-08-26 DIAGNOSIS — C187 Malignant neoplasm of sigmoid colon: Secondary | ICD-10-CM | POA: Diagnosis not present

## 2016-08-26 DIAGNOSIS — C772 Secondary and unspecified malignant neoplasm of intra-abdominal lymph nodes: Principal | ICD-10-CM

## 2016-08-26 MED ORDER — SODIUM CHLORIDE 0.9% FLUSH
10.0000 mL | INTRAVENOUS | Status: DC | PRN
  Administered 2016-08-26: 10 mL
  Filled 2016-08-26: qty 10

## 2016-08-26 MED ORDER — HEPARIN SOD (PORK) LOCK FLUSH 100 UNIT/ML IV SOLN
500.0000 [IU] | Freq: Once | INTRAVENOUS | Status: AC | PRN
  Administered 2016-08-26: 500 [IU]
  Filled 2016-08-26: qty 5

## 2016-08-26 NOTE — Patient Instructions (Signed)
Implanted Port Home Guide An implanted port is a type of central line that is placed under the skin. Central lines are used to provide IV access when treatment or nutrition needs to be given through a person's veins. Implanted ports are used for long-term IV access. An implanted port may be placed because:  You need IV medicine that would be irritating to the small veins in your hands or arms.  You need long-term IV medicines, such as antibiotics.  You need IV nutrition for a long period.  You need frequent blood draws for lab tests.  You need dialysis.  Implanted ports are usually placed in the chest area, but they can also be placed in the upper arm, the abdomen, or the leg. An implanted port has two main parts:  Reservoir. The reservoir is round and will appear as a small, raised area under your skin. The reservoir is the part where a needle is inserted to give medicines or draw blood.  Catheter. The catheter is a thin, flexible tube that extends from the reservoir. The catheter is placed into a large vein. Medicine that is inserted into the reservoir goes into the catheter and then into the vein.  How will I care for my incision site? Do not get the incision site wet. Bathe or shower as directed by your health care provider. How is my port accessed? Special steps must be taken to access the port:  Before the port is accessed, a numbing cream can be placed on the skin. This helps numb the skin over the port site.  Your health care provider uses a sterile technique to access the port. ? Your health care provider must put on a mask and sterile gloves. ? The skin over your port is cleaned carefully with an antiseptic and allowed to dry. ? The port is gently pinched between sterile gloves, and a needle is inserted into the port.  Only "non-coring" port needles should be used to access the port. Once the port is accessed, a blood return should be checked. This helps ensure that the port  is in the vein and is not clogged.  If your port needs to remain accessed for a constant infusion, a clear (transparent) bandage will be placed over the needle site. The bandage and needle will need to be changed every week, or as directed by your health care provider.  Keep the bandage covering the needle clean and dry. Do not get it wet. Follow your health care provider's instructions on how to take a shower or bath while the port is accessed.  If your port does not need to stay accessed, no bandage is needed over the port.  What is flushing? Flushing helps keep the port from getting clogged. Follow your health care provider's instructions on how and when to flush the port. Ports are usually flushed with saline solution or a medicine called heparin. The need for flushing will depend on how the port is used.  If the port is used for intermittent medicines or blood draws, the port will need to be flushed: ? After medicines have been given. ? After blood has been drawn. ? As part of routine maintenance.  If a constant infusion is running, the port may not need to be flushed.  How long will my port stay implanted? The port can stay in for as long as your health care provider thinks it is needed. When it is time for the port to come out, surgery will be   done to remove it. The procedure is similar to the one performed when the port was put in. When should I seek immediate medical care? When you have an implanted port, you should seek immediate medical care if:  You notice a bad smell coming from the incision site.  You have swelling, redness, or drainage at the incision site.  You have more swelling or pain at the port site or the surrounding area.  You have a fever that is not controlled with medicine.  This information is not intended to replace advice given to you by your health care provider. Make sure you discuss any questions you have with your health care provider. Document  Released: 01/11/2005 Document Revised: 06/19/2015 Document Reviewed: 09/18/2012 Elsevier Interactive Patient Education  2017 Elsevier Inc.  

## 2016-09-22 ENCOUNTER — Other Ambulatory Visit: Payer: Self-pay | Admitting: Hematology & Oncology

## 2016-09-22 DIAGNOSIS — Z85038 Personal history of other malignant neoplasm of large intestine: Secondary | ICD-10-CM

## 2016-09-27 ENCOUNTER — Other Ambulatory Visit: Payer: Self-pay | Admitting: Hematology & Oncology

## 2016-09-27 DIAGNOSIS — Z85038 Personal history of other malignant neoplasm of large intestine: Secondary | ICD-10-CM

## 2016-09-27 DIAGNOSIS — E876 Hypokalemia: Secondary | ICD-10-CM

## 2016-09-29 ENCOUNTER — Encounter: Payer: Self-pay | Admitting: *Deleted

## 2016-09-29 ENCOUNTER — Other Ambulatory Visit: Payer: Managed Care, Other (non HMO)

## 2016-09-29 ENCOUNTER — Other Ambulatory Visit (HOSPITAL_BASED_OUTPATIENT_CLINIC_OR_DEPARTMENT_OTHER): Payer: Self-pay

## 2016-09-29 ENCOUNTER — Ambulatory Visit: Payer: Managed Care, Other (non HMO) | Admitting: Hematology & Oncology

## 2016-09-29 ENCOUNTER — Other Ambulatory Visit (HOSPITAL_BASED_OUTPATIENT_CLINIC_OR_DEPARTMENT_OTHER): Payer: Managed Care, Other (non HMO)

## 2016-09-29 ENCOUNTER — Ambulatory Visit (HOSPITAL_BASED_OUTPATIENT_CLINIC_OR_DEPARTMENT_OTHER): Payer: Managed Care, Other (non HMO)

## 2016-09-29 VITALS — BP 139/80 | HR 69 | Temp 97.8°F | Resp 20 | Wt 259.5 lb

## 2016-09-29 DIAGNOSIS — C772 Secondary and unspecified malignant neoplasm of intra-abdominal lymph nodes: Principal | ICD-10-CM

## 2016-09-29 DIAGNOSIS — C187 Malignant neoplasm of sigmoid colon: Secondary | ICD-10-CM | POA: Diagnosis not present

## 2016-09-29 DIAGNOSIS — Z95828 Presence of other vascular implants and grafts: Secondary | ICD-10-CM

## 2016-09-29 LAB — CBC WITH DIFFERENTIAL (CANCER CENTER ONLY)
BASO#: 0.1 10*3/uL (ref 0.0–0.2)
BASO%: 1.6 % (ref 0.0–2.0)
EOS%: 3 % (ref 0.0–7.0)
Eosinophils Absolute: 0.1 10*3/uL (ref 0.0–0.5)
HEMATOCRIT: 38.5 % — AB (ref 38.7–49.9)
HGB: 13.6 g/dL (ref 13.0–17.1)
LYMPH#: 0.5 10*3/uL — AB (ref 0.9–3.3)
LYMPH%: 12.5 % — ABNORMAL LOW (ref 14.0–48.0)
MCH: 32.5 pg (ref 28.0–33.4)
MCHC: 35.3 g/dL (ref 32.0–35.9)
MCV: 92 fL (ref 82–98)
MONO#: 0.4 10*3/uL (ref 0.1–0.9)
MONO%: 9.5 % (ref 0.0–13.0)
NEUT#: 3.2 10*3/uL (ref 1.5–6.5)
NEUT%: 73.4 % (ref 40.0–80.0)
Platelets: 226 10*3/uL (ref 145–400)
RBC: 4.18 10*6/uL — ABNORMAL LOW (ref 4.20–5.70)
RDW: 14.1 % (ref 11.1–15.7)
WBC: 4.3 10*3/uL (ref 4.0–10.0)

## 2016-09-29 LAB — CMP (CANCER CENTER ONLY)
ALBUMIN: 3.7 g/dL (ref 3.3–5.5)
ALT(SGPT): 56 U/L — ABNORMAL HIGH (ref 10–47)
AST: 36 U/L (ref 11–38)
Alkaline Phosphatase: 64 U/L (ref 26–84)
BILIRUBIN TOTAL: 0.7 mg/dL (ref 0.20–1.60)
BUN: 11 mg/dL (ref 7–22)
CHLORIDE: 108 meq/L (ref 98–108)
CO2: 29 meq/L (ref 18–33)
CREATININE: 0.9 mg/dL (ref 0.6–1.2)
Calcium: 9.8 mg/dL (ref 8.0–10.3)
GLUCOSE: 148 mg/dL — AB (ref 73–118)
Potassium: 3.4 mEq/L (ref 3.3–4.7)
SODIUM: 142 meq/L (ref 128–145)
Total Protein: 6.8 g/dL (ref 6.4–8.1)

## 2016-09-29 LAB — CEA (IN HOUSE-CHCC): CEA (CHCC-In House): <1 ng/mL (ref 0.00–5.00)

## 2016-09-29 MED ORDER — HEPARIN SOD (PORK) LOCK FLUSH 100 UNIT/ML IV SOLN
500.0000 [IU] | Freq: Once | INTRAVENOUS | Status: AC
  Administered 2016-09-29: 500 [IU] via INTRAVENOUS
  Filled 2016-09-29: qty 5

## 2016-09-29 MED ORDER — SODIUM CHLORIDE 0.9% FLUSH
10.0000 mL | INTRAVENOUS | Status: DC | PRN
  Administered 2016-09-29: 10 mL via INTRAVENOUS
  Filled 2016-09-29: qty 10

## 2016-09-30 ENCOUNTER — Telehealth: Payer: Self-pay

## 2016-09-30 NOTE — Telephone Encounter (Signed)
-----   Message from [name], MD sent at 09/29/2016  6:08 PM EDT ----- Call - CEA is < 1!!!  This is great!!!  pete

## 2016-09-30 NOTE — Telephone Encounter (Signed)
Called and spoke with patient per Dr. Antonieta Pert request.  Recent CT scan looked fine. FYI.

## 2016-10-06 ENCOUNTER — Ambulatory Visit (HOSPITAL_BASED_OUTPATIENT_CLINIC_OR_DEPARTMENT_OTHER): Payer: Managed Care, Other (non HMO) | Admitting: Hematology & Oncology

## 2016-10-06 VITALS — BP 140/96 | HR 85 | Temp 97.8°F | Resp 17 | Wt 260.0 lb

## 2016-10-06 DIAGNOSIS — C187 Malignant neoplasm of sigmoid colon: Secondary | ICD-10-CM | POA: Diagnosis not present

## 2016-10-06 DIAGNOSIS — C772 Secondary and unspecified malignant neoplasm of intra-abdominal lymph nodes: Principal | ICD-10-CM
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
## 2016-10-24 ENCOUNTER — Other Ambulatory Visit: Payer: Self-pay | Admitting: Hematology & Oncology

## 2016-10-24 DIAGNOSIS — Z85038 Personal history of other malignant neoplasm of large intestine: Secondary | ICD-10-CM

## 2016-10-24 DIAGNOSIS — E876 Hypokalemia: Secondary | ICD-10-CM

## 2016-10-30 ENCOUNTER — Ambulatory Visit: Payer: Self-pay | Admitting: Surgery

## 2016-11-18 ENCOUNTER — Encounter (HOSPITAL_BASED_OUTPATIENT_CLINIC_OR_DEPARTMENT_OTHER): Payer: Self-pay | Admitting: *Deleted

## 2016-11-23 ENCOUNTER — Encounter (HOSPITAL_BASED_OUTPATIENT_CLINIC_OR_DEPARTMENT_OTHER)
Admission: RE | Admit: 2016-11-23 | Discharge: 2016-11-23 | Disposition: A | Discharge status: Home / Self Care | Payer: Managed Care, Other (non HMO) | Source: Ambulatory Visit | Attending: Surgery | Admitting: Surgery

## 2016-11-23 DIAGNOSIS — Z8249 Family history of ischemic heart disease and other diseases of the circulatory system: Secondary | ICD-10-CM | POA: Diagnosis not present

## 2016-11-23 DIAGNOSIS — Z823 Family history of stroke: Secondary | ICD-10-CM | POA: Diagnosis not present

## 2016-11-23 DIAGNOSIS — I10 Essential (primary) hypertension: Secondary | ICD-10-CM | POA: Diagnosis not present

## 2016-11-23 DIAGNOSIS — Z8042 Family history of malignant neoplasm of prostate: Secondary | ICD-10-CM | POA: Diagnosis not present

## 2016-11-23 DIAGNOSIS — Z833 Family history of diabetes mellitus: Secondary | ICD-10-CM | POA: Diagnosis not present

## 2016-11-23 DIAGNOSIS — Z800 Family history of malignant neoplasm of digestive organs: Secondary | ICD-10-CM | POA: Diagnosis not present

## 2016-11-23 DIAGNOSIS — Z452 Encounter for adjustment and management of vascular access device: Secondary | ICD-10-CM | POA: Diagnosis present

## 2016-11-23 DIAGNOSIS — Z87440 Personal history of urinary (tract) infections: Secondary | ICD-10-CM | POA: Diagnosis not present

## 2016-11-23 DIAGNOSIS — Z87442 Personal history of urinary calculi: Secondary | ICD-10-CM | POA: Diagnosis not present

## 2016-11-23 DIAGNOSIS — Z820 Family history of epilepsy and other diseases of the nervous system: Secondary | ICD-10-CM | POA: Diagnosis not present

## 2016-11-23 DIAGNOSIS — Z9049 Acquired absence of other specified parts of digestive tract: Secondary | ICD-10-CM | POA: Diagnosis not present

## 2016-11-23 DIAGNOSIS — H8110 Benign paroxysmal vertigo, unspecified ear: Secondary | ICD-10-CM | POA: Diagnosis not present

## 2016-11-23 DIAGNOSIS — Z8041 Family history of malignant neoplasm of ovary: Secondary | ICD-10-CM | POA: Diagnosis not present

## 2016-11-23 DIAGNOSIS — Z923 Personal history of irradiation: Secondary | ICD-10-CM | POA: Diagnosis not present

## 2016-11-23 DIAGNOSIS — Z85038 Personal history of other malignant neoplasm of large intestine: Secondary | ICD-10-CM | POA: Diagnosis not present

## 2016-11-23 DIAGNOSIS — Z836 Family history of other diseases of the respiratory system: Secondary | ICD-10-CM | POA: Diagnosis not present

## 2016-11-23 DIAGNOSIS — Z9889 Other specified postprocedural states: Secondary | ICD-10-CM | POA: Diagnosis not present

## 2016-11-23 LAB — BASIC METABOLIC PANEL
ANION GAP: 8 (ref 5–15)
BUN: 10 mg/dL (ref 6–20)
CO2: 27 mmol/L (ref 22–32)
Calcium: 9.3 mg/dL (ref 8.9–10.3)
Chloride: 106 mmol/L (ref 101–111)
Creatinine, Ser: 0.92 mg/dL (ref 0.61–1.24)
GLUCOSE: 106 mg/dL — AB (ref 65–99)
POTASSIUM: 3.4 mmol/L — AB (ref 3.5–5.1)
SODIUM: 141 mmol/L (ref 135–145)

## 2016-11-26 ENCOUNTER — Encounter (HOSPITAL_BASED_OUTPATIENT_CLINIC_OR_DEPARTMENT_OTHER)
Admission: RE | Disposition: A | Discharge status: Home / Self Care | Payer: Self-pay | Source: Ambulatory Visit | Attending: Surgery

## 2016-11-26 ENCOUNTER — Ambulatory Visit (HOSPITAL_BASED_OUTPATIENT_CLINIC_OR_DEPARTMENT_OTHER): Payer: Managed Care, Other (non HMO) | Admitting: Certified Registered"

## 2016-11-26 ENCOUNTER — Ambulatory Visit (HOSPITAL_BASED_OUTPATIENT_CLINIC_OR_DEPARTMENT_OTHER)
Admission: RE | Admit: 2016-11-26 | Discharge: 2016-11-26 | Disposition: A | Discharge status: Home / Self Care | Payer: Managed Care, Other (non HMO) | Source: Ambulatory Visit | Attending: Surgery | Admitting: Surgery

## 2016-11-26 ENCOUNTER — Encounter (HOSPITAL_BASED_OUTPATIENT_CLINIC_OR_DEPARTMENT_OTHER): Payer: Self-pay

## 2016-11-26 DIAGNOSIS — Z85038 Personal history of other malignant neoplasm of large intestine: Secondary | ICD-10-CM | POA: Insufficient documentation

## 2016-11-26 DIAGNOSIS — Z87442 Personal history of urinary calculi: Secondary | ICD-10-CM | POA: Insufficient documentation

## 2016-11-26 DIAGNOSIS — Z923 Personal history of irradiation: Secondary | ICD-10-CM | POA: Insufficient documentation

## 2016-11-26 DIAGNOSIS — Z452 Encounter for adjustment and management of vascular access device: Secondary | ICD-10-CM | POA: Diagnosis not present

## 2016-11-26 DIAGNOSIS — Z87440 Personal history of urinary (tract) infections: Secondary | ICD-10-CM | POA: Insufficient documentation

## 2016-11-26 DIAGNOSIS — Z8042 Family history of malignant neoplasm of prostate: Secondary | ICD-10-CM | POA: Insufficient documentation

## 2016-11-26 DIAGNOSIS — Z823 Family history of stroke: Secondary | ICD-10-CM | POA: Insufficient documentation

## 2016-11-26 DIAGNOSIS — Z836 Family history of other diseases of the respiratory system: Secondary | ICD-10-CM | POA: Insufficient documentation

## 2016-11-26 DIAGNOSIS — Z8249 Family history of ischemic heart disease and other diseases of the circulatory system: Secondary | ICD-10-CM | POA: Insufficient documentation

## 2016-11-26 DIAGNOSIS — H8110 Benign paroxysmal vertigo, unspecified ear: Secondary | ICD-10-CM | POA: Insufficient documentation

## 2016-11-26 DIAGNOSIS — Z820 Family history of epilepsy and other diseases of the nervous system: Secondary | ICD-10-CM | POA: Insufficient documentation

## 2016-11-26 DIAGNOSIS — Z9049 Acquired absence of other specified parts of digestive tract: Secondary | ICD-10-CM | POA: Insufficient documentation

## 2016-11-26 DIAGNOSIS — I10 Essential (primary) hypertension: Secondary | ICD-10-CM | POA: Insufficient documentation

## 2016-11-26 DIAGNOSIS — Z8041 Family history of malignant neoplasm of ovary: Secondary | ICD-10-CM | POA: Insufficient documentation

## 2016-11-26 DIAGNOSIS — Z9889 Other specified postprocedural states: Secondary | ICD-10-CM | POA: Insufficient documentation

## 2016-11-26 DIAGNOSIS — Z800 Family history of malignant neoplasm of digestive organs: Secondary | ICD-10-CM | POA: Insufficient documentation

## 2016-11-26 DIAGNOSIS — Z833 Family history of diabetes mellitus: Secondary | ICD-10-CM | POA: Insufficient documentation

## 2016-11-26 HISTORY — PX: PORT-A-CATH REMOVAL: SHX5289

## 2016-11-26 SURGERY — REMOVAL PORT-A-CATH
Anesthesia: Monitor Anesthesia Care | Site: Chest

## 2016-11-26 MED ORDER — CHLORHEXIDINE GLUCONATE CLOTH 2 % EX PADS: 6.0000 | MEDICATED_PAD | Freq: Once | CUTANEOUS | Status: DC

## 2016-11-26 MED ORDER — MIDAZOLAM HCL 2 MG/2ML IJ SOLN
1.0000 mg | INTRAMUSCULAR | Status: DC | PRN
  Administered 2016-11-26: 2 mg via INTRAVENOUS

## 2016-11-26 MED ORDER — ONDANSETRON HCL 4 MG/2ML IJ SOLN
INTRAMUSCULAR | Status: DC | PRN
  Administered 2016-11-26: 4 mg via INTRAVENOUS

## 2016-11-26 MED ORDER — MIDAZOLAM HCL 2 MG/2ML IJ SOLN
INTRAMUSCULAR | Status: AC
  Filled 2016-11-26: qty 2

## 2016-11-26 MED ORDER — GLYCOPYRROLATE 0.2 MG/ML IJ SOLN
INTRAMUSCULAR | Status: DC | PRN
  Administered 2016-11-26: 0.2 mg via INTRAVENOUS

## 2016-11-26 MED ORDER — CEFAZOLIN SODIUM-DEXTROSE 2-4 GM/100ML-% IV SOLN
INTRAVENOUS | Status: AC
  Filled 2016-11-26: qty 100

## 2016-11-26 MED ORDER — PROPOFOL 500 MG/50ML IV EMUL
INTRAVENOUS | Status: AC
  Filled 2016-11-26: qty 50

## 2016-11-26 MED ORDER — SCOPOLAMINE 1 MG/3DAYS TD PT72: 1.0000 | MEDICATED_PATCH | Freq: Once | TRANSDERMAL | Status: DC | PRN

## 2016-11-26 MED ORDER — FENTANYL CITRATE (PF) 100 MCG/2ML IJ SOLN: 25.0000 ug | INTRAMUSCULAR | Status: DC | PRN

## 2016-11-26 MED ORDER — LACTATED RINGERS IV SOLN
INTRAVENOUS | Status: DC
  Administered 2016-11-26: 08:00:00 via INTRAVENOUS

## 2016-11-26 MED ORDER — PROPOFOL 500 MG/50ML IV EMUL
INTRAVENOUS | Status: DC | PRN
  Administered 2016-11-26: 175 ug/kg/min via INTRAVENOUS

## 2016-11-26 MED ORDER — FENTANYL CITRATE (PF) 100 MCG/2ML IJ SOLN
INTRAMUSCULAR | Status: AC
  Filled 2016-11-26: qty 2

## 2016-11-26 MED ORDER — CEFAZOLIN SODIUM-DEXTROSE 2-4 GM/100ML-% IV SOLN
2.0000 g | INTRAVENOUS | Status: AC
  Administered 2016-11-26: 2 g via INTRAVENOUS

## 2016-11-26 MED ORDER — FENTANYL CITRATE (PF) 100 MCG/2ML IJ SOLN
50.0000 ug | INTRAMUSCULAR | Status: DC | PRN
  Administered 2016-11-26: 50 ug via INTRAVENOUS

## 2016-11-26 MED ORDER — ONDANSETRON HCL 4 MG/2ML IJ SOLN: 4.0000 mg | Freq: Four times a day (QID) | INTRAMUSCULAR | Status: DC | PRN

## 2016-11-26 MED ORDER — ONDANSETRON HCL 4 MG/2ML IJ SOLN
INTRAMUSCULAR | Status: AC
  Filled 2016-11-26: qty 2

## 2016-11-26 MED ORDER — SODIUM BICARBONATE 4 % IV SOLN
INTRAVENOUS | Status: DC | PRN
  Administered 2016-11-26: 5 mL via INTRAVENOUS

## 2016-11-26 MED ORDER — LIDOCAINE HCL (PF) 1 % IJ SOLN
INTRAMUSCULAR | Status: DC | PRN
  Administered 2016-11-26: 6 mL

## 2016-11-26 SURGICAL SUPPLY — 37 items
ADH SKN CLS APL DERMABOND .7 (GAUZE/BANDAGES/DRESSINGS)
APL SKNCLS STERI-STRIP NONHPOA (GAUZE/BANDAGES/DRESSINGS)
BENZOIN TINCTURE PRP APPL 2/3 (GAUZE/BANDAGES/DRESSINGS) IMPLANT
BLADE SURG 15 STRL LF DISP TIS (BLADE) ×1 IMPLANT
BLADE SURG 15 STRL SS (BLADE) ×2
CANISTER SUCT 1200ML W/VALVE (MISCELLANEOUS) IMPLANT
CLEANER CAUTERY TIP 5X5 PAD (MISCELLANEOUS) IMPLANT
COVER BACK TABLE 60X90IN (DRAPES) ×2 IMPLANT
COVER MAYO STAND STRL (DRAPES) ×2 IMPLANT
DECANTER SPIKE VIAL GLASS SM (MISCELLANEOUS) ×2 IMPLANT
DERMABOND ADVANCED (GAUZE/BANDAGES/DRESSINGS)
DERMABOND ADVANCED .7 DNX12 (GAUZE/BANDAGES/DRESSINGS) IMPLANT
DRAPE LAPAROTOMY 100X72 PEDS (DRAPES) ×2 IMPLANT
ELECT REM PT RETURN 9FT ADLT (ELECTROSURGICAL) ×2
ELECTRODE REM PT RTRN 9FT ADLT (ELECTROSURGICAL) ×1 IMPLANT
GAUZE SPONGE 4X4 12PLY STRL LF (GAUZE/BANDAGES/DRESSINGS) ×2 IMPLANT
GLOVE BIO SURGEON STRL SZ8 (GLOVE) ×2 IMPLANT
GOWN STRL REUS W/ TWL LRG LVL3 (GOWN DISPOSABLE) ×1 IMPLANT
GOWN STRL REUS W/ TWL XL LVL3 (GOWN DISPOSABLE) ×1 IMPLANT
GOWN STRL REUS W/TWL LRG LVL3 (GOWN DISPOSABLE) ×2
GOWN STRL REUS W/TWL XL LVL3 (GOWN DISPOSABLE) ×2
NDL HYPO 25X1 1.5 SAFETY (NEEDLE) ×1 IMPLANT
NEEDLE HYPO 25X1 1.5 SAFETY (NEEDLE) ×2 IMPLANT
PACK BASIN DAY SURGERY FS (CUSTOM PROCEDURE TRAY) ×2 IMPLANT
PAD CLEANER CAUTERY TIP 5X5 (MISCELLANEOUS)
PENCIL BUTTON HOLSTER BLD 10FT (ELECTRODE) ×2 IMPLANT
SLEEVE SCD COMPRESS KNEE MED (MISCELLANEOUS) IMPLANT
STRIP CLOSURE SKIN 1/2X4 (GAUZE/BANDAGES/DRESSINGS) IMPLANT
SUT VIC AB 4-0 SH 18 (SUTURE) ×2 IMPLANT
SUT VIC AB 5-0 P-3 18X BRD (SUTURE) IMPLANT
SUT VIC AB 5-0 P3 18 (SUTURE)
SYR BULB 3OZ (MISCELLANEOUS) ×2 IMPLANT
SYR CONTROL 10ML LL (SYRINGE) ×2 IMPLANT
TOWEL OR 17X24 6PK STRL BLUE (TOWEL DISPOSABLE) ×2 IMPLANT
TRAY DSU PREP LF (CUSTOM PROCEDURE TRAY) ×2 IMPLANT
TUBE CONNECTING 20X1/4 (TUBING) IMPLANT
YANKAUER SUCT BULB TIP NO VENT (SUCTIONS) IMPLANT

## 2016-11-26 NOTE — Op Note (Signed)
Surgeon: Kaylyn Lim, MD, FACS  Asst:  none  Anes:  MAC with local  Preop Dx: portacath in situ Postop Dx: portacath explanted  Procedure: Explantation of portacath Location Surgery: CDS 7 Complications: none  EBL:   none cc  Drains: none  Description of Procedure:  The patient was taken to OR 7 .  After anesthesia was administered and the patient was prepped a timeout was performed.  The prep was betadine because of a possible reaction to chlorohexidine.  The old scar was infiltrated with 1% lidocaine and an incision was made down to the port.  The stem was mobilized first and the catheter removed from the vein and it came easily and appeared to be intact.  The two prolene sutures were cut and the entire port was explanted, washed and placed in a sterile cup because he had signed a release to keep his port.  The wound was closed with 4-0 vicryl and benzoin/steristrips.    The patient tolerated the procedure well and was taken to the PACU in stable condition.     Matt B. Hassell Done, [place], [facility], [place]

## 2016-11-26 NOTE — Transfer of Care (Signed)
Immediate Anesthesia Transfer of Care Note  Patient: Vincent Strickland  Procedure(s) Performed: REMOVAL PORT-A-CATH (N/A Chest)  Patient Location: PACU  Anesthesia Type:MAC  Level of Consciousness: awake, alert  and oriented  Airway & Oxygen Therapy: Patient Spontanous Breathing and Patient connected to face mask oxygen  Post-op Assessment: Report given to RN and Post -op Vital signs reviewed and stable  Post vital signs: Reviewed and stable  Last Vitals:  Vitals:   11/26/16 0641  BP: 127/86  Pulse: 84  Resp: 20  Temp: 36.7 C    Last Pain:  Vitals:   11/26/16 0641  TempSrc: Oral         Complications: No apparent anesthesia complications

## 2016-11-26 NOTE — Interval H&P Note (Signed)
History and Physical Interval Note:  11/26/2016 7:28 AM  Vincent Strickland  has presented today for surgery, with the diagnosis of adenocarcinoma of the upper rectum  The various methods of treatment have been discussed with the patient and family. After consideration of risks, benefits and other options for treatment, the patient has consented to  Procedure(s): REMOVAL PORT-A-CATH (N/A) as a surgical intervention .  The patient's history has been reviewed, patient examined, no change in status, stable for surgery.  I have reviewed the patient's chart and labs.  Questions were answered to the patient's satisfaction.     [name]

## 2016-11-26 NOTE — Anesthesia Postprocedure Evaluation (Signed)
Anesthesia Post Note  Patient: Vincent Strickland  Procedure(s) Performed: REMOVAL PORT-A-CATH (N/A Chest)     Patient location during evaluation: PACU Anesthesia Type: MAC Level of consciousness: awake and alert Pain management: pain level controlled Vital Signs Assessment: post-procedure vital signs reviewed and stable Respiratory status: spontaneous breathing, nonlabored ventilation, respiratory function stable and patient connected to nasal cannula oxygen Cardiovascular status: stable and blood pressure returned to baseline Postop Assessment: no apparent nausea or vomiting Anesthetic complications: no    Last Vitals:  Vitals:   11/26/16 0838 11/26/16 0846  BP:  94/71  Pulse: 93 97  Resp: (!) 8 12  Temp:  36.6 C  SpO2: 95% 97%    Last Pain:  Vitals:   11/26/16 0846  TempSrc: Oral  PainSc:                  [place] S

## 2016-11-26 NOTE — H&P (Signed)
Chief Complaint:  Left subclavian portacath with nonuse  History of Present Illness:  Vincent Strickland is an 51 y.o. male had port placed Dec 2017.  Received chemotherapy and no longer needs port.    Past Medical History:  Diagnosis Date  . 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 Headache    occas migraines  . Hemorrhoid   . High cholesterol   . History of bladder infections   . History of colon cancer, stage III 01/07/2016  . History of kidney stones   . History of radiation therapy 05/18/16-06/25/16   rectum 45 Gy in 25 fraction, rectom boost 5.4 Gy in 3 fractions  . Hypertension   . Hypogonadism male   . Renal disorder    kidney stones  . Vertigo, benign positional     Past Surgical History:  Procedure Laterality Date  . COLONOSCOPY WITH PROPOFOL Left 12/31/2015   Procedure: COLONOSCOPY WITH PROPOFOL;  Surgeon: Arta Silence, MD;  Location: [facility];  Service: Endoscopy;  Laterality: Left;  . LAPAROSCOPIC SIGMOID COLECTOMY N/A 01/01/2016   Procedure: LAPAROSCOPIC ASSISTED SIGMOID COLECTOMY;  Surgeon: Johnathan Hausen, MD;  Location: [facility] ORS;  Service: General;  Laterality: N/A;  . LITHOTRIPSY    . PORTACATH PLACEMENT N/A 01/27/2016   Procedure: INSERTION PORT-A-CATH;  Surgeon: Johnathan Hausen, MD;  Location: [facility] ORS;  Service: General;  Laterality: N/A;  . TONSILLECTOMY      Current Facility-Administered Medications  Medication Dose Route Frequency Provider Last Rate Last Dose  . ceFAZolin (ANCEF) IVPB 2g/100 mL premix  2 g Intravenous On Call to OR Johnathan Hausen, MD      . Chlorhexidine Gluconate Cloth 2 % PADS 6 each  6 each Topical Once Johnathan Hausen, MD       And  . Chlorhexidine Gluconate Cloth 2 % PADS 6 each  6 each Topical Once Johnathan Hausen, MD      . fentaNYL (SUBLIMAZE) injection 50-100 mcg  50-100 mcg Intravenous PRN Nolon Nations, MD      . lactated ringers infusion   Intravenous Continuous Nolon Nations, MD       . midazolam (VERSED) injection 1-2 mg  1-2 mg Intravenous PRN Nolon Nations, MD      . scopolamine (TRANSDERM-SCOP) 1 MG/3DAYS 1.5 mg  1 patch Transdermal Once PRN Nolon Nations, MD       Niaspan [niacin]; Other; and Percocet [oxycodone-acetaminophen] Family History  Problem Relation Age of Onset  . Hypertension Mother   . Cancer Mother        ovarian tumor  . Cancer Father        prostate  . Migraines Father   . Hypertension Maternal Grandmother   . Cancer Maternal Grandmother        esophageal  . Diabetes Paternal Grandfather   . COPD Paternal Grandfather   . Cancer Maternal Grandfather        suspect colon   Social History:   reports that he has never smoked. He has never used smokeless tobacco. He reports that he does not drink alcohol or use drugs.   REVIEW OF SYSTEMS : Negative except for see problem list  Physical Exam:   Blood pressure 127/86, pulse 84, temperature 98.1 F (36.7 C), temperature source Oral, resp. rate 20, height 6\' 1"  (1.854 m), weight 114.8 kg (253 lb). Body mass index is 33.38 kg/m.  Gen:  WDWN WM NAD  Neurological: Alert and oriented to person, place, and time. Motor and sensory  function is grossly intact  Head: Normocephalic and atraumatic.  Eyes: Conjunctivae are normal. Pupils are equal, round, and reactive to light. No scleral icterus.  Neck: Normal range of motion. Neck supple. No tracheal deviation or thyromegaly present.  Cardiovascular:  SR without murmurs or gallops.  No carotid bruits Breast:  Not examined Respiratory: Effort normal.  No respiratory distress. No chest wall tenderness. Breath sounds normal.  No wheezes, rales or rhonchi.  Abdomen:  Incision healed GU:  Not examined Musculoskeletal: Normal range of motion. Extremities are nontender. No cyanosis, edema or clubbing noted Lymphadenopathy: No cervical, preauricular, postauricular or axillary adenopathy is present Skin: Skin is warm and dry. No rash noted. No  diaphoresis. No erythema. No pallor. Pscyh: Normal mood and affect. Behavior is normal. Judgment and thought content normal.   LABORATORY RESULTS: No results found for this or any previous visit (from the past 48 hour(s)).   RADIOLOGY RESULTS: No results found.  Problem List: Patient Active Problem List   Diagnosis Date Noted  . History of colon cancer, stage III 01/07/2016  . 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 GI bleed 12/31/2015  . Bright red blood per rectum 12/30/2015  . Hemorrhoids 12/30/2015  . Renal disorder   . Essential hypertension   . Pure hypercholesterolemia   . Hypogonadism male     Assessment & Plan: portacath nonuse--to be explanted.      Matt B. Hassell Done, MD, [facility] [phone] beeper [phone]  11/26/2016 7:25 AM

## 2016-11-26 NOTE — Anesthesia Preprocedure Evaluation (Signed)
Anesthesia Evaluation  Patient identified by MRN, date of birth, ID band Patient awake    Reviewed: Allergy & Precautions, H&P , NPO status , Patient's Chart, lab work & pertinent test results  Airway Mallampati: II   Neck ROM: full    Dental   Pulmonary neg pulmonary ROS,    breath sounds clear to auscultation       Cardiovascular hypertension,  Rhythm:regular Rate:Normal     Neuro/Psych  Headaches,    GI/Hepatic Colon CA s/p colectomy   Endo/Other  Obese.  hypogonadism  Renal/GU      Musculoskeletal   Abdominal   Peds  Hematology   Anesthesia Other Findings   Reproductive/Obstetrics                             Anesthesia Physical Anesthesia Plan  ASA: II  Anesthesia Plan: MAC   Post-op Pain Management:    Induction: Intravenous  PONV Risk Score and Plan: 1 and Ondansetron, Propofol infusion and Midazolam  Airway Management Planned: Simple Face Mask  Additional Equipment:   Intra-op Plan:   Post-operative Plan:   Informed Consent: I have reviewed the patients History and Physical, chart, labs and discussed the procedure including the risks, benefits and alternatives for the proposed anesthesia with the patient or authorized representative who has indicated his/her understanding and acceptance.     Plan Discussed with: CRNA, Anesthesiologist and Surgeon  Anesthesia Plan Comments:         Anesthesia Quick Evaluation

## 2016-11-26 NOTE — Discharge Instructions (Signed)

## 2016-11-29 ENCOUNTER — Encounter (HOSPITAL_BASED_OUTPATIENT_CLINIC_OR_DEPARTMENT_OTHER): Payer: Self-pay | Admitting: Surgery

## 2016-12-29 ENCOUNTER — Encounter: Payer: Self-pay | Admitting: Hematology & Oncology

## 2016-12-29 ENCOUNTER — Other Ambulatory Visit: Payer: Managed Care, Other (non HMO)

## 2016-12-29 ENCOUNTER — Other Ambulatory Visit (HOSPITAL_BASED_OUTPATIENT_CLINIC_OR_DEPARTMENT_OTHER): Payer: Managed Care, Other (non HMO)

## 2016-12-29 ENCOUNTER — Ambulatory Visit (HOSPITAL_BASED_OUTPATIENT_CLINIC_OR_DEPARTMENT_OTHER): Payer: Managed Care, Other (non HMO) | Admitting: Hematology & Oncology

## 2016-12-29 VITALS — BP 138/89 | HR 67 | Temp 98.2°F

## 2016-12-29 DIAGNOSIS — C187 Malignant neoplasm of sigmoid colon: Secondary | ICD-10-CM

## 2016-12-29 DIAGNOSIS — C772 Secondary and unspecified malignant neoplasm of intra-abdominal lymph nodes: Principal | ICD-10-CM

## 2016-12-29 DIAGNOSIS — Z85038 Personal history of other malignant neoplasm of large intestine: Secondary | ICD-10-CM

## 2016-12-29 LAB — CMP (CANCER CENTER ONLY)
ALBUMIN: 4.1 g/dL (ref 3.3–5.5)
ALT(SGPT): 46 U/L (ref 10–47)
AST: 26 U/L (ref 11–38)
Alkaline Phosphatase: 71 U/L (ref 26–84)
BUN: 11 mg/dL (ref 7–22)
CO2: 29 meq/L (ref 18–33)
CREATININE: 0.9 mg/dL (ref 0.6–1.2)
Calcium: 9.9 mg/dL (ref 8.0–10.3)
Chloride: 105 mEq/L (ref 98–108)
Glucose, Bld: 110 mg/dL (ref 73–118)
Potassium: 3.6 mEq/L (ref 3.3–4.7)
SODIUM: 146 meq/L — AB (ref 128–145)
Total Bilirubin: 1 mg/dl (ref 0.20–1.60)
Total Protein: 7.2 g/dL (ref 6.4–8.1)

## 2016-12-29 LAB — CBC WITH DIFFERENTIAL (CANCER CENTER ONLY)
BASO#: 0 10*3/uL (ref 0.0–0.2)
BASO%: 1 % (ref 0.0–2.0)
EOS ABS: 0.1 10*3/uL (ref 0.0–0.5)
EOS%: 2 % (ref 0.0–7.0)
HCT: 43 % (ref 38.7–49.9)
HGB: 15.7 g/dL (ref 13.0–17.1)
LYMPH#: 0.7 10*3/uL — ABNORMAL LOW (ref 0.9–3.3)
LYMPH%: 16.8 % (ref 14.0–48.0)
MCH: 31.2 pg (ref 28.0–33.4)
MCHC: 36 g/dL — AB (ref 32.0–35.9)
MCV: 84 fL (ref 82–98)
MONO#: 0.4 10*3/uL (ref 0.1–0.9)
MONO%: 10.5 % (ref 0.0–13.0)
NEUT#: 2.8 10*3/uL (ref 1.5–6.5)
NEUT%: 69.7 % (ref 40.0–80.0)
PLATELETS: 217 10*3/uL (ref 145–400)
RBC: 5.07 10*6/uL (ref 4.20–5.70)
RDW: 13.8 % (ref 11.1–15.7)
WBC: 4 10*3/uL (ref 4.0–10.0)

## 2016-12-29 LAB — CEA (IN HOUSE-CHCC): CEA (CHCC-IN HOUSE): 1.37 ng/mL (ref 0.00–5.00)

## 2016-12-29 LAB — LACTATE DEHYDROGENASE: LDH: 188 U/L (ref 125–245)
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
## 2017-01-05 ENCOUNTER — Ambulatory Visit: Payer: Managed Care, Other (non HMO) | Admitting: Hematology & Oncology

## 2017-01-24 ENCOUNTER — Other Ambulatory Visit: Payer: Self-pay | Admitting: Hematology & Oncology

## 2017-01-24 DIAGNOSIS — Z85038 Personal history of other malignant neoplasm of large intestine: Secondary | ICD-10-CM

## 2017-01-24 DIAGNOSIS — E876 Hypokalemia: Secondary | ICD-10-CM

## 2017-03-20 ENCOUNTER — Other Ambulatory Visit: Payer: Self-pay | Admitting: Hematology & Oncology

## 2017-03-20 DIAGNOSIS — E876 Hypokalemia: Secondary | ICD-10-CM

## 2017-03-20 DIAGNOSIS — Z85038 Personal history of other malignant neoplasm of large intestine: Secondary | ICD-10-CM

## 2017-05-10 ENCOUNTER — Telehealth: Payer: Self-pay | Admitting: *Deleted

## 2017-05-10 NOTE — Telephone Encounter (Signed)
Dr Marin Olp received results from CT scans. They are negative for any signs of colon cancer. Dr Marin Olp would like me to call patient and make him aware.   Patient is aware of results.

## 2017-05-11 ENCOUNTER — Other Ambulatory Visit: Payer: Self-pay

## 2017-05-11 ENCOUNTER — Telehealth: Payer: Self-pay | Admitting: *Deleted

## 2017-05-11 ENCOUNTER — Inpatient Hospital Stay: Payer: Managed Care, Other (non HMO)

## 2017-05-11 ENCOUNTER — Encounter: Payer: Self-pay | Admitting: Hematology & Oncology

## 2017-05-11 ENCOUNTER — Inpatient Hospital Stay: Payer: Managed Care, Other (non HMO) | Attending: Hematology & Oncology | Admitting: Hematology & Oncology

## 2017-05-11 VITALS — BP 132/89 | HR 89 | Temp 98.2°F | Resp 20 | Wt 256.0 lb

## 2017-05-11 DIAGNOSIS — G629 Polyneuropathy, unspecified: Secondary | ICD-10-CM

## 2017-05-11 DIAGNOSIS — Z923 Personal history of irradiation: Secondary | ICD-10-CM | POA: Diagnosis not present

## 2017-05-11 DIAGNOSIS — Z85038 Personal history of other malignant neoplasm of large intestine: Secondary | ICD-10-CM | POA: Diagnosis present

## 2017-05-11 DIAGNOSIS — Z9221 Personal history of antineoplastic chemotherapy: Secondary | ICD-10-CM | POA: Insufficient documentation

## 2017-05-11 DIAGNOSIS — R350 Frequency of micturition: Secondary | ICD-10-CM | POA: Diagnosis not present

## 2017-05-11 DIAGNOSIS — Z79899 Other long term (current) drug therapy: Secondary | ICD-10-CM | POA: Diagnosis not present

## 2017-05-11 LAB — CBC WITH DIFFERENTIAL (CANCER CENTER ONLY)
BASOS ABS: 0 10*3/uL (ref 0.0–0.1)
BASOS PCT: 1 %
EOS PCT: 1 %
Eosinophils Absolute: 0.1 10*3/uL (ref 0.0–0.5)
HEMATOCRIT: 42.1 % (ref 38.7–49.9)
Hemoglobin: 15.3 g/dL (ref 13.0–17.1)
Lymphocytes Relative: 16 %
Lymphs Abs: 1 10*3/uL (ref 0.9–3.3)
MCH: 32.8 pg (ref 28.0–33.4)
MCHC: 36.2 g/dL — ABNORMAL HIGH (ref 32.0–35.9)
MCV: 89.2 fL (ref 82.0–98.0)
MONO ABS: 0.5 10*3/uL (ref 0.1–0.9)
Monocytes Relative: 9 %
NEUTROS ABS: 4.4 10*3/uL (ref 1.5–6.5)
Neutrophils Relative %: 73 %
PLATELETS: 231 10*3/uL (ref 145–400)
RBC: 4.72 MIL/uL (ref 4.20–5.70)
RDW: 13.3 % (ref 11.1–15.7)
WBC: 6.1 10*3/uL (ref 4.0–10.0)

## 2017-05-11 LAB — CMP (CANCER CENTER ONLY)
ALT: 42 U/L (ref 10–47)
ANION GAP: 8 (ref 5–15)
AST: 28 U/L (ref 11–38)
Albumin: 4 g/dL (ref 3.5–5.0)
Alkaline Phosphatase: 76 U/L (ref 26–84)
BUN: 12 mg/dL (ref 7–22)
CALCIUM: 9.7 mg/dL (ref 8.0–10.3)
CO2: 31 mmol/L (ref 18–33)
Chloride: 104 mmol/L (ref 98–108)
Creatinine: 1 mg/dL (ref 0.60–1.20)
GLUCOSE: 103 mg/dL (ref 73–118)
POTASSIUM: 3.1 mmol/L — AB (ref 3.3–4.7)
Sodium: 143 mmol/L (ref 128–145)
Total Bilirubin: 1 mg/dL (ref 0.2–1.6)
Total Protein: 7.2 g/dL (ref 6.4–8.1)

## 2017-05-11 LAB — CEA (IN HOUSE-CHCC): CEA (CHCC-In House): 1.31 ng/mL (ref 0.00–5.00)

## 2017-05-11 LAB — LACTATE DEHYDROGENASE: LDH: 197 U/L (ref 125–245)

## 2017-05-11 NOTE — Progress Notes (Signed)
Hematology and Oncology Follow Up Visit  Vincent Strickland [id number] 09-02-1965 52 y.o. 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   Everything is looking quite good.  He is doing well.  He still has the neuropathy but this might be getting a little bit better.  He is able to type.  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  He is exercising.  He is on gabapentin.  He did have a CT scan done a week ago.  This is done at [facility]  The CT scan did not show any evidence of recurrent disease.  He has had no fever.  Is had no cough or shortness of breath.  He has had no bleeding.  There is been no change in bowel or bladder habits.  He does have some urinary frequency.  I am sure this probably is from the radiation that he had.  He has had no leg swelling.  He has had no rashes.  Overall, his performance status is ECOG 1.  Medications:  Allergies as of 05/11/2017      Reactions   Niaspan [niacin]    Severe flushing   Other Other (See Comments)   After port placement pt had severe skin reaction in armpits and neck area "could see brush red brush marks, itched, burned and skin peeled off , severe burn"   Percocet [oxycodone-acetaminophen] Nausea Only   hallucinations      Medication List        Accurate as of 05/11/17 12:32 PM. Always use your most recent med list.          acetaminophen 500 MG tablet Commonly known as:  TYLENOL Take 1,000 mg by mouth every 6 (six) hours as needed (for pain.).   BENFOTIAMINE MULTI-B PO Take by mouth.   gabapentin 300 MG capsule Commonly known as:  NEURONTIN TAKE 2 CAPSULES BY MOUTH AT BEDTIME   KLOR-CON M20 20 MEQ tablet Generic drug:  potassium chloride  SA TAKE 2 TABLETS BY MOUTH TWICE A DAY   losartan-hydrochlorothiazide 100-25 MG tablet Commonly known as:  HYZAAR TAKE 1 TABLET BY MOUTH EVERY DAY   omeprazole 20 MG capsule Commonly known as:  PRILOSEC Take 20 mg by mouth daily.   polyethylene glycol packet Commonly known as:  MIRALAX / GLYCOLAX Take 17 g by mouth 2 (two) times daily.   PROBIOTIC PO Take by mouth.   vitamin B-6 250 MG tablet Take 2 tablets (500 mg total) by mouth daily.       Allergies:  Allergies  Allergen Reactions  . Niaspan [Niacin]     Severe flushing  . Other Other (See Comments)    After port placement pt had severe skin reaction in armpits and neck area "could see brush red brush marks, itched, burned and skin peeled off , severe burn"  . Percocet [Oxycodone-Acetaminophen] Nausea Only    hallucinations    Past Medical History, Surgical history, Social history, and Family History were reviewed and updated.  Review of Systems: Review of Systems  Constitutional: Negative.   HENT: Negative.   Eyes: Negative.   Respiratory: Negative.   Cardiovascular: Negative.   Gastrointestinal: Negative.   Genitourinary: Negative.   Musculoskeletal: Negative.  Skin: Negative.   Neurological: Positive for tingling.  Endo/Heme/Allergies: Negative.   Psychiatric/Behavioral: Negative.      Physical Exam:  weight is 256 lb (116.1 kg). His oral temperature is 98.2 F (36.8 C). His blood pressure is 132/89 and his pulse is 89. His respiration is 20 and oxygen saturation is 99%.   Wt Readings from Last 3 Encounters:  05/11/17 256 lb (116.1 kg)  11/26/16 253 lb (114.8 kg)  10/06/16 260 lb (117.9 kg)    Physical Exam  Constitutional: He is oriented to person, place, and time.  HENT:  Head: Normocephalic and atraumatic.  Mouth/Throat: Oropharynx is clear and moist.  Eyes: Pupils are equal, round, and reactive to light. EOM are normal.  Neck: Normal range of motion.  Cardiovascular: Normal rate,  regular rhythm and normal heart sounds.  Pulmonary/Chest: Effort normal and breath sounds normal.  Abdominal: Soft. Bowel sounds are normal.  Musculoskeletal: Normal range of motion. He exhibits no edema, tenderness or deformity.  Lymphadenopathy:    He has no cervical adenopathy.  Neurological: He is alert and oriented to person, place, and time.  Skin: Skin is warm and dry. No rash noted. No erythema.  Psychiatric: He has a normal mood and affect. His behavior is normal. Judgment and thought content normal.  Vitals reviewed.    Lab Results  Component Value Date   WBC 6.1 05/11/2017   HGB 15.7 12/29/2016   HCT 42.1 05/11/2017   MCV 89.2 05/11/2017   PLT 231 05/11/2017   Lab Results  Component Value Date   FERRITIN 34 01/13/2016   IRON 41 (L) 01/13/2016   TIBC 369 01/13/2016   UIBC 328 01/13/2016   IRONPCTSAT 11 (L) 01/13/2016   Lab Results  Component Value Date   RBC 4.72 05/11/2017   No results found for: KPAFRELGTCHN, LAMBDASER, KAPLAMBRATIO No results found for: IGGSERUM, IGA, IGMSERUM No results found for: Odetta Pink, SPEI   Chemistry      Component Value Date/Time   NA 143 05/11/2017 1131   NA 146 (H) 12/29/2016 0838   K 3.1 (L) 05/11/2017 1131   K 3.6 12/29/2016 0838   CL 104 05/11/2017 1131   CL 105 12/29/2016 0838   CO2 31 05/11/2017 1131   CO2 29 12/29/2016 0838   BUN 12 05/11/2017 1131   BUN 11 12/29/2016 0838   CREATININE 1.00 05/11/2017 1131   CREATININE 0.9 12/29/2016 0838      Component Value Date/Time   CALCIUM 9.7 05/11/2017 1131   CALCIUM 9.9 12/29/2016 0838   ALKPHOS 76 05/11/2017 1131   ALKPHOS 71 12/29/2016 0838   AST 28 05/11/2017 1131   ALT 42 05/11/2017 1131   ALT 46 12/29/2016 0838   BILITOT 1.0 05/11/2017 1131     Impression and Plan: Vincent Strickland is a pleasant 52 yo gentleman with 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   I will plan to  get him back in 6 months now.  I think this is very reasonable.    We will get the CT scan done before I see him back.   [name], MD 4/17/201912:32 PM

## 2017-05-11 NOTE — Telephone Encounter (Addendum)
Patient is aware of result  ----- Message from [name], MD sent at 05/11/2017  3:07 PM EDT ----- Call - the CEA level is normal at 1.31  Vincent Strickland

## 2017-05-20 ENCOUNTER — Other Ambulatory Visit: Payer: Self-pay | Admitting: Hematology & Oncology

## 2017-05-20 DIAGNOSIS — E876 Hypokalemia: Secondary | ICD-10-CM

## 2017-05-20 DIAGNOSIS — Z85038 Personal history of other malignant neoplasm of large intestine: Secondary | ICD-10-CM

## 2017-07-03 ENCOUNTER — Emergency Department (HOSPITAL_COMMUNITY)
Admission: EM | Admit: 2017-07-03 | Discharge: 2017-07-03 | Disposition: A | Discharge status: Home / Self Care | Payer: Managed Care, Other (non HMO) | Attending: Emergency Medicine | Admitting: Emergency Medicine

## 2017-07-03 ENCOUNTER — Encounter (HOSPITAL_COMMUNITY): Payer: Self-pay | Admitting: *Deleted

## 2017-07-03 ENCOUNTER — Other Ambulatory Visit: Payer: Self-pay

## 2017-07-03 DIAGNOSIS — L538 Other specified erythematous conditions: Secondary | ICD-10-CM | POA: Insufficient documentation

## 2017-07-03 DIAGNOSIS — Z85038 Personal history of other malignant neoplasm of large intestine: Secondary | ICD-10-CM | POA: Insufficient documentation

## 2017-07-03 DIAGNOSIS — I10 Essential (primary) hypertension: Secondary | ICD-10-CM | POA: Insufficient documentation

## 2017-07-03 DIAGNOSIS — Z79899 Other long term (current) drug therapy: Secondary | ICD-10-CM | POA: Diagnosis not present

## 2017-07-03 DIAGNOSIS — W57XXXA Bitten or stung by nonvenomous insect and other nonvenomous arthropods, initial encounter: Secondary | ICD-10-CM

## 2017-07-03 DIAGNOSIS — M7989 Other specified soft tissue disorders: Secondary | ICD-10-CM | POA: Diagnosis present

## 2017-07-03 MED ORDER — DOXYCYCLINE HYCLATE 100 MG PO CAPS: 100.0000 mg | ORAL_CAPSULE | Freq: Two times a day (BID) | ORAL | 0 refills | Status: DC

## 2017-07-03 NOTE — ED Notes (Signed)
Pulled tick off of leg several days ago, now here for spot on leg with itching  Area is scabbed and has no redness swelling not target

## 2017-07-03 NOTE — ED Triage Notes (Signed)
Pt c/o tick bite to right lower leg. Denies fever.

## 2017-07-04 NOTE — ED Provider Notes (Signed)
[facility] EMERGENCY DEPARTMENT Provider Note   CSN: [id number] Arrival date & time: 07/03/17  1137     History   Chief Complaint Chief Complaint  Patient presents with  . Animal Bite    tick bite    HPI Vincent Strickland is a 52 y.o. male with a history of metastatic colon cancer, currently in remission who has finished his treatment including radiation and chemotherapy (finished 9/18) presenting with a tick bite which was firmly attached on his right lower leg.  He reports increasing redness around the site of the bite, removed the tick several days ago and states he has never reacted to a tick like this in the past and is concerned for possible early infection as the insect was definitely a deer tick.  He denies fevers, chills, headache, n/v, abdominal pain and has no rash. The site around the bite is pruritic. There has been no drainage from the wound. He has had no treatment prior to arrival.  The history is provided by the patient.    Past Medical History:  Diagnosis Date  . 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 Headache    occas migraines  . Hemorrhoid   . High cholesterol   . History of bladder infections   . History of colon cancer, stage III 01/07/2016  . History of kidney stones   . History of radiation therapy 05/18/16-06/25/16   rectum 45 Gy in 25 fraction, rectom boost 5.4 Gy in 3 fractions  . Hypertension   . Hypogonadism male   . Renal disorder    kidney stones  . Vertigo, benign positional     Patient Active Problem List   Diagnosis Date Noted  . History of colon cancer, stage III 01/07/2016  . 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 GI bleed 12/31/2015  . Bright red blood per rectum 12/30/2015  . Hemorrhoids 12/30/2015  . Renal disorder   . Essential hypertension   . Pure hypercholesterolemia   . Hypogonadism male     Past Surgical History:  Procedure  Laterality Date  . COLONOSCOPY WITH PROPOFOL Left 12/31/2015   Procedure: COLONOSCOPY WITH PROPOFOL;  Surgeon: Arta Silence, MD;  Location: [facility];  Service: Endoscopy;  Laterality: Left;  . LAPAROSCOPIC SIGMOID COLECTOMY N/A 01/01/2016   Procedure: LAPAROSCOPIC ASSISTED SIGMOID COLECTOMY;  Surgeon: Johnathan Hausen, MD;  Location: [facility];  Service: General;  Laterality: N/A;  . LITHOTRIPSY    . PORT-A-CATH REMOVAL N/A 11/26/2016   Procedure: REMOVAL PORT-A-CATH;  Surgeon: Johnathan Hausen, MD;  Location: [place];  Service: General;  Laterality: N/A;  . PORTACATH PLACEMENT N/A 01/27/2016   Procedure: INSERTION PORT-A-CATH;  Surgeon: Johnathan Hausen, MD;  Location: [facility];  Service: General;  Laterality: N/A;  . TONSILLECTOMY          Home Medications    Prior to Admission medications   Medication Sig Start Date End Date Taking? Authorizing Provider  acetaminophen (TYLENOL) 500 MG tablet Take 1,000 mg by mouth every 6 (six) hours as needed (for pain.).    [provider]  B Complex-Folic Acid (BENFOTIAMINE MULTI-B PO) Take by mouth.    [provider]  doxycycline (VIBRAMYCIN) 100 MG capsule Take 1 capsule (100 mg total) by mouth 2 (two) times daily. 07/03/17   Evalee Jefferson, PA-C  gabapentin (NEURONTIN) 300 MG capsule TAKE 2 CAPSULES BY MOUTH AT BEDTIME 05/20/17   [name], MD  KLOR-CON M20 20 MEQ tablet TAKE 2 TABLETS BY MOUTH TWICE A DAY 03/21/17   [name], MD  losartan-hydrochlorothiazide (HYZAAR) 100-25 MG tablet TAKE 1 TABLET BY MOUTH EVERY DAY 08/12/16   Susy Frizzle, MD  omeprazole (PRILOSEC) 20 MG capsule Take 20 mg by mouth daily.    [provider]  polyethylene glycol (MIRALAX / GLYCOLAX) packet Take 17 g by mouth 2 (two) times daily.    [provider]  Probiotic Product (PROBIOTIC PO) Take by mouth.    [provider]  Pyridoxine HCl (VITAMIN B-6) 250 MG tablet Take 2 tablets (500 mg total) by mouth  daily. 07/27/16   [name], MD  prochlorperazine (COMPAZINE) 10 MG tablet Take 1 tablet (10 mg total) by mouth every 6 (six) hours as needed (Nausea or vomiting). Patient not taking: Reported on 04/21/2016 01/28/16 06/25/16  [name], MD    Family History Family History  Problem Relation Age of Onset  . Hypertension Mother   . Cancer Mother        ovarian tumor  . Cancer Father        prostate  . Migraines Father   . Hypertension Maternal Grandmother   . Cancer Maternal Grandmother        esophageal  . Diabetes Paternal Grandfather   . COPD Paternal Grandfather   . Cancer Maternal Grandfather        suspect colon    Social History Social History   Tobacco Use  . Smoking status: Never Smoker  . Smokeless tobacco: Never Used  Substance Use Topics  . Alcohol use: No  . Drug use: No     Allergies   Niaspan [niacin]; Other; and Percocet [oxycodone-acetaminophen]   Review of Systems Review of Systems  Constitutional: Negative for chills and fever.  HENT: Negative.   Respiratory: Negative for shortness of breath and wheezing.   Cardiovascular: Negative.   Gastrointestinal: Negative.  Negative for abdominal pain, nausea and vomiting.  Musculoskeletal: Negative for myalgias and neck pain.  Skin: Positive for color change and wound. Negative for rash.  Neurological: Negative for weakness and headaches.     Physical Exam Updated Vital Signs BP (!) 138/95 (BP Location: Right Arm)   Pulse 76   Temp 98.4 F (36.9 C) (Oral)   Resp 16   Ht 6\' 1"  (1.854 m)   Wt 115.7 kg (255 lb)   SpO2 97%   BMI 33.64 kg/m   Physical Exam  Constitutional: He appears well-developed and well-nourished. No distress.  HENT:  Head: Normocephalic.  Neck: Neck supple.  Cardiovascular: Normal rate.  Pulmonary/Chest: Effort normal. He has no wheezes.  Musculoskeletal: Normal range of motion. He exhibits no edema.  Skin: Skin is warm. No rash noted. There is erythema.    Localized 1 cm slightly raised induration right medial lower leg.  There is faint flushing around the bite site. No retained fb.  No red streaking.  No fluctuance or drainage. Palms without rash.     ED Treatments / Results  Labs (all labs ordered are listed, but only abnormal results are displayed) Labs Reviewed - No data to display  EKG None  Radiology No results found.  Procedures Procedures (including critical care time)  Medications Ordered in ED Medications - No data to display   Initial Impression / Assessment and Plan / ED Course  I have reviewed the triage vital signs and the nursing notes.  Pertinent labs & imaging results that were  available during my care of the patient were reviewed by me and considered in my medical decision making (see chart for details).     Pt with embedded tick exposure with mild flushing around the bite site, possibly localized inflammatory reaction, or early cellulitis. No bullseye pattern at this time.  Pt will be covered with doxycycline. Advised recheck by pcp or return here for any new or worsened sx. Tick bite info given.  Final Clinical Impressions(s) / ED Diagnoses   Final diagnoses:  Tick bite, initial encounter    ED Discharge Orders        Ordered    doxycycline (VIBRAMYCIN) 100 MG capsule  2 times daily     07/03/17 1520       Evalee Jefferson, Hershal Coria 07/04/17 1024    Nat Christen, MD 07/04/17 1046

## 2017-07-19 ENCOUNTER — Other Ambulatory Visit: Payer: Self-pay | Admitting: Family Medicine

## 2017-08-20 ENCOUNTER — Other Ambulatory Visit: Payer: Self-pay | Admitting: Hematology & Oncology

## 2017-08-20 DIAGNOSIS — E876 Hypokalemia: Secondary | ICD-10-CM

## 2017-08-20 DIAGNOSIS — Z85038 Personal history of other malignant neoplasm of large intestine: Secondary | ICD-10-CM

## 2017-10-12 ENCOUNTER — Other Ambulatory Visit: Payer: Self-pay | Admitting: Hematology & Oncology

## 2017-10-12 DIAGNOSIS — Z85038 Personal history of other malignant neoplasm of large intestine: Secondary | ICD-10-CM

## 2017-10-12 DIAGNOSIS — E876 Hypokalemia: Secondary | ICD-10-CM

## 2017-11-03 ENCOUNTER — Inpatient Hospital Stay: Payer: Managed Care, Other (non HMO) | Attending: Hematology & Oncology

## 2017-11-03 DIAGNOSIS — G629 Polyneuropathy, unspecified: Secondary | ICD-10-CM | POA: Diagnosis not present

## 2017-11-03 DIAGNOSIS — Z85038 Personal history of other malignant neoplasm of large intestine: Secondary | ICD-10-CM

## 2017-11-03 DIAGNOSIS — Z923 Personal history of irradiation: Secondary | ICD-10-CM | POA: Diagnosis not present

## 2017-11-03 DIAGNOSIS — Z79899 Other long term (current) drug therapy: Secondary | ICD-10-CM | POA: Diagnosis not present

## 2017-11-03 DIAGNOSIS — Z9221 Personal history of antineoplastic chemotherapy: Secondary | ICD-10-CM | POA: Diagnosis not present

## 2017-11-03 DIAGNOSIS — Z85048 Personal history of other malignant neoplasm of rectum, rectosigmoid junction, and anus: Secondary | ICD-10-CM | POA: Insufficient documentation

## 2017-11-03 LAB — CMP (CANCER CENTER ONLY)
ALK PHOS: 65 U/L (ref 26–84)
ALT: 50 U/L — AB (ref 10–47)
AST: 30 U/L (ref 11–38)
Albumin: 4.1 g/dL (ref 3.5–5.0)
Anion gap: 1 — ABNORMAL LOW (ref 5–15)
BILIRUBIN TOTAL: 0.9 mg/dL (ref 0.2–1.6)
BUN: 9 mg/dL (ref 7–22)
CHLORIDE: 107 mmol/L (ref 98–108)
CO2: 33 mmol/L (ref 18–33)
CREATININE: 0.9 mg/dL (ref 0.60–1.20)
Calcium: 9.7 mg/dL (ref 8.0–10.3)
Glucose, Bld: 116 mg/dL (ref 73–118)
POTASSIUM: 3.8 mmol/L (ref 3.3–4.7)
Sodium: 141 mmol/L (ref 128–145)
TOTAL PROTEIN: 7.1 g/dL (ref 6.4–8.1)

## 2017-11-03 LAB — CBC WITH DIFFERENTIAL (CANCER CENTER ONLY)
ABS IMMATURE GRANULOCYTES: 0.02 10*3/uL (ref 0.00–0.07)
BASOS PCT: 1 %
Basophils Absolute: 0.1 10*3/uL (ref 0.0–0.1)
Eosinophils Absolute: 0.1 10*3/uL (ref 0.0–0.5)
Eosinophils Relative: 2 %
HCT: 43.2 % (ref 39.0–52.0)
Hemoglobin: 15.2 g/dL (ref 13.0–17.0)
IMMATURE GRANULOCYTES: 0 %
LYMPHS ABS: 1 10*3/uL (ref 0.7–4.0)
LYMPHS PCT: 21 %
MCH: 31.6 pg (ref 26.0–34.0)
MCHC: 35.2 g/dL (ref 30.0–36.0)
MCV: 89.8 fL (ref 80.0–100.0)
MONOS PCT: 7 %
Monocytes Absolute: 0.4 10*3/uL (ref 0.1–1.0)
NEUTROS ABS: 3.3 10*3/uL (ref 1.7–7.7)
NEUTROS PCT: 69 %
PLATELETS: 229 10*3/uL (ref 150–400)
RBC: 4.81 MIL/uL (ref 4.22–5.81)
RDW: 12.9 % (ref 11.5–15.5)
WBC Count: 4.9 10*3/uL (ref 4.0–10.5)
nRBC: 0 % (ref 0.0–0.2)

## 2017-11-03 LAB — CEA (IN HOUSE-CHCC): CEA (CHCC-In House): 1.04 ng/mL (ref 0.00–5.00)

## 2017-11-04 ENCOUNTER — Telehealth: Payer: Self-pay | Admitting: *Deleted

## 2017-11-04 NOTE — Telephone Encounter (Addendum)
Left message without return call. Patient has an appointment with MD tomorrow. Will give results then.   ----- Message from [name], MD sent at 11/03/2017  2:39 PM EDT ----- Call - NORMAL CEA of 1.04!!!!  Vincent Strickland

## 2017-11-08 ENCOUNTER — Other Ambulatory Visit: Payer: Self-pay

## 2017-11-08 ENCOUNTER — Inpatient Hospital Stay (HOSPITAL_BASED_OUTPATIENT_CLINIC_OR_DEPARTMENT_OTHER): Payer: Managed Care, Other (non HMO) | Admitting: Hematology & Oncology

## 2017-11-08 DIAGNOSIS — Z9221 Personal history of antineoplastic chemotherapy: Secondary | ICD-10-CM

## 2017-11-08 DIAGNOSIS — Z923 Personal history of irradiation: Secondary | ICD-10-CM | POA: Diagnosis not present

## 2017-11-08 DIAGNOSIS — Z79899 Other long term (current) drug therapy: Secondary | ICD-10-CM

## 2017-11-08 DIAGNOSIS — Z85048 Personal history of other malignant neoplasm of rectum, rectosigmoid junction, and anus: Secondary | ICD-10-CM | POA: Diagnosis not present

## 2017-11-08 DIAGNOSIS — Z85038 Personal history of other malignant neoplasm of large intestine: Secondary | ICD-10-CM

## 2017-11-08 DIAGNOSIS — G629 Polyneuropathy, unspecified: Secondary | ICD-10-CM | POA: Diagnosis not present

## 2017-11-08 DIAGNOSIS — E876 Hypokalemia: Secondary | ICD-10-CM

## 2017-11-08 MED ORDER — GABAPENTIN 300 MG PO CAPS: ORAL_CAPSULE | ORAL | 3 refills | Status: DC

## 2017-11-08 NOTE — Progress Notes (Signed)
Hematology and Oncology Follow Up Visit  Vincent Strickland [id number] July 14, 1965 52 y.o. 11/08/2017   Principle Diagnosis:  Stage IIIC ([id number]) adenocarcinoma of the upper rectum  Current Therapy:   FOLFOX s/p cycle #7 - completed on 04/21/2016 Radiation with Xeloda - started on 05/10/2016 FOLFIRI - to complete all of his chemotherapy 09/2016    Interim History:  Vincent Strickland is here today for follow-up.   Unfortunately, he is a little bit late today because his father passed out today.  He is in the emergency room right now.  Hopefully, everything will go okay for him.  He is doing well.  We last saw him 6 months ago.  He had his CT scan done a week ago.  This is done at [facility] for cost issues.  Thankfully, the CT scan did not show any evidence of recurrent disease.  His CEA was 1.04.  His neuropathy is still troublesome.  It is getting a little bit better.  His family doctor increased his gabapentin up to 300 mg twice daily and then 600 mg nightly.  There is been no bleeding.  Is had no cough or shortness of breath.  He has had no rashes.  He has had no leg swelling.  He is still working full-time.  He is enjoying this.  Overall, his performance status is ECOG 1.  Medications:  Allergies as of 11/08/2017      Reactions   Niaspan [niacin]    Severe flushing   Other Other (See Comments)   After port placement pt had severe skin reaction in armpits and neck area "could see brush red brush marks, itched, burned and skin peeled off , severe burn"   Percocet [oxycodone-acetaminophen] Nausea Only   hallucinations      Medication List        Accurate as of 11/08/17  3:11 PM. Always use your most recent med list.          acetaminophen 500 MG tablet Commonly known as:  TYLENOL Take 1,000 mg by mouth every 6 (six) hours as needed (for pain.).   BENFOTIAMINE MULTI-B PO Take by mouth.   gabapentin 300 MG capsule Commonly known as:  NEURONTIN TAKE 2 CAPSULES BY MOUTH EVERY  DAY AT BEDTIME   KLOR-CON M20 20 MEQ tablet Generic drug:  potassium chloride SA TAKE 2 TABLETS BY MOUTH TWICE A DAY   losartan-hydrochlorothiazide 100-25 MG tablet Commonly known as:  HYZAAR TAKE 1 TABLET BY MOUTH EVERY DAY   PROBIOTIC PO Take by mouth.   vitamin B-6 250 MG tablet Take 2 tablets (500 mg total) by mouth daily.       Allergies:  Allergies  Allergen Reactions  . Niaspan [Niacin]     Severe flushing  . Other Other (See Comments)    After port placement pt had severe skin reaction in armpits and neck area "could see brush red brush marks, itched, burned and skin peeled off , severe burn"  . Percocet [Oxycodone-Acetaminophen] Nausea Only    hallucinations    Past Medical History, Surgical history, Social history, and Family History were reviewed and updated.  Review of Systems: Review of Systems  Constitutional: Negative.   HENT: Negative.   Eyes: Negative.   Respiratory: Negative.   Cardiovascular: Negative.   Gastrointestinal: Negative.   Genitourinary: Negative.   Musculoskeletal: Negative.   Skin: Negative.   Neurological: Positive for tingling.  Endo/Heme/Allergies: Negative.   Psychiatric/Behavioral: Negative.      Physical Exam:  weight is 257  lb 1.3 oz (116.6 kg). His oral temperature is 98.3 F (36.8 C). His blood pressure is 119/95 (abnormal) and his pulse is 85. His respiration is 21 (abnormal) and oxygen saturation is 98%.   Wt Readings from Last 3 Encounters:  11/08/17 257 lb 1.3 oz (116.6 kg)  07/03/17 255 lb (115.7 kg)  05/11/17 256 lb (116.1 kg)    Physical Exam  Constitutional: He is oriented to person, place, and time.  HENT:  Head: Normocephalic and atraumatic.  Mouth/Throat: Oropharynx is clear and moist.  Eyes: Pupils are equal, round, and reactive to light. EOM are normal.  Neck: Normal range of motion.  Cardiovascular: Normal rate, regular rhythm and normal heart sounds.  Pulmonary/Chest: Effort normal and breath  sounds normal.  Abdominal: Soft. Bowel sounds are normal.  Musculoskeletal: Normal range of motion. He exhibits no edema, tenderness or deformity.  Lymphadenopathy:    He has no cervical adenopathy.  Neurological: He is alert and oriented to person, place, and time.  Skin: Skin is warm and dry. No rash noted. No erythema.  Psychiatric: He has a normal mood and affect. His behavior is normal. Judgment and thought content normal.  Vitals reviewed.    Lab Results  Component Value Date   WBC 4.9 11/03/2017   HGB 15.2 11/03/2017   HCT 43.2 11/03/2017   MCV 89.8 11/03/2017   PLT 229 11/03/2017   Lab Results  Component Value Date   FERRITIN 34 01/13/2016   IRON 41 (L) 01/13/2016   TIBC 369 01/13/2016   UIBC 328 01/13/2016   IRONPCTSAT 11 (L) 01/13/2016   Lab Results  Component Value Date   RBC 4.81 11/03/2017   No results found for: KPAFRELGTCHN, LAMBDASER, KAPLAMBRATIO No results found for: IGGSERUM, IGA, IGMSERUM No results found for: Odetta Pink, SPEI   Chemistry      Component Value Date/Time   NA 141 11/03/2017 1021   NA 146 (H) 12/29/2016 0838   K 3.8 11/03/2017 1021   K 3.6 12/29/2016 0838   CL 107 11/03/2017 1021   CL 105 12/29/2016 0838   CO2 33 11/03/2017 1021   CO2 29 12/29/2016 0838   BUN 9 11/03/2017 1021   BUN 11 12/29/2016 0838   CREATININE 0.90 11/03/2017 1021   CREATININE 0.9 12/29/2016 0838      Component Value Date/Time   CALCIUM 9.7 11/03/2017 1021   CALCIUM 9.9 12/29/2016 0838   ALKPHOS 65 11/03/2017 1021   ALKPHOS 71 12/29/2016 0838   AST 30 11/03/2017 1021   ALT 50 (H) 11/03/2017 1021   ALT 46 12/29/2016 0838   BILITOT 0.9 11/03/2017 1021     Impression and Plan: Vincent Strickland is a pleasant 52 yo gentleman with 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  He had radiation and chemotherapy.  We did a "sandwich protocol".  He did quite well with this.  We  completed treatment back in September 2018.  We will continue to scan him every 6 months.  I will plan to see him back in 6 months.  We get him back the week after he has his labs and scans.  [name], MD 10/15/20193:11 PM

## 2017-11-10 ENCOUNTER — Ambulatory Visit: Payer: Self-pay | Admitting: Hematology & Oncology

## 2018-01-31 ENCOUNTER — Other Ambulatory Visit: Payer: Self-pay | Admitting: Hematology & Oncology

## 2018-01-31 DIAGNOSIS — Z85038 Personal history of other malignant neoplasm of large intestine: Secondary | ICD-10-CM

## 2018-01-31 DIAGNOSIS — E876 Hypokalemia: Secondary | ICD-10-CM

## 2018-04-13 ENCOUNTER — Telehealth: Payer: Self-pay | Admitting: Hematology & Oncology

## 2018-04-13 NOTE — Telephone Encounter (Signed)
SPOKE WITH PATIENT TO CONFIRM [facility] HAS RECEIVED HIS ORDERS FOR CT SCANS AND HE NEED TO CONTACT THEM TO [facility]PTS BEFORE 4/15. [place] PATIENT PHONE NUMBER [phone]

## 2018-04-26 ENCOUNTER — Other Ambulatory Visit: Payer: Self-pay

## 2018-04-26 ENCOUNTER — Inpatient Hospital Stay: Payer: Managed Care, Other (non HMO) | Attending: Hematology & Oncology

## 2018-04-26 DIAGNOSIS — Z9221 Personal history of antineoplastic chemotherapy: Secondary | ICD-10-CM | POA: Diagnosis not present

## 2018-04-26 DIAGNOSIS — Z79899 Other long term (current) drug therapy: Secondary | ICD-10-CM | POA: Insufficient documentation

## 2018-04-26 DIAGNOSIS — Z923 Personal history of irradiation: Secondary | ICD-10-CM | POA: Diagnosis not present

## 2018-04-26 DIAGNOSIS — Z85038 Personal history of other malignant neoplasm of large intestine: Secondary | ICD-10-CM

## 2018-04-26 DIAGNOSIS — G629 Polyneuropathy, unspecified: Secondary | ICD-10-CM | POA: Diagnosis not present

## 2018-04-26 LAB — CBC WITH DIFFERENTIAL (CANCER CENTER ONLY)
Abs Immature Granulocytes: 0.01 10*3/uL (ref 0.00–0.07)
Basophils Absolute: 0.1 10*3/uL (ref 0.0–0.1)
Basophils Relative: 1 %
Eosinophils Absolute: 0.1 10*3/uL (ref 0.0–0.5)
Eosinophils Relative: 2 %
HCT: 44 % (ref 39.0–52.0)
Hemoglobin: 15.8 g/dL (ref 13.0–17.0)
Immature Granulocytes: 0 %
Lymphocytes Relative: 26 %
Lymphs Abs: 1.4 10*3/uL (ref 0.7–4.0)
MCH: 32.5 pg (ref 26.0–34.0)
MCHC: 35.9 g/dL (ref 30.0–36.0)
MCV: 90.5 fL (ref 80.0–100.0)
Monocytes Absolute: 0.4 10*3/uL (ref 0.1–1.0)
Monocytes Relative: 8 %
Neutro Abs: 3.3 10*3/uL (ref 1.7–7.7)
Neutrophils Relative %: 63 %
Platelet Count: 252 10*3/uL (ref 150–400)
RBC: 4.86 MIL/uL (ref 4.22–5.81)
RDW: 12.6 % (ref 11.5–15.5)
WBC Count: 5.2 10*3/uL (ref 4.0–10.5)
nRBC: 0 % (ref 0.0–0.2)

## 2018-04-26 LAB — CMP (CANCER CENTER ONLY)
ALT: 56 U/L — ABNORMAL HIGH (ref 0–44)
AST: 28 U/L (ref 15–41)
Albumin: 4.9 g/dL (ref 3.5–5.0)
Alkaline Phosphatase: 68 U/L (ref 38–126)
Anion gap: 9 (ref 5–15)
BUN: 8 mg/dL (ref 6–20)
CO2: 32 mmol/L (ref 22–32)
Calcium: 9.7 mg/dL (ref 8.9–10.3)
Chloride: 102 mmol/L (ref 98–111)
Creatinine: 0.9 mg/dL (ref 0.61–1.24)
GFR, Est AFR Am: >60 mL/min (ref >60)
GFR, Estimated: >60 mL/min (ref >60)
Glucose, Bld: 87 mg/dL (ref 70–99)
Potassium: 3.4 mmol/L — ABNORMAL LOW (ref 3.5–5.1)
Sodium: 143 mmol/L (ref 135–145)
Total Bilirubin: 0.9 mg/dL (ref 0.3–1.2)
Total Protein: 7.6 g/dL (ref 6.5–8.1)

## 2018-04-26 LAB — LACTATE DEHYDROGENASE: LDH: 220 U/L — ABNORMAL HIGH (ref 98–192)

## 2018-04-27 ENCOUNTER — Telehealth: Payer: Self-pay

## 2018-04-27 LAB — CEA (IN HOUSE-CHCC): CEA (CHCC-In House): 1.41 ng/mL (ref 0.00–5.00)

## 2018-04-27 NOTE — Telephone Encounter (Addendum)
  Attached message given to pt via phone. Pt verbalizes understanding and appreciation. dph  ----- Message from [name], MD sent at 04/27/2018 12:37 PM EDT ----- Call - the CEA  Is only 1.41!!  Great job!!  Laurey Arrow

## 2018-05-03 ENCOUNTER — Telehealth: Payer: Self-pay | Admitting: *Deleted

## 2018-05-03 NOTE — Telephone Encounter (Signed)
Pt notified per order of Dr. Marin Olp that CT scan shows no recurrent disease.  Pt appreciative of call and has no questions or concerns at this time.

## 2018-05-10 ENCOUNTER — Encounter: Payer: Self-pay | Admitting: Hematology & Oncology

## 2018-05-10 ENCOUNTER — Inpatient Hospital Stay (HOSPITAL_BASED_OUTPATIENT_CLINIC_OR_DEPARTMENT_OTHER): Payer: Managed Care, Other (non HMO) | Admitting: Hematology & Oncology

## 2018-05-10 ENCOUNTER — Other Ambulatory Visit: Payer: Self-pay

## 2018-05-10 VITALS — BP 138/102 | HR 87 | Temp 98.2°F | Resp 20 | Wt 257.1 lb

## 2018-05-10 DIAGNOSIS — C772 Secondary and unspecified malignant neoplasm of intra-abdominal lymph nodes: Principal | ICD-10-CM

## 2018-05-10 DIAGNOSIS — Z9221 Personal history of antineoplastic chemotherapy: Secondary | ICD-10-CM

## 2018-05-10 DIAGNOSIS — G629 Polyneuropathy, unspecified: Secondary | ICD-10-CM | POA: Diagnosis not present

## 2018-05-10 DIAGNOSIS — Z923 Personal history of irradiation: Secondary | ICD-10-CM

## 2018-05-10 DIAGNOSIS — C187 Malignant neoplasm of sigmoid colon: Secondary | ICD-10-CM

## 2018-05-10 DIAGNOSIS — Z85038 Personal history of other malignant neoplasm of large intestine: Secondary | ICD-10-CM

## 2018-05-10 DIAGNOSIS — Z79899 Other long term (current) drug therapy: Secondary | ICD-10-CM

## 2018-05-10 NOTE — Progress Notes (Signed)
Hematology and Oncology Follow Up Visit  Vincent Strickland [id number] 05/25/1965 53 y.o. 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 Andersson is here today for follow-up.   He is doing quite well.  He is working from home given the coronavirus.  He has had no problems since we saw him 6 months ago.  He got through the holidays without any difficulties.  He has had no issues with nausea or vomiting.  He has had no abdominal pain.  There might be some occasional abdominal pain over on the left side of his abdomen.  This comes and goes.  He is worried about his cholesterol.  It is on the high side.  He cannot tolerate statin drugs.  He did have a CT scan done at [facility].  This was done a week ago.  There is no evidence of recurrent colon cancer.  His last CEA done on 04/26/2018 was only 1.4.  He has had no change in bowel or bladder habits.  He has had no diarrhea.  There has been no rectal bleeding.  He has had no leg swelling.  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  The neuropathy in his hands has improved.  Overall, his performance status is ECOG 1.  Medications:  Allergies as of 05/10/2018      Reactions   Niaspan [niacin]    Severe flushing   Other Other (See Comments)   After port placement pt had severe skin reaction in armpits and neck area "could see brush red brush marks, itched, burned and skin peeled off , severe burn"   Percocet [oxycodone-acetaminophen] Nausea Only   hallucinations      Medication List       Accurate as of May 10, 2018  1:17 PM. Always use your most recent med list.        acetaminophen 500 MG tablet Commonly known as:  TYLENOL Take 1,000 mg by mouth every 6 (six) hours as needed (for pain.).    BENFOTIAMINE MULTI-B PO Take by mouth.   gabapentin 300 MG capsule Commonly known as:  NEURONTIN Take 1 capsule twice daily and then take 2 capsules at bedtime.   irbesartan-hydrochlorothiazide 300-12.5 MG tablet Commonly known as:  AVALIDE Take 1 tablet by mouth daily.   Klor-Con M20 20 MEQ tablet Generic drug:  potassium chloride SA TAKE 2 TABLETS BY MOUTH TWICE A DAY   losartan-hydrochlorothiazide 100-25 MG tablet Commonly known as:  HYZAAR TAKE 1 TABLET BY MOUTH EVERY DAY   PROBIOTIC PO Take by mouth.   vitamin B-6 250 MG tablet Take 2 tablets (500 mg total) by mouth daily.       Allergies:  Allergies  Allergen Reactions  . Niaspan [Niacin]     Severe flushing  . Other Other (See Comments)    After port placement pt had severe skin reaction in armpits and neck area "could see brush red brush marks, itched, burned and skin peeled off , severe burn"  . Percocet [Oxycodone-Acetaminophen] Nausea Only    hallucinations    Past Medical History, Surgical history, Social history, and Family History were reviewed and updated.  Review of Systems: Review of Systems  Constitutional: Negative.   HENT: Negative.   Eyes: Negative.  Respiratory: Negative.   Cardiovascular: Negative.   Gastrointestinal: Negative.   Genitourinary: Negative.   Musculoskeletal: Negative.   Skin: Negative.   Neurological: Positive for tingling.  Endo/Heme/Allergies: Negative.   Psychiatric/Behavioral: Negative.      Physical Exam:  weight is 257 lb 1.9 oz (116.6 kg). His oral temperature is 98.2 F (36.8 C). His blood pressure is 138/102 (abnormal) and his pulse is 87. His respiration is 20 and oxygen saturation is 99%.   Wt Readings from Last 3 Encounters:  05/10/18 257 lb 1.9 oz (116.6 kg)  11/08/17 257 lb 1.3 oz (116.6 kg)  07/03/17 255 lb (115.7 kg)    Physical Exam Vitals signs reviewed.  HENT:     Head: Normocephalic and atraumatic.  Eyes:     Pupils: Pupils are equal,  round, and reactive to light.  Neck:     Musculoskeletal: Normal range of motion.  Cardiovascular:     Rate and Rhythm: Normal rate and regular rhythm.     Heart sounds: Normal heart sounds.  Pulmonary:     Effort: Pulmonary effort is normal.     Breath sounds: Normal breath sounds.  Abdominal:     General: Bowel sounds are normal.     Palpations: Abdomen is soft.  Musculoskeletal: Normal range of motion.        General: No tenderness or deformity.  Lymphadenopathy:     Cervical: No cervical adenopathy.  Skin:    General: Skin is warm and dry.     Findings: No erythema or rash.  Neurological:     Mental Status: He is alert and oriented to person, place, and time.  Psychiatric:        Behavior: Behavior normal.        Thought Content: Thought content normal.        Judgment: Judgment normal.      Lab Results  Component Value Date   WBC 5.2 04/26/2018   HGB 15.8 04/26/2018   HCT 44.0 04/26/2018   MCV 90.5 04/26/2018   PLT 252 04/26/2018   Lab Results  Component Value Date   FERRITIN 34 01/13/2016   IRON 41 (L) 01/13/2016   TIBC 369 01/13/2016   UIBC 328 01/13/2016   IRONPCTSAT 11 (L) 01/13/2016   Lab Results  Component Value Date   RBC 4.86 04/26/2018   No results found for: KPAFRELGTCHN, LAMBDASER, KAPLAMBRATIO No results found for: IGGSERUM, IGA, IGMSERUM No results found for: Odetta Pink, SPEI   Chemistry      Component Value Date/Time   NA 143 04/26/2018 1130   NA 146 (H) 12/29/2016 0838   K 3.4 (L) 04/26/2018 1130   K 3.6 12/29/2016 0838   CL 102 04/26/2018 1130   CL 105 12/29/2016 0838   CO2 32 04/26/2018 1130   CO2 29 12/29/2016 0838   BUN 8 04/26/2018 1130   BUN 11 12/29/2016 0838   CREATININE 0.90 04/26/2018 1130   CREATININE 0.9 12/29/2016 0838      Component Value Date/Time   CALCIUM 9.7 04/26/2018 1130   CALCIUM 9.9 12/29/2016 0838   ALKPHOS 68 04/26/2018 1130   ALKPHOS 71  12/29/2016 0838   AST 28 04/26/2018 1130   ALT 56 (H) 04/26/2018 1130   ALT 46 12/29/2016 0838   BILITOT 0.9 04/26/2018 1130     Impression and Plan: Mr. Hellinger is a pleasant 53 yo gentleman with 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  He had  radiation and chemotherapy.  We did a "sandwich protocol".  He did quite well with this.  We completed treatment back in September 2018.  We will go ahead and do another CT scan in 6 months.  I think this is indicated given the fact that he had locally advanced colon cancer and needed aggressive therapy.  We will have lab work done before I see him back.  I really think that his chance of recurrence is gone be less than 10%.  [name], MD 4/15/20201:17 PM

## 2018-05-12 ENCOUNTER — Other Ambulatory Visit: Payer: Self-pay | Admitting: Hematology & Oncology

## 2018-05-12 DIAGNOSIS — E876 Hypokalemia: Secondary | ICD-10-CM

## 2018-05-12 DIAGNOSIS — Z85038 Personal history of other malignant neoplasm of large intestine: Secondary | ICD-10-CM

## 2018-07-03 ENCOUNTER — Other Ambulatory Visit: Payer: Self-pay | Admitting: Hematology & Oncology

## 2018-07-03 DIAGNOSIS — Z85038 Personal history of other malignant neoplasm of large intestine: Secondary | ICD-10-CM

## 2018-07-03 DIAGNOSIS — E876 Hypokalemia: Secondary | ICD-10-CM

## 2018-10-16 ENCOUNTER — Other Ambulatory Visit: Payer: Self-pay | Admitting: *Deleted

## 2018-10-26 ENCOUNTER — Other Ambulatory Visit: Payer: Managed Care, Other (non HMO)

## 2018-10-26 ENCOUNTER — Inpatient Hospital Stay: Payer: Managed Care, Other (non HMO)

## 2018-10-27 ENCOUNTER — Telehealth: Payer: Self-pay

## 2018-10-27 ENCOUNTER — Inpatient Hospital Stay: Payer: Managed Care, Other (non HMO) | Attending: Hematology & Oncology

## 2018-10-27 ENCOUNTER — Other Ambulatory Visit: Payer: Self-pay

## 2018-10-27 DIAGNOSIS — Z79899 Other long term (current) drug therapy: Secondary | ICD-10-CM | POA: Insufficient documentation

## 2018-10-27 DIAGNOSIS — Z9221 Personal history of antineoplastic chemotherapy: Secondary | ICD-10-CM | POA: Diagnosis not present

## 2018-10-27 DIAGNOSIS — G629 Polyneuropathy, unspecified: Secondary | ICD-10-CM | POA: Insufficient documentation

## 2018-10-27 DIAGNOSIS — C187 Malignant neoplasm of sigmoid colon: Secondary | ICD-10-CM | POA: Diagnosis present

## 2018-10-27 DIAGNOSIS — C772 Secondary and unspecified malignant neoplasm of intra-abdominal lymph nodes: Secondary | ICD-10-CM | POA: Diagnosis not present

## 2018-10-27 DIAGNOSIS — Z923 Personal history of irradiation: Secondary | ICD-10-CM | POA: Insufficient documentation

## 2018-10-27 DIAGNOSIS — N2889 Other specified disorders of kidney and ureter: Secondary | ICD-10-CM | POA: Insufficient documentation

## 2018-10-27 LAB — CMP (CANCER CENTER ONLY)
ALT: 39 U/L (ref 0–44)
AST: 22 U/L (ref 15–41)
Albumin: 4.7 g/dL (ref 3.5–5.0)
Alkaline Phosphatase: 83 U/L (ref 38–126)
Anion gap: 8 (ref 5–15)
BUN: 15 mg/dL (ref 6–20)
CO2: 31 mmol/L (ref 22–32)
Calcium: 10.2 mg/dL (ref 8.9–10.3)
Chloride: 106 mmol/L (ref 98–111)
Creatinine: 1.01 mg/dL (ref 0.61–1.24)
GFR, Est AFR Am: >60 mL/min (ref >60)
GFR, Estimated: >60 mL/min (ref >60)
Glucose, Bld: 136 mg/dL — ABNORMAL HIGH (ref 70–99)
Potassium: 3.7 mmol/L (ref 3.5–5.1)
Sodium: 145 mmol/L (ref 135–145)
Total Bilirubin: 0.7 mg/dL (ref 0.3–1.2)
Total Protein: 7.4 g/dL (ref 6.5–8.1)

## 2018-10-27 LAB — CEA (IN HOUSE-CHCC): CEA (CHCC-In House): 1.05 ng/mL (ref 0.00–5.00)

## 2018-10-27 LAB — CBC WITH DIFFERENTIAL (CANCER CENTER ONLY)
Abs Immature Granulocytes: 0.01 10*3/uL (ref 0.00–0.07)
Basophils Absolute: 0.1 10*3/uL (ref 0.0–0.1)
Basophils Relative: 1 %
Eosinophils Absolute: 0.1 10*3/uL (ref 0.0–0.5)
Eosinophils Relative: 2 %
HCT: 43.9 % (ref 39.0–52.0)
Hemoglobin: 15.6 g/dL (ref 13.0–17.0)
Immature Granulocytes: 0 %
Lymphocytes Relative: 20 %
Lymphs Abs: 1 10*3/uL (ref 0.7–4.0)
MCH: 32 pg (ref 26.0–34.0)
MCHC: 35.5 g/dL (ref 30.0–36.0)
MCV: 90.1 fL (ref 80.0–100.0)
Monocytes Absolute: 0.4 10*3/uL (ref 0.1–1.0)
Monocytes Relative: 8 %
Neutro Abs: 3.5 10*3/uL (ref 1.7–7.7)
Neutrophils Relative %: 69 %
Platelet Count: 232 10*3/uL (ref 150–400)
RBC: 4.87 MIL/uL (ref 4.22–5.81)
RDW: 12.9 % (ref 11.5–15.5)
WBC Count: 5.1 10*3/uL (ref 4.0–10.5)
nRBC: 0 % (ref 0.0–0.2)

## 2018-10-27 LAB — LACTATE DEHYDROGENASE: LDH: 172 U/L (ref 98–192)

## 2018-10-27 NOTE — Telephone Encounter (Addendum)
-----   Message from [name], MD sent at 10/27/2018  3:16 PM EDT ----- Call - the CEA level is down to 1.05!!  Laurey Arrow  Above message left on pt's home VM with instructions to contact our office with questions/concerns. dph

## 2018-11-02 ENCOUNTER — Other Ambulatory Visit: Payer: Self-pay | Admitting: Hematology & Oncology

## 2018-11-02 DIAGNOSIS — N2889 Other specified disorders of kidney and ureter: Secondary | ICD-10-CM

## 2018-11-09 ENCOUNTER — Inpatient Hospital Stay (HOSPITAL_BASED_OUTPATIENT_CLINIC_OR_DEPARTMENT_OTHER): Payer: Managed Care, Other (non HMO) | Admitting: Hematology & Oncology

## 2018-11-09 ENCOUNTER — Telehealth: Payer: Self-pay | Admitting: Hematology & Oncology

## 2018-11-09 ENCOUNTER — Other Ambulatory Visit: Payer: Self-pay

## 2018-11-09 ENCOUNTER — Encounter: Payer: Self-pay | Admitting: Hematology & Oncology

## 2018-11-09 VITALS — BP 132/91 | HR 77 | Temp 98.4°F | Resp 18 | Wt 251.0 lb

## 2018-11-09 DIAGNOSIS — C772 Secondary and unspecified malignant neoplasm of intra-abdominal lymph nodes: Secondary | ICD-10-CM | POA: Diagnosis not present

## 2018-11-09 DIAGNOSIS — E876 Hypokalemia: Secondary | ICD-10-CM | POA: Diagnosis not present

## 2018-11-09 DIAGNOSIS — Z85038 Personal history of other malignant neoplasm of large intestine: Secondary | ICD-10-CM

## 2018-11-09 DIAGNOSIS — C187 Malignant neoplasm of sigmoid colon: Secondary | ICD-10-CM

## 2018-11-09 MED ORDER — POTASSIUM CHLORIDE CRYS ER 20 MEQ PO TBCR: 20.0000 meq | EXTENDED_RELEASE_TABLET | Freq: Two times a day (BID) | ORAL | 4 refills | Status: DC

## 2018-11-09 NOTE — Progress Notes (Signed)
Hematology and Oncology Follow Up Visit  Vincent Strickland Vincent Strickland:[id number] [birth year] 53 y.o. 11/09/2018   Principle Diagnosis:  Stage IIIC [id number]) adenocarcinoma of the upper rectum  Current Therapy:   FOLFOX s/p cycle #7 - completed on 04/21/2016 Radiation with Xeloda - started on 05/10/2016 FOLFIRI - to complete all of his chemotherapy 09/2016    Interim History:  Vincent Strickland is here today for follow-up.   He is doing quite well.  We dodged a real "bullet" with his CT scan.  He had his CT scan done at [facility].  CT scan did not show any evidence of recurrent colon cancer.  However, there was an issue with his left kidney.  This seemed to be a mass in the left kidney.  I then got a MRI of the kidneys.  Thankfully, the MRI only showed what was thought to be a hemorrhagic cyst in the left kidney.  It measured 1.6 x 1.7 x 1.5 cm.  His CEA has always been normal.  We will recheck to 2 weeks ago the CEA was 1.04.  He still has the neuropathy.  This is stable.  He is on Neurontin for this.  He is able to work.  He does a lot of computer work.  I think he still works from home.  He has had no problems with his appetite.  He is eating well.  He has had no nausea or vomiting.  There is been no bleeding.  Overall, his performance status is ECOG 0.   Medications:  Allergies as of 11/09/2018      Reactions   Niaspan [niacin]    Severe flushing   Other Other (See Comments)   After port placement pt had severe skin reaction in armpits and neck area "could see brush red brush marks, itched, burned and skin peeled off , severe burn"   Percocet [oxycodone-acetaminophen] Nausea Only   hallucinations      Medication List       Accurate as of November 09, 2018 12:53 PM. If you have any questions, ask your nurse or doctor.        acetaminophen 500 MG tablet Commonly known as: TYLENOL Take 1,000 mg by mouth every 6 (six) hours as needed (for pain.).   BENFOTIAMINE MULTI-B PO Take  by mouth.   gabapentin 300 MG capsule Commonly known as: NEURONTIN TAKE 2 CAPSULES BY MOUTH EVERY DAY AT BEDTIME   irbesartan-hydrochlorothiazide 300-12.5 MG tablet Commonly known as: AVALIDE Take 1 tablet by mouth daily.   Klor-Con M20 20 MEQ tablet Generic drug: potassium chloride SA TAKE 2 TABLETS BY MOUTH TWICE A DAY   losartan-hydrochlorothiazide 100-25 MG tablet Commonly known as: HYZAAR TAKE 1 TABLET BY MOUTH EVERY DAY   PROBIOTIC PO Take by mouth.   vitamin B-6 250 MG tablet Take 2 tablets (500 mg total) by mouth daily.       Allergies:  Allergies  Allergen Reactions  . Niaspan [Niacin]     Severe flushing  . Other Other (See Comments)    After port placement pt had severe skin reaction in armpits and neck area "could see brush red brush marks, itched, burned and skin peeled off , severe burn"  . Percocet [Oxycodone-Acetaminophen] Nausea Only    hallucinations    Past Medical History, Surgical history, Social history, and Family History were reviewed and updated.  Review of Systems: Review of Systems  Constitutional: Negative.   HENT: Negative.   Eyes: Negative.   Respiratory: Negative.  Cardiovascular: Negative.   Gastrointestinal: Negative.   Genitourinary: Negative.   Musculoskeletal: Negative.   Skin: Negative.   Neurological: Positive for tingling.  Endo/Heme/Allergies: Negative.   Psychiatric/Behavioral: Negative.      Physical Exam:  weight is 251 lb (113.9 kg). His oral temperature is 98.4 F (36.9 C). His blood pressure is 132/91 (abnormal) and his pulse is 77. His respiration is 18 and oxygen saturation is 99%.   Wt Readings from Last 3 Encounters:  11/09/18 251 lb (113.9 kg)  05/10/18 257 lb 1.9 oz (116.6 kg)  11/08/17 257 lb 1.3 oz (116.6 kg)    Physical Exam Vitals signs reviewed.  HENT:     Head: Normocephalic and atraumatic.  Eyes:     Pupils: Pupils are equal, round, and reactive to light.  Neck:     Musculoskeletal:  Normal range of motion.  Cardiovascular:     Rate and Rhythm: Normal rate and regular rhythm.     Heart sounds: Normal heart sounds.  Pulmonary:     Effort: Pulmonary effort is normal.     Breath sounds: Normal breath sounds.  Abdominal:     General: Bowel sounds are normal.     Palpations: Abdomen is soft.  Musculoskeletal: Normal range of motion.        General: No tenderness or deformity.  Lymphadenopathy:     Cervical: No cervical adenopathy.  Skin:    General: Skin is warm and dry.     Findings: No erythema or rash.  Neurological:     Mental Status: He is alert and oriented to person, place, and time.  Psychiatric:        Behavior: Behavior normal.        Thought Content: Thought content normal.        Judgment: Judgment normal.      Lab Results  Component Value Date   WBC 5.1 10/27/2018   HGB 15.6 10/27/2018   HCT 43.9 10/27/2018   MCV 90.1 10/27/2018   PLT 232 10/27/2018   Lab Results  Component Value Date   FERRITIN 34 01/13/2016   IRON 41 (L) 01/13/2016   TIBC 369 01/13/2016   UIBC 328 01/13/2016   IRONPCTSAT 11 (L) 01/13/2016   Lab Results  Component Value Date   RBC 4.87 10/27/2018   No results found for: KPAFRELGTCHN, LAMBDASER, KAPLAMBRATIO No results found for: IGGSERUM, IGA, IGMSERUM No results found for: Odetta Pink, SPEI   Chemistry      Component Value Date/Time   NA 145 10/27/2018 0816   NA 146 (H) 12/29/2016 0838   K 3.7 10/27/2018 0816   K 3.6 12/29/2016 0838   CL 106 10/27/2018 0816   CL 105 12/29/2016 0838   CO2 31 10/27/2018 0816   CO2 29 12/29/2016 0838   BUN 15 10/27/2018 0816   BUN 11 12/29/2016 0838   CREATININE 1.01 10/27/2018 0816   CREATININE 0.9 12/29/2016 0838      Component Value Date/Time   CALCIUM 10.2 10/27/2018 0816   CALCIUM 9.9 12/29/2016 0838   ALKPHOS 83 10/27/2018 0816   ALKPHOS 71 12/29/2016 0838   AST 22 10/27/2018 0816   ALT 39 10/27/2018 0816    ALT 46 12/29/2016 0838   BILITOT 0.7 10/27/2018 0816     Impression and Plan: Vincent Strickland is a pleasant 53 yo gentleman with 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  He had radiation and chemotherapy.  We did a "sandwich  protocol".  He did quite well with this.  We completed treatment back in September 2018.  We will go ahead and do another CT scan in 6 months.  I think this is indicated given the fact that he had locally advanced colon cancer and needed aggressive therapy.  Hopefully, we will redo his CT scan in 6 months, this will be the last scan that we have to do since it will be over 3 years then he was diagnosed.  I think we still have to be cautious with this left renal issue.  Again, it does not look like it is malignant.  I would be shocked if it was malignant.  However, we will maintain follow-up for this.  He was comes in for lab work before he has his scans done.  He has a scans done at [facility].  I spent about 30 minutes with him today.  I had to go over his CT scan and MRI and review the left renal issue.  I reassured him that his risk of recurrence of his colon cancer should be less than 10% at this point.   [name], MD 10/15/202012:53 PM

## 2018-11-09 NOTE — Telephone Encounter (Signed)
Appointments scheduled/ CT orders have been faxed to [facility] [phone]

## 2018-11-26 ENCOUNTER — Other Ambulatory Visit: Payer: Self-pay | Admitting: Hematology & Oncology

## 2018-11-26 DIAGNOSIS — E876 Hypokalemia: Secondary | ICD-10-CM

## 2018-11-26 DIAGNOSIS — Z85038 Personal history of other malignant neoplasm of large intestine: Secondary | ICD-10-CM

## 2019-04-24 ENCOUNTER — Other Ambulatory Visit: Payer: Self-pay | Admitting: *Deleted

## 2019-04-24 ENCOUNTER — Telehealth: Payer: Self-pay | Admitting: *Deleted

## 2019-04-24 NOTE — Telephone Encounter (Signed)
Message received from patient to review appt times with him.  Call placed back to patient and appt times reviewed with pt.  Pt requests that CT scans be done prior to appt with Dr Marin Olp on 05/10/19.  Pt requests that scans be done at [place].  Dr. Marin Olp notified.

## 2019-04-25 ENCOUNTER — Other Ambulatory Visit: Payer: Self-pay | Admitting: Family

## 2019-04-25 DIAGNOSIS — C772 Secondary and unspecified malignant neoplasm of intra-abdominal lymph nodes: Secondary | ICD-10-CM

## 2019-04-25 DIAGNOSIS — C187 Malignant neoplasm of sigmoid colon: Secondary | ICD-10-CM

## 2019-04-26 ENCOUNTER — Other Ambulatory Visit: Payer: Self-pay | Admitting: *Deleted

## 2019-04-26 ENCOUNTER — Telehealth: Payer: Self-pay | Admitting: *Deleted

## 2019-04-26 ENCOUNTER — Inpatient Hospital Stay: Payer: Managed Care, Other (non HMO) | Attending: Hematology & Oncology

## 2019-04-26 ENCOUNTER — Other Ambulatory Visit: Payer: Self-pay

## 2019-04-26 DIAGNOSIS — Z923 Personal history of irradiation: Secondary | ICD-10-CM | POA: Insufficient documentation

## 2019-04-26 DIAGNOSIS — G629 Polyneuropathy, unspecified: Secondary | ICD-10-CM | POA: Insufficient documentation

## 2019-04-26 DIAGNOSIS — Z79899 Other long term (current) drug therapy: Secondary | ICD-10-CM | POA: Insufficient documentation

## 2019-04-26 DIAGNOSIS — Z85048 Personal history of other malignant neoplasm of rectum, rectosigmoid junction, and anus: Secondary | ICD-10-CM | POA: Insufficient documentation

## 2019-04-26 DIAGNOSIS — C772 Secondary and unspecified malignant neoplasm of intra-abdominal lymph nodes: Secondary | ICD-10-CM

## 2019-04-26 DIAGNOSIS — E876 Hypokalemia: Secondary | ICD-10-CM

## 2019-04-26 DIAGNOSIS — Z9221 Personal history of antineoplastic chemotherapy: Secondary | ICD-10-CM | POA: Diagnosis not present

## 2019-04-26 DIAGNOSIS — Z85038 Personal history of other malignant neoplasm of large intestine: Secondary | ICD-10-CM

## 2019-04-26 DIAGNOSIS — C187 Malignant neoplasm of sigmoid colon: Secondary | ICD-10-CM

## 2019-04-26 LAB — CBC WITH DIFFERENTIAL (CANCER CENTER ONLY)
Abs Immature Granulocytes: 0.02 10*3/uL (ref 0.00–0.07)
Basophils Absolute: 0.1 10*3/uL (ref 0.0–0.1)
Basophils Relative: 1 %
Eosinophils Absolute: 0.1 10*3/uL (ref 0.0–0.5)
Eosinophils Relative: 3 %
HCT: 42.8 % (ref 39.0–52.0)
Hemoglobin: 15.6 g/dL (ref 13.0–17.0)
Immature Granulocytes: 0 %
Lymphocytes Relative: 23 %
Lymphs Abs: 1.2 10*3/uL (ref 0.7–4.0)
MCH: 32.5 pg (ref 26.0–34.0)
MCHC: 36.4 g/dL — ABNORMAL HIGH (ref 30.0–36.0)
MCV: 89.2 fL (ref 80.0–100.0)
Monocytes Absolute: 0.5 10*3/uL (ref 0.1–1.0)
Monocytes Relative: 9 %
Neutro Abs: 3.3 10*3/uL (ref 1.7–7.7)
Neutrophils Relative %: 64 %
Platelet Count: 242 10*3/uL (ref 150–400)
RBC: 4.8 MIL/uL (ref 4.22–5.81)
RDW: 12.7 % (ref 11.5–15.5)
WBC Count: 5.2 10*3/uL (ref 4.0–10.5)
nRBC: 0 % (ref 0.0–0.2)

## 2019-04-26 LAB — CMP (CANCER CENTER ONLY)
ALT: 32 U/L (ref 0–44)
AST: 19 U/L (ref 15–41)
Albumin: 4.4 g/dL (ref 3.5–5.0)
Alkaline Phosphatase: 66 U/L (ref 38–126)
Anion gap: 8 (ref 5–15)
BUN: 15 mg/dL (ref 6–20)
CO2: 32 mmol/L (ref 22–32)
Calcium: 9.9 mg/dL (ref 8.9–10.3)
Chloride: 104 mmol/L (ref 98–111)
Creatinine: 0.92 mg/dL (ref 0.61–1.24)
GFR, Est AFR Am: >60 mL/min (ref >60)
GFR, Estimated: >60 mL/min (ref >60)
Glucose, Bld: 120 mg/dL — ABNORMAL HIGH (ref 70–99)
Potassium: 3.2 mmol/L — ABNORMAL LOW (ref 3.5–5.1)
Sodium: 144 mmol/L (ref 135–145)
Total Bilirubin: 1 mg/dL (ref 0.3–1.2)
Total Protein: 7 g/dL (ref 6.5–8.1)

## 2019-04-26 LAB — CEA (IN HOUSE-CHCC): CEA (CHCC-In House): 1.15 ng/mL (ref 0.00–5.00)

## 2019-04-26 MED ORDER — POTASSIUM CHLORIDE CRYS ER 20 MEQ PO TBCR: 20.0000 meq | EXTENDED_RELEASE_TABLET | Freq: Two times a day (BID) | ORAL | 4 refills | Status: DC

## 2019-04-26 NOTE — Telephone Encounter (Signed)
Call placed to patient to notify him of K-3.2 and to clarify potassium dose that he is taking.  Pt states that he is currently taking potassium 20 meq one tablet BID.  Dr. Marin Olp notified and order received for pt to continue potassium 20 meq one tablet BID.  Pt appreciative of call and has no questions or concerns at this time.

## 2019-05-01 ENCOUNTER — Other Ambulatory Visit: Payer: Self-pay | Admitting: *Deleted

## 2019-05-04 ENCOUNTER — Telehealth: Payer: Self-pay | Admitting: *Deleted

## 2019-05-04 NOTE — Telephone Encounter (Signed)
Patient notified per order of Dr. Marin Olp that the CT of the abd/pelvis shows "no recurrent or metastatic disease detected." Pt appreciative of call and has no questions or concerns at this time.

## 2019-05-10 ENCOUNTER — Inpatient Hospital Stay (HOSPITAL_BASED_OUTPATIENT_CLINIC_OR_DEPARTMENT_OTHER): Payer: Managed Care, Other (non HMO) | Admitting: Hematology & Oncology

## 2019-05-10 ENCOUNTER — Other Ambulatory Visit: Payer: Self-pay

## 2019-05-10 ENCOUNTER — Encounter: Payer: Self-pay | Admitting: Hematology & Oncology

## 2019-05-10 VITALS — BP 108/83 | HR 85 | Temp 97.1°F | Resp 20 | Wt 252.0 lb

## 2019-05-10 DIAGNOSIS — C187 Malignant neoplasm of sigmoid colon: Secondary | ICD-10-CM | POA: Diagnosis not present

## 2019-05-10 DIAGNOSIS — C772 Secondary and unspecified malignant neoplasm of intra-abdominal lymph nodes: Secondary | ICD-10-CM

## 2019-05-10 DIAGNOSIS — Z85048 Personal history of other malignant neoplasm of rectum, rectosigmoid junction, and anus: Secondary | ICD-10-CM | POA: Diagnosis not present

## 2019-05-10 NOTE — Progress Notes (Signed)
Hematology and Oncology Follow Up Visit  Vincent Strickland [id number] Jan 27, 1965 54 y.o. 05/10/2019   Principle Diagnosis:  Stage IIIC [id number]) adenocarcinoma of the upper rectum  Current Therapy:   FOLFOX s/p cycle #7 - completed on 04/21/2016 Radiation with Xeloda - started on 05/10/2016 FOLFIRI - to complete all of his chemotherapy 09/2016    Interim History:  Vincent Strickland is here today for follow-up.   He is doing quite well.  As always, it is often talking to him.  He comes in wearing a  [employer] T-shirt.  He apparently went to the [employer] concert with his son I think a year or so ago.  He is feeling well.  He is working.  He is exercising.  He is trying to lose little bit of weight.  He still has the neuropathy.  I think he will always have the neuropathy.  We did go ahead and get a CT scan done.  This was done at [facility] a week ago.  The CT scan did not show any evidence of recurrent colon cancer.  His last CEA level which was just done a week ago was 1.15.  He has had a good appetite.  Again he is trying to watch what he eats to lose a little bit of weight.  He has had no change in bowel or bladder habits.  He has had no cough.  He has been vaccinated for the coronavirus.  He has had no fever.  He has had no bleeding.  There is been no headache.  Overall, his performance status is ECOG 1.    Medications:  Allergies as of 05/10/2019      Reactions   Niaspan [niacin]    Severe flushing   Other Other (See Comments)   After port placement pt had severe skin reaction in armpits and neck area "could see brush red brush marks, itched, burned and skin peeled off , severe burn"   Percocet [oxycodone-acetaminophen] Nausea Only   hallucinations      Medication List       Accurate as of May 10, 2019  8:16 AM. If you have any questions, ask your nurse or doctor.        STOP taking these medications   acetaminophen 500 MG tablet Commonly known as: TYLENOL Stopped by:  [name], MD   losartan-hydrochlorothiazide 100-25 MG tablet Commonly known as: HYZAAR Stopped by: [name], MD   vitamin B-6 250 MG tablet Stopped by: [name], MD     TAKE these medications   amLODipine 5 MG tablet Commonly known as: NORVASC Take 5 mg by mouth daily.   BENFOTIAMINE MULTI-B PO Take by mouth.   gabapentin 300 MG capsule Commonly known as: NEURONTIN TAKE 2 CAPSULES BY MOUTH EVERY DAY AT BEDTIME What changed: additional instructions   irbesartan-hydrochlorothiazide 300-12.5 MG tablet Commonly known as: AVALIDE Take 1 tablet by mouth daily.   potassium chloride SA 20 MEQ tablet Commonly known as: Klor-Con M20 Take 1 tablet (20 mEq total) by mouth 2 (two) times daily.   PROBIOTIC PO Take by mouth.       Allergies:  Allergies  Allergen Reactions  . Niaspan [Niacin]     Severe flushing  . Other Other (See Comments)    After port placement pt had severe skin reaction in armpits and neck area "could see brush red brush marks, itched, burned and skin peeled off , severe burn"  . Percocet [Oxycodone-Acetaminophen] Nausea Only  hallucinations    Past Medical History, Surgical history, Social history, and Family History were reviewed and updated.  Review of Systems: Review of Systems  Constitutional: Negative.   HENT: Negative.   Eyes: Negative.   Respiratory: Negative.   Cardiovascular: Negative.   Gastrointestinal: Negative.   Genitourinary: Negative.   Musculoskeletal: Negative.   Skin: Negative.   Neurological: Positive for tingling.  Endo/Heme/Allergies: Negative.   Psychiatric/Behavioral: Negative.      Physical Exam:  weight is 252 lb 0.6 oz (114.3 kg). His temporal temperature is 97.1 F (36.2 C) (abnormal). His blood pressure is 108/83 and his pulse is 85. His respiration is 20 and oxygen saturation is 99%.   Wt Readings from Last 3 Encounters:  05/10/19 252 lb 0.6 oz (114.3 kg)  11/09/18 251 lb (113.9 kg)    05/10/18 257 lb 1.9 oz (116.6 kg)    Physical Exam Vitals reviewed.  HENT:     Head: Normocephalic and atraumatic.  Eyes:     Pupils: Pupils are equal, round, and reactive to light.  Cardiovascular:     Rate and Rhythm: Normal rate and regular rhythm.     Heart sounds: Normal heart sounds.  Pulmonary:     Effort: Pulmonary effort is normal.     Breath sounds: Normal breath sounds.  Abdominal:     General: Bowel sounds are normal.     Palpations: Abdomen is soft.  Musculoskeletal:        General: No tenderness or deformity. Normal range of motion.     Cervical back: Normal range of motion.  Lymphadenopathy:     Cervical: No cervical adenopathy.  Skin:    General: Skin is warm and dry.     Findings: No erythema or rash.  Neurological:     Mental Status: He is alert and oriented to person, place, and time.  Psychiatric:        Behavior: Behavior normal.        Thought Content: Thought content normal.        Judgment: Judgment normal.      Lab Results  Component Value Date   WBC 5.2 04/26/2019   HGB 15.6 04/26/2019   HCT 42.8 04/26/2019   MCV 89.2 04/26/2019   PLT 242 04/26/2019   Lab Results  Component Value Date   FERRITIN 34 01/13/2016   IRON 41 (L) 01/13/2016   TIBC 369 01/13/2016   UIBC 328 01/13/2016   IRONPCTSAT 11 (L) 01/13/2016   Lab Results  Component Value Date   RBC 4.80 04/26/2019   No results found for: KPAFRELGTCHN, LAMBDASER, KAPLAMBRATIO No results found for: Kandis Cocking, IGMSERUM No results found for: Odetta Pink, SPEI   Chemistry      Component Value Date/Time   NA 144 04/26/2019 0740   NA 146 (H) 12/29/2016 0838   K 3.2 (L) 04/26/2019 0740   K 3.6 12/29/2016 0838   CL 104 04/26/2019 0740   CL 105 12/29/2016 0838   CO2 32 04/26/2019 0740   CO2 29 12/29/2016 0838   BUN 15 04/26/2019 0740   BUN 11 12/29/2016 0838   CREATININE 0.92 04/26/2019 0740   CREATININE 0.9 12/29/2016  0838      Component Value Date/Time   CALCIUM 9.9 04/26/2019 0740   CALCIUM 9.9 12/29/2016 0838   ALKPHOS 66 04/26/2019 0740   ALKPHOS 71 12/29/2016 0838   AST 19 04/26/2019 0740   ALT 32 04/26/2019 0740   ALT 46  12/29/2016 0838   BILITOT 1.0 04/26/2019 0740     Impression and Plan: Vincent Strickland is a pleasant 54 yo gentleman with 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  He had radiation and chemotherapy.  We did a "sandwich protocol".  He did quite well with this.  We completed treatment back in September 2018.  We will go ahead and do another CT scan in 6 months.  I think this is indicated given the fact that he had locally advanced colon cancer and needed aggressive therapy.  He was comes in for lab work before he has his scans done.  He has a scans done at [facility].  I spent about 30 minutes with him today.    [name], MD 4/15/20218:16 AM

## 2019-05-14 ENCOUNTER — Telehealth: Payer: Self-pay | Admitting: Hematology & Oncology

## 2019-05-14 NOTE — Telephone Encounter (Signed)
Appointments scheduled calendar pritned & mailed per 4/15 los

## 2019-10-26 ENCOUNTER — Other Ambulatory Visit: Payer: Self-pay

## 2019-10-26 ENCOUNTER — Inpatient Hospital Stay: Payer: Managed Care, Other (non HMO) | Attending: Hematology & Oncology

## 2019-10-26 ENCOUNTER — Telehealth: Payer: Self-pay | Admitting: *Deleted

## 2019-10-26 DIAGNOSIS — C772 Secondary and unspecified malignant neoplasm of intra-abdominal lymph nodes: Secondary | ICD-10-CM | POA: Diagnosis not present

## 2019-10-26 DIAGNOSIS — Z923 Personal history of irradiation: Secondary | ICD-10-CM | POA: Diagnosis not present

## 2019-10-26 DIAGNOSIS — Z9221 Personal history of antineoplastic chemotherapy: Secondary | ICD-10-CM | POA: Insufficient documentation

## 2019-10-26 DIAGNOSIS — C187 Malignant neoplasm of sigmoid colon: Secondary | ICD-10-CM | POA: Diagnosis present

## 2019-10-26 LAB — CBC WITH DIFFERENTIAL (CANCER CENTER ONLY)
Abs Immature Granulocytes: 0.02 10*3/uL (ref 0.00–0.07)
Basophils Absolute: 0.1 10*3/uL (ref 0.0–0.1)
Basophils Relative: 1 %
Eosinophils Absolute: 0.3 10*3/uL (ref 0.0–0.5)
Eosinophils Relative: 5 %
HCT: 42.5 % (ref 39.0–52.0)
Hemoglobin: 15.7 g/dL (ref 13.0–17.0)
Immature Granulocytes: 0 %
Lymphocytes Relative: 25 %
Lymphs Abs: 1.4 10*3/uL (ref 0.7–4.0)
MCH: 32.6 pg (ref 26.0–34.0)
MCHC: 36.9 g/dL — ABNORMAL HIGH (ref 30.0–36.0)
MCV: 88.2 fL (ref 80.0–100.0)
Monocytes Absolute: 0.4 10*3/uL (ref 0.1–1.0)
Monocytes Relative: 7 %
Neutro Abs: 3.6 10*3/uL (ref 1.7–7.7)
Neutrophils Relative %: 62 %
Platelet Count: 240 10*3/uL (ref 150–400)
RBC: 4.82 MIL/uL (ref 4.22–5.81)
RDW: 12.6 % (ref 11.5–15.5)
WBC Count: 5.8 10*3/uL (ref 4.0–10.5)
nRBC: 0 % (ref 0.0–0.2)

## 2019-10-26 LAB — CMP (CANCER CENTER ONLY)
ALT: 35 U/L (ref 0–44)
AST: 22 U/L (ref 15–41)
Albumin: 4.5 g/dL (ref 3.5–5.0)
Alkaline Phosphatase: 73 U/L (ref 38–126)
Anion gap: 8 (ref 5–15)
BUN: 15 mg/dL (ref 6–20)
CO2: 32 mmol/L (ref 22–32)
Calcium: 10.1 mg/dL (ref 8.9–10.3)
Chloride: 103 mmol/L (ref 98–111)
Creatinine: 0.99 mg/dL (ref 0.61–1.24)
GFR, Est AFR Am: >60 mL/min (ref >60)
GFR, Estimated: >60 mL/min (ref >60)
Glucose, Bld: 124 mg/dL — ABNORMAL HIGH (ref 70–99)
Potassium: 3.2 mmol/L — ABNORMAL LOW (ref 3.5–5.1)
Sodium: 143 mmol/L (ref 135–145)
Total Bilirubin: 0.8 mg/dL (ref 0.3–1.2)
Total Protein: 7.1 g/dL (ref 6.5–8.1)

## 2019-10-26 LAB — CEA (IN HOUSE-CHCC): CEA (CHCC-In House): 1.02 ng/mL (ref 0.00–5.00)

## 2019-10-26 LAB — LACTATE DEHYDROGENASE: LDH: 176 U/L (ref 98–192)

## 2019-10-26 NOTE — Telephone Encounter (Signed)
Unable to reach pt. LMOVM for pt with results. Pt encouraged to call with concerns.

## 2019-10-26 NOTE — Telephone Encounter (Signed)
-----   Message from [name], MD sent at 10/26/2019  1:50 PM EDT ----- Call - the CEA is perfect!!!  Laurey Arrow

## 2019-11-02 ENCOUNTER — Telehealth: Payer: Self-pay | Admitting: *Deleted

## 2019-11-02 ENCOUNTER — Other Ambulatory Visit: Payer: Self-pay | Admitting: Hematology & Oncology

## 2019-11-02 DIAGNOSIS — Z85038 Personal history of other malignant neoplasm of large intestine: Secondary | ICD-10-CM

## 2019-11-02 DIAGNOSIS — E876 Hypokalemia: Secondary | ICD-10-CM

## 2019-11-02 NOTE — Telephone Encounter (Signed)
Per Dr. Marin Olp, I called patient and gave him results of the CT chest/abdomen/pelvis. The scan looks great. He verbalized understanding.

## 2019-11-05 ENCOUNTER — Other Ambulatory Visit: Payer: Self-pay

## 2019-11-09 ENCOUNTER — Inpatient Hospital Stay (HOSPITAL_BASED_OUTPATIENT_CLINIC_OR_DEPARTMENT_OTHER): Payer: Managed Care, Other (non HMO) | Admitting: Hematology & Oncology

## 2019-11-09 ENCOUNTER — Telehealth: Payer: Self-pay | Admitting: Hematology & Oncology

## 2019-11-09 ENCOUNTER — Other Ambulatory Visit: Payer: Managed Care, Other (non HMO)

## 2019-11-09 ENCOUNTER — Other Ambulatory Visit: Payer: Self-pay

## 2019-11-09 ENCOUNTER — Encounter: Payer: Self-pay | Admitting: Hematology & Oncology

## 2019-11-09 VITALS — BP 137/88 | HR 77 | Temp 98.5°F | Resp 18 | Wt 262.0 lb

## 2019-11-09 DIAGNOSIS — C187 Malignant neoplasm of sigmoid colon: Secondary | ICD-10-CM

## 2019-11-09 DIAGNOSIS — C772 Secondary and unspecified malignant neoplasm of intra-abdominal lymph nodes: Secondary | ICD-10-CM

## 2019-11-09 NOTE — Telephone Encounter (Signed)
Appointments scheduled calendar printed per 10/15 los  CT order to be faxed to [facility]

## 2019-11-09 NOTE — Progress Notes (Signed)
Hematology and Oncology Follow Up Visit  Vincent Strickland [id number] 06/16/1965 54 y.o. 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   He is doing quite well.  He got a promotion at work.  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  He is basically a Animator.  As always, he had his CAT scan done at [facility].  This was done a week or so ago.  There was no evidence of recurrent malignancy.  His CEA level has been nice and stable.  We checked it 2 weeks ago, CEA is 1.02.  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  This mostly is in his feet.  I just wish this would get better for him.  He is on some Neurontin for this.  He may consider acupuncture.  I do not think this would be a bad idea.  He has had no problems with bowels or bladder.  He has had no bleeding.  There is been no cough or shortness of breath.  He has been very conscientious about the coronavirus.  He has had no leg swelling.  He has had no rashes.   Overall, his performance status is ECOG 1.    Medications:  Allergies as of 11/09/2019      Reactions   Niaspan [niacin]    Severe flushing   Other Other (See Comments)   After port placement pt had severe skin reaction in armpits and neck area "could see brush red brush marks, itched, burned and skin peeled off , severe burn"   Percocet [oxycodone-acetaminophen] Nausea Only   hallucinations      Medication List       Accurate as of November 09, 2019  9:20 AM. If you have any questions, ask your nurse or doctor.        amLODipine 5 MG tablet Commonly known as: NORVASC Take 5 mg by mouth daily.   BENFOTIAMINE MULTI-B PO Take by mouth.   gabapentin 300 MG  capsule Commonly known as: NEURONTIN TAKE 2 CAPSULES BY MOUTH EVERY DAY AT BEDTIME What changed: additional instructions   irbesartan-hydrochlorothiazide 300-12.5 MG tablet Commonly known as: AVALIDE Take 1 tablet by mouth daily.   Klor-Con M20 20 MEQ tablet Generic drug: potassium chloride SA TAKE 2 TABLETS BY MOUTH TWICE A DAY What changed:   how much to take  how to take this  additional instructions   PROBIOTIC PO Take by mouth.       Allergies:  Allergies  Allergen Reactions  . Niaspan [Niacin]     Severe flushing  . Other Other (See Comments)    After port placement pt had severe skin reaction in armpits and neck area "could see brush red brush marks, itched, burned and skin peeled off , severe burn"  . Percocet [Oxycodone-Acetaminophen] Nausea Only    hallucinations    Past Medical History, Surgical history, Social history, and Family History were reviewed and updated.  Review of Systems: Review of Systems  Constitutional: Negative.   HENT: Negative.   Eyes: Negative.   Respiratory: Negative.   Cardiovascular: Negative.   Gastrointestinal: Negative.   Genitourinary: Negative.   Musculoskeletal: Negative.   Skin: Negative.   Neurological:  Positive for tingling.  Endo/Heme/Allergies: Negative.   Psychiatric/Behavioral: Negative.      Physical Exam:  weight is 262 lb (118.8 kg). His oral temperature is 98.5 F (36.9 C). His blood pressure is 137/88 and his pulse is 77. His respiration is 18 and oxygen saturation is 98%.   Wt Readings from Last 3 Encounters:  11/09/19 262 lb (118.8 kg)  05/10/19 252 lb 0.6 oz (114.3 kg)  11/09/18 251 lb (113.9 kg)    Physical Exam Vitals reviewed.  HENT:     Head: Normocephalic and atraumatic.  Eyes:     Pupils: Pupils are equal, round, and reactive to light.  Cardiovascular:     Rate and Rhythm: Normal rate and regular rhythm.     Heart sounds: Normal heart sounds.  Pulmonary:     Effort: Pulmonary  effort is normal.     Breath sounds: Normal breath sounds.  Abdominal:     General: Bowel sounds are normal.     Palpations: Abdomen is soft.  Musculoskeletal:        General: No tenderness or deformity. Normal range of motion.     Cervical back: Normal range of motion.  Lymphadenopathy:     Cervical: No cervical adenopathy.  Skin:    General: Skin is warm and dry.     Findings: No erythema or rash.  Neurological:     Mental Status: He is alert and oriented to person, place, and time.  Psychiatric:        Behavior: Behavior normal.        Thought Content: Thought content normal.        Judgment: Judgment normal.      Lab Results  Component Value Date   WBC 5.8 10/26/2019   HGB 15.7 10/26/2019   HCT 42.5 10/26/2019   MCV 88.2 10/26/2019   PLT 240 10/26/2019   Lab Results  Component Value Date   FERRITIN 34 01/13/2016   IRON 41 (L) 01/13/2016   TIBC 369 01/13/2016   UIBC 328 01/13/2016   IRONPCTSAT 11 (L) 01/13/2016   Lab Results  Component Value Date   RBC 4.82 10/26/2019   No results found for: KPAFRELGTCHN, LAMBDASER, KAPLAMBRATIO No results found for: IGGSERUM, IGA, IGMSERUM No results found for: Odetta Pink, SPEI   Chemistry      Component Value Date/Time   NA 143 10/26/2019 0757   NA 146 (H) 12/29/2016 0838   K 3.2 (L) 10/26/2019 0757   K 3.6 12/29/2016 0838   CL 103 10/26/2019 0757   CL 105 12/29/2016 0838   CO2 32 10/26/2019 0757   CO2 29 12/29/2016 0838   BUN 15 10/26/2019 0757   BUN 11 12/29/2016 0838   CREATININE 0.99 10/26/2019 0757   CREATININE 0.9 12/29/2016 0838      Component Value Date/Time   CALCIUM 10.1 10/26/2019 0757   CALCIUM 9.9 12/29/2016 0838   ALKPHOS 73 10/26/2019 0757   ALKPHOS 71 12/29/2016 0838   AST 22 10/26/2019 0757   ALT 35 10/26/2019 0757   ALT 46 12/29/2016 0838   BILITOT 0.8 10/26/2019 0757     Impression and Plan: Vincent Strickland is a pleasant 54 yo gentleman  with 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  He had radiation and chemotherapy.  We did a "sandwich protocol".  He did quite well with this.  We completed treatment back in September 2018.  We will go ahead and do another CT  scan in 6 months.  I think this is indicated given the fact that he had locally advanced colon cancer and needed aggressive therapy.  He was comes in for lab work before he has his scans done.  He has a scans done at [facility].  I spent about 30 minutes with him today.    [name], MD 10/15/20219:20 AM

## 2020-05-05 ENCOUNTER — Telehealth: Payer: Self-pay

## 2020-05-05 ENCOUNTER — Inpatient Hospital Stay: Payer: 59

## 2020-05-05 NOTE — Telephone Encounter (Signed)
Pt came in for what he thought was lab and md visit.  Lab appt was today and md was 4/18.  Pt was to have a ct at [facility] but stated that he never recd a call.  Now pt is starting a new job-new ins and is not sure about coverage.  Pt was given my name and number and is to call me with start date of ins coverage so that we may r/s all his appts.   Vincent Strickland

## 2020-05-06 ENCOUNTER — Inpatient Hospital Stay: Payer: 59 | Attending: Hematology & Oncology

## 2020-05-06 ENCOUNTER — Other Ambulatory Visit: Payer: Self-pay

## 2020-05-06 DIAGNOSIS — Z9223 Personal history of estrogen therapy: Secondary | ICD-10-CM | POA: Insufficient documentation

## 2020-05-06 DIAGNOSIS — Z9221 Personal history of antineoplastic chemotherapy: Secondary | ICD-10-CM | POA: Diagnosis not present

## 2020-05-06 DIAGNOSIS — C187 Malignant neoplasm of sigmoid colon: Secondary | ICD-10-CM | POA: Diagnosis not present

## 2020-05-06 DIAGNOSIS — C20 Malignant neoplasm of rectum: Secondary | ICD-10-CM | POA: Diagnosis not present

## 2020-05-06 LAB — CBC WITH DIFFERENTIAL (CANCER CENTER ONLY)
Abs Immature Granulocytes: 0.02 10*3/uL (ref 0.00–0.07)
Basophils Absolute: 0.1 10*3/uL (ref 0.0–0.1)
Basophils Relative: 1 %
Eosinophils Absolute: 0.3 10*3/uL (ref 0.0–0.5)
Eosinophils Relative: 5 %
HCT: 43.4 % (ref 39.0–52.0)
Hemoglobin: 16.1 g/dL (ref 13.0–17.0)
Immature Granulocytes: 0 %
Lymphocytes Relative: 24 %
Lymphs Abs: 1.4 10*3/uL (ref 0.7–4.0)
MCH: 32 pg (ref 26.0–34.0)
MCHC: 37.1 g/dL — ABNORMAL HIGH (ref 30.0–36.0)
MCV: 86.3 fL (ref 80.0–100.0)
Monocytes Absolute: 0.4 10*3/uL (ref 0.1–1.0)
Monocytes Relative: 7 %
Neutro Abs: 3.6 10*3/uL (ref 1.7–7.7)
Neutrophils Relative %: 63 %
Platelet Count: 247 10*3/uL (ref 150–400)
RBC: 5.03 MIL/uL (ref 4.22–5.81)
RDW: 12.8 % (ref 11.5–15.5)
WBC Count: 5.7 10*3/uL (ref 4.0–10.5)
nRBC: 0 % (ref 0.0–0.2)

## 2020-05-06 LAB — CMP (CANCER CENTER ONLY)
ALT: 44 U/L (ref 0–44)
AST: 25 U/L (ref 15–41)
Albumin: 4.4 g/dL (ref 3.5–5.0)
Alkaline Phosphatase: 65 U/L (ref 38–126)
Anion gap: 8 (ref 5–15)
BUN: 11 mg/dL (ref 6–20)
CO2: 30 mmol/L (ref 22–32)
Calcium: 9.9 mg/dL (ref 8.9–10.3)
Chloride: 103 mmol/L (ref 98–111)
Creatinine: 0.94 mg/dL (ref 0.61–1.24)
GFR, Estimated: >60 mL/min (ref >60)
Glucose, Bld: 130 mg/dL — ABNORMAL HIGH (ref 70–99)
Potassium: 3.3 mmol/L — ABNORMAL LOW (ref 3.5–5.1)
Sodium: 141 mmol/L (ref 135–145)
Total Bilirubin: 1 mg/dL (ref 0.3–1.2)
Total Protein: 7 g/dL (ref 6.5–8.1)

## 2020-05-06 LAB — CEA (IN HOUSE-CHCC): CEA (CHCC-In House): <1 ng/mL (ref 0.00–5.00)

## 2020-05-09 ENCOUNTER — Telehealth: Payer: Self-pay | Admitting: *Deleted

## 2020-05-09 NOTE — Telephone Encounter (Signed)
Pt notified per order of Dr. Marin Olp that the CT chest/abdomen/pelvis shows "no evidence of local recurrent or metastatic disease."  Pt is appreciative of call and has no questions or concerns at this time.

## 2020-05-12 ENCOUNTER — Inpatient Hospital Stay: Payer: 59 | Admitting: Hematology & Oncology

## 2020-05-13 ENCOUNTER — Inpatient Hospital Stay: Payer: 59

## 2020-05-15 ENCOUNTER — Other Ambulatory Visit: Payer: Self-pay

## 2020-05-15 ENCOUNTER — Inpatient Hospital Stay (HOSPITAL_BASED_OUTPATIENT_CLINIC_OR_DEPARTMENT_OTHER): Payer: 59 | Admitting: Hematology & Oncology

## 2020-05-15 ENCOUNTER — Encounter: Payer: Self-pay | Admitting: Hematology & Oncology

## 2020-05-15 VITALS — BP 133/91 | HR 98 | Temp 98.6°F | Resp 18 | Ht 72.84 in | Wt 264.0 lb

## 2020-05-15 DIAGNOSIS — C20 Malignant neoplasm of rectum: Secondary | ICD-10-CM | POA: Diagnosis not present

## 2020-05-15 DIAGNOSIS — C772 Secondary and unspecified malignant neoplasm of intra-abdominal lymph nodes: Secondary | ICD-10-CM

## 2020-05-15 DIAGNOSIS — C187 Malignant neoplasm of sigmoid colon: Secondary | ICD-10-CM | POA: Diagnosis not present

## 2020-05-15 NOTE — Progress Notes (Signed)
Hematology and Oncology Follow Up Visit  Vincent Strickland [id number] Dec 06, 1965 54 y.o. 05/15/2020   Principle Diagnosis:  Stage IIIC ([id number]) adenocarcinoma of the upper rectum  Current Therapy:   FOLFOX s/p cycle #7 - completed on 04/21/2016 Radiation with Xeloda - started on 05/10/2016 FOLFIRI - to complete all of his chemotherapy 09/2016    Interim History:  Mr. Vincent Strickland is here today for follow-up.   As always, he looks quite good.  He has a new job.  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  He will be going to [place] in a week or so for a conference.  I told to make sure he has some good steak when he is down there.  He did have his CAT scan done.  This is done at [facility].  He has done a couple weeks ago.  There is no evidence of recurrent malignancy.  His last CEA level was less than 1.  He still has the neuropathy.  This is been chronic.  He is on Neurontin.  He has had no cough or shortness of breath.  He has had no rashes.  There has been no leg swelling.  He has had no fever.  There has been no bleeding.  Overall, his performance status is ECOG 1.    Medications:  Allergies as of 05/15/2020      Reactions   Niaspan [niacin] Other (See Comments)   Severe flushing   Other Itching, Other (See Comments)   After port placement pt had severe skin reaction in armpits and neck area "could see brush red brush marks, itched, burned and skin peeled off , severe burn"   Percocet [oxycodone-acetaminophen] Nausea Only, Other (See Comments)   hallucinations      Medication List       Accurate as of May 15, 2020  4:08 PM. If you have any questions, ask your nurse or doctor.        amLODipine 5 MG tablet Commonly known as: NORVASC Take 5 mg by mouth daily.   BENFOTIAMINE MULTI-B PO Take by mouth.   ezetimibe 10 MG tablet Commonly known as: ZETIA Take 10 mg by mouth daily.   gabapentin 300 MG capsule Commonly known as: NEURONTIN TAKE 2 CAPSULES BY MOUTH  EVERY DAY AT BEDTIME What changed: additional instructions   irbesartan-hydrochlorothiazide 300-12.5 MG tablet Commonly known as: AVALIDE Take 1 tablet by mouth daily.   Klor-Con M20 20 MEQ tablet Generic drug: potassium chloride SA TAKE 2 TABLETS BY MOUTH TWICE A DAY What changed:   how much to take  how to take this  additional instructions   PROBIOTIC PO Take by mouth.       Allergies:  Allergies  Allergen Reactions  . Niaspan [Niacin] Other (See Comments)    Severe flushing  . Other Itching and Other (See Comments)    After port placement pt had severe skin reaction in armpits and neck area "could see brush red brush marks, itched, burned and skin peeled off , severe burn"  . Percocet [Oxycodone-Acetaminophen] Nausea Only and Other (See Comments)    hallucinations    Past Medical History, Surgical history, Social history, and Family History were reviewed and updated.  Review of Systems: Review of Systems  Constitutional: Negative.   HENT: Negative.   Eyes: Negative.   Respiratory: Negative.   Cardiovascular: Negative.   Gastrointestinal: Negative.   Genitourinary: Negative.   Musculoskeletal: Negative.   Skin: Negative.   Neurological: Positive for  tingling.  Endo/Heme/Allergies: Negative.   Psychiatric/Behavioral: Negative.      Physical Exam:  height is 6' 0.84" (1.85 m) and weight is 264 lb (119.7 kg). His oral temperature is 98.6 F (37 C). His blood pressure is 133/91 (abnormal) and his pulse is 98. His respiration is 18 and oxygen saturation is 100%.   Wt Readings from Last 3 Encounters:  05/15/20 264 lb (119.7 kg)  11/09/19 262 lb (118.8 kg)  05/10/19 252 lb 0.6 oz (114.3 kg)    Physical Exam Vitals reviewed.  HENT:     Head: Normocephalic and atraumatic.  Eyes:     Pupils: Pupils are equal, round, and reactive to light.  Cardiovascular:     Rate and Rhythm: Normal rate and regular rhythm.     Heart sounds: Normal heart sounds.   Pulmonary:     Effort: Pulmonary effort is normal.     Breath sounds: Normal breath sounds.  Abdominal:     General: Bowel sounds are normal.     Palpations: Abdomen is soft.  Musculoskeletal:        General: No tenderness or deformity. Normal range of motion.     Cervical back: Normal range of motion.  Lymphadenopathy:     Cervical: No cervical adenopathy.  Skin:    General: Skin is warm and dry.     Findings: No erythema or rash.  Neurological:     Mental Status: He is alert and oriented to person, place, and time.  Psychiatric:        Behavior: Behavior normal.        Thought Content: Thought content normal.        Judgment: Judgment normal.      Lab Results  Component Value Date   WBC 5.7 05/06/2020   HGB 16.1 05/06/2020   HCT 43.4 05/06/2020   MCV 86.3 05/06/2020   PLT 247 05/06/2020   Lab Results  Component Value Date   FERRITIN 34 01/13/2016   IRON 41 (L) 01/13/2016   TIBC 369 01/13/2016   UIBC 328 01/13/2016   IRONPCTSAT 11 (L) 01/13/2016   Lab Results  Component Value Date   RBC 5.03 05/06/2020   No results found for: KPAFRELGTCHN, LAMBDASER, KAPLAMBRATIO No results found for: Kandis Cocking, IGMSERUM No results found for: Odetta Pink, SPEI   Chemistry      Component Value Date/Time   NA 141 05/06/2020 0822   NA 146 (H) 12/29/2016 0838   K 3.3 (L) 05/06/2020 0822   K 3.6 12/29/2016 0838   CL 103 05/06/2020 0822   CL 105 12/29/2016 0838   CO2 30 05/06/2020 0822   CO2 29 12/29/2016 0838   BUN 11 05/06/2020 0822   BUN 11 12/29/2016 0838   CREATININE 0.94 05/06/2020 0822   CREATININE 0.9 12/29/2016 0838      Component Value Date/Time   CALCIUM 9.9 05/06/2020 0822   CALCIUM 9.9 12/29/2016 0838   ALKPHOS 65 05/06/2020 0822   ALKPHOS 71 12/29/2016 0838   AST 25 05/06/2020 0822   ALT 44 05/06/2020 0822   ALT 46 12/29/2016 0838   BILITOT 1.0 05/06/2020 0822     Impression and Plan: Mr.  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  He had radiation and chemotherapy.  We did a "sandwich protocol".  He did quite well with this.  We completed treatment back in September 2018.  We will  go ahead and do another CT scan in 6 months.  I think this is indicated given the fact that he had locally advanced colon cancer and needed aggressive therapy.  After this scan, I think we can probably hold off on doing scans.  I just feel confident that his malignancy is not going to recur.  He really got aggressive therapy.  I am just happy that he is doing well.  He enjoys his new job.  It seems like his family is also doing well.    [name], MD 4/21/20224:08 PM

## 2020-05-19 ENCOUNTER — Telehealth: Payer: Self-pay

## 2020-05-19 NOTE — Telephone Encounter (Signed)
Called pt with appts per 05/15/20 los and pt is awrae that we will call back to make ct scan appt at [facility] pt [facility] as we did last visit and he will need labs a few days prior   Antionette Luster

## 2020-08-18 DIAGNOSIS — G629 Polyneuropathy, unspecified: Secondary | ICD-10-CM | POA: Diagnosis not present

## 2020-08-18 DIAGNOSIS — I10 Essential (primary) hypertension: Secondary | ICD-10-CM | POA: Diagnosis not present

## 2020-08-18 DIAGNOSIS — R6882 Decreased libido: Secondary | ICD-10-CM | POA: Diagnosis not present

## 2020-08-18 DIAGNOSIS — R739 Hyperglycemia, unspecified: Secondary | ICD-10-CM | POA: Diagnosis not present

## 2020-08-29 DIAGNOSIS — H35371 Puckering of macula, right eye: Secondary | ICD-10-CM | POA: Diagnosis not present

## 2020-09-19 DIAGNOSIS — H43823 Vitreomacular adhesion, bilateral: Secondary | ICD-10-CM | POA: Diagnosis not present

## 2020-09-19 DIAGNOSIS — H35033 Hypertensive retinopathy, bilateral: Secondary | ICD-10-CM | POA: Diagnosis not present

## 2020-09-19 DIAGNOSIS — H2513 Age-related nuclear cataract, bilateral: Secondary | ICD-10-CM | POA: Diagnosis not present

## 2020-09-19 DIAGNOSIS — H35711 Central serous chorioretinopathy, right eye: Secondary | ICD-10-CM | POA: Diagnosis not present

## 2020-09-23 ENCOUNTER — Other Ambulatory Visit: Payer: Self-pay | Admitting: Hematology & Oncology

## 2020-09-23 DIAGNOSIS — E876 Hypokalemia: Secondary | ICD-10-CM

## 2020-09-23 DIAGNOSIS — Z85038 Personal history of other malignant neoplasm of large intestine: Secondary | ICD-10-CM

## 2020-09-25 DIAGNOSIS — U071 COVID-19: Secondary | ICD-10-CM | POA: Diagnosis not present

## 2020-09-25 DIAGNOSIS — Z20822 Contact with and (suspected) exposure to covid-19: Secondary | ICD-10-CM | POA: Diagnosis not present

## 2020-10-17 DIAGNOSIS — H2513 Age-related nuclear cataract, bilateral: Secondary | ICD-10-CM | POA: Diagnosis not present

## 2020-10-17 DIAGNOSIS — H43823 Vitreomacular adhesion, bilateral: Secondary | ICD-10-CM | POA: Diagnosis not present

## 2020-10-17 DIAGNOSIS — H35711 Central serous chorioretinopathy, right eye: Secondary | ICD-10-CM | POA: Diagnosis not present

## 2020-10-17 DIAGNOSIS — H35033 Hypertensive retinopathy, bilateral: Secondary | ICD-10-CM | POA: Diagnosis not present

## 2020-11-13 DIAGNOSIS — C187 Malignant neoplasm of sigmoid colon: Secondary | ICD-10-CM | POA: Diagnosis not present

## 2020-11-13 DIAGNOSIS — C772 Secondary and unspecified malignant neoplasm of intra-abdominal lymph nodes: Secondary | ICD-10-CM | POA: Diagnosis not present

## 2020-11-14 ENCOUNTER — Telehealth: Payer: Self-pay | Admitting: *Deleted

## 2020-11-14 ENCOUNTER — Other Ambulatory Visit: Payer: Self-pay

## 2020-11-14 ENCOUNTER — Inpatient Hospital Stay: Payer: BC Managed Care – PPO | Attending: Hematology & Oncology

## 2020-11-14 ENCOUNTER — Ambulatory Visit: Payer: Managed Care, Other (non HMO) | Admitting: Hematology & Oncology

## 2020-11-14 DIAGNOSIS — C187 Malignant neoplasm of sigmoid colon: Secondary | ICD-10-CM

## 2020-11-14 DIAGNOSIS — Z85048 Personal history of other malignant neoplasm of rectum, rectosigmoid junction, and anus: Secondary | ICD-10-CM | POA: Insufficient documentation

## 2020-11-14 DIAGNOSIS — Z923 Personal history of irradiation: Secondary | ICD-10-CM | POA: Diagnosis not present

## 2020-11-14 DIAGNOSIS — Z9221 Personal history of antineoplastic chemotherapy: Secondary | ICD-10-CM | POA: Insufficient documentation

## 2020-11-14 DIAGNOSIS — C772 Secondary and unspecified malignant neoplasm of intra-abdominal lymph nodes: Secondary | ICD-10-CM

## 2020-11-14 LAB — CBC WITH DIFFERENTIAL (CANCER CENTER ONLY)
Abs Immature Granulocytes: 0.02 10*3/uL (ref 0.00–0.07)
Basophils Absolute: 0.1 10*3/uL (ref 0.0–0.1)
Basophils Relative: 1 %
Eosinophils Absolute: 0.2 10*3/uL (ref 0.0–0.5)
Eosinophils Relative: 3 %
HCT: 44.2 % (ref 39.0–52.0)
Hemoglobin: 16.2 g/dL (ref 13.0–17.0)
Immature Granulocytes: 0 %
Lymphocytes Relative: 23 %
Lymphs Abs: 1.3 10*3/uL (ref 0.7–4.0)
MCH: 32.1 pg (ref 26.0–34.0)
MCHC: 36.7 g/dL — ABNORMAL HIGH (ref 30.0–36.0)
MCV: 87.7 fL (ref 80.0–100.0)
Monocytes Absolute: 0.4 10*3/uL (ref 0.1–1.0)
Monocytes Relative: 8 %
Neutro Abs: 3.6 10*3/uL (ref 1.7–7.7)
Neutrophils Relative %: 65 %
Platelet Count: 256 10*3/uL (ref 150–400)
RBC: 5.04 MIL/uL (ref 4.22–5.81)
RDW: 13 % (ref 11.5–15.5)
WBC Count: 5.5 10*3/uL (ref 4.0–10.5)
nRBC: 0 % (ref 0.0–0.2)

## 2020-11-14 LAB — CMP (CANCER CENTER ONLY)
ALT: 44 U/L (ref 0–44)
AST: 25 U/L (ref 15–41)
Albumin: 4.4 g/dL (ref 3.5–5.0)
Alkaline Phosphatase: 77 U/L (ref 38–126)
Anion gap: 6 (ref 5–15)
BUN: 11 mg/dL (ref 6–20)
CO2: 36 mmol/L — ABNORMAL HIGH (ref 22–32)
Calcium: 10 mg/dL (ref 8.9–10.3)
Chloride: 99 mmol/L (ref 98–111)
Creatinine: 1.03 mg/dL (ref 0.61–1.24)
GFR, Estimated: >60 mL/min (ref >60)
Glucose, Bld: 220 mg/dL — ABNORMAL HIGH (ref 70–99)
Potassium: 3.5 mmol/L (ref 3.5–5.1)
Sodium: 141 mmol/L (ref 135–145)
Total Bilirubin: 1 mg/dL (ref 0.3–1.2)
Total Protein: 7.2 g/dL (ref 6.5–8.1)

## 2020-11-14 LAB — CEA (IN HOUSE-CHCC): CEA (CHCC-In House): 1 ng/mL (ref 0.00–5.00)

## 2020-11-14 NOTE — Telephone Encounter (Signed)
Pt notified per order of Dr. Marin Olp that the CT CAP shows "no evidence of recurrent or metastatic disease."  Pt is appreciative of call and has no questions or concerns at this time.

## 2020-11-21 DIAGNOSIS — R739 Hyperglycemia, unspecified: Secondary | ICD-10-CM | POA: Diagnosis not present

## 2020-11-21 DIAGNOSIS — Z23 Encounter for immunization: Secondary | ICD-10-CM | POA: Diagnosis not present

## 2020-11-21 DIAGNOSIS — G629 Polyneuropathy, unspecified: Secondary | ICD-10-CM | POA: Diagnosis not present

## 2020-11-21 DIAGNOSIS — I10 Essential (primary) hypertension: Secondary | ICD-10-CM | POA: Diagnosis not present

## 2020-11-21 DIAGNOSIS — E669 Obesity, unspecified: Secondary | ICD-10-CM | POA: Diagnosis not present

## 2020-11-27 ENCOUNTER — Inpatient Hospital Stay: Payer: BC Managed Care – PPO | Attending: Hematology & Oncology | Admitting: Hematology & Oncology

## 2020-11-27 ENCOUNTER — Other Ambulatory Visit: Payer: Self-pay

## 2020-11-27 ENCOUNTER — Encounter: Payer: Self-pay | Admitting: Hematology & Oncology

## 2020-11-27 VITALS — BP 130/90 | HR 65 | Temp 98.8°F | Resp 18 | Ht 72.0 in

## 2020-11-27 DIAGNOSIS — C772 Secondary and unspecified malignant neoplasm of intra-abdominal lymph nodes: Secondary | ICD-10-CM

## 2020-11-27 DIAGNOSIS — Z9221 Personal history of antineoplastic chemotherapy: Secondary | ICD-10-CM | POA: Diagnosis not present

## 2020-11-27 DIAGNOSIS — Z923 Personal history of irradiation: Secondary | ICD-10-CM | POA: Insufficient documentation

## 2020-11-27 DIAGNOSIS — C187 Malignant neoplasm of sigmoid colon: Secondary | ICD-10-CM

## 2020-11-27 DIAGNOSIS — Z85048 Personal history of other malignant neoplasm of rectum, rectosigmoid junction, and anus: Secondary | ICD-10-CM | POA: Diagnosis not present

## 2020-11-27 NOTE — Progress Notes (Signed)
Hematology and Oncology Follow Up Visit  Vincent Strickland [id number] 02/20/1965 55 y.o. 11/27/2020   Principle Diagnosis:  Stage IIIC ([id number]) adenocarcinoma of the upper rectum  Current Therapy:   FOLFOX s/p cycle #7 - completed on 04/21/2016 Radiation with Xeloda - started on 05/10/2016 FOLFIRI - to complete all of his chemotherapy 09/2016    Interim History:  Vincent Strickland is here today for follow-up.   We see him every 6 months.  He is doing quite well.  He just got back from [place].  He was down there for a convention.  He is certainly enjoying himself.  He did have a CT scan done.  This was done at [facility].  This was done on October 20.  Thankfully, the CT scan did not show any evidence of recurrent kidney cancer.  There is a slightly increased size of a lesion in the right kidney which was felt to be a cyst.  We will continue to follow this.  He had lab work done recently.  His CEA was 1.0.  He has had no problems with nausea or vomiting.  Has had no cough or shortness of breath.  He is mostly bothered by the neuropathy.  He is on Neurontin for this.  He has had no issues with fever.  He has had no problems with COVID.  Overall, his performance status is ECOG 0.     Medications:  Allergies as of 11/27/2020       Reactions   Niaspan [niacin] Other (See Comments)   Severe flushing   Other Itching, Other (See Comments)   After port placement pt had severe skin reaction in armpits and neck area "could see brush red brush marks, itched, burned and skin peeled off , severe burn"   Percocet [oxycodone-acetaminophen] Nausea Only, Other (See Comments)   hallucinations        Medication List        Accurate as of November 27, 2020  1:30 PM. If you have any questions, ask your nurse or doctor.          amLODipine 5 MG tablet Commonly known as: NORVASC Take 5 mg by mouth daily.   BENFOTIAMINE MULTI-B PO Take by mouth.   ezetimibe 10 MG tablet Commonly known as:  ZETIA Take 10 mg by mouth daily.   gabapentin 300 MG capsule Commonly known as: NEURONTIN TAKE 2 CAPSULES BY MOUTH EVERY DAY AT BEDTIME What changed: additional instructions   irbesartan-hydrochlorothiazide 300-12.5 MG tablet Commonly known as: AVALIDE Take 1 tablet by mouth daily.   Klor-Con M20 20 MEQ tablet Generic drug: potassium chloride SA TAKE 2 TABLETS BY MOUTH TWICE A DAY   PROBIOTIC PO Take by mouth.        Allergies:  Allergies  Allergen Reactions   Niaspan [Niacin] Other (See Comments)    Severe flushing   Other Itching and Other (See Comments)    After port placement pt had severe skin reaction in armpits and neck area "could see brush red brush marks, itched, burned and skin peeled off , severe burn"   Percocet [Oxycodone-Acetaminophen] Nausea Only and Other (See Comments)    hallucinations    Past Medical History, Surgical history, Social history, and Family History were reviewed and updated.  Review of Systems: Review of Systems  Constitutional: Negative.   HENT: Negative.    Eyes: Negative.   Respiratory: Negative.    Cardiovascular: Negative.   Gastrointestinal: Negative.   Genitourinary: Negative.   Musculoskeletal: Negative.  Skin: Negative.   Neurological:  Positive for tingling.  Endo/Heme/Allergies: Negative.   Psychiatric/Behavioral: Negative.      Physical Exam:  height is 6' (1.829 m). His oral temperature is 98.8 F (37.1 C). His blood pressure is 130/90 and his pulse is 65. His respiration is 18 and oxygen saturation is 100%.   Wt Readings from Last 3 Encounters:  05/15/20 264 lb (119.7 kg)  11/09/19 262 lb (118.8 kg)  05/10/19 252 lb 0.6 oz (114.3 kg)    Physical Exam Vitals reviewed.  HENT:     Head: Normocephalic and atraumatic.  Eyes:     Pupils: Pupils are equal, round, and reactive to light.  Cardiovascular:     Rate and Rhythm: Normal rate and regular rhythm.     Heart sounds: Normal heart sounds.  Pulmonary:      Effort: Pulmonary effort is normal.     Breath sounds: Normal breath sounds.  Abdominal:     General: Bowel sounds are normal.     Palpations: Abdomen is soft.  Musculoskeletal:        General: No tenderness or deformity. Normal range of motion.     Cervical back: Normal range of motion.  Lymphadenopathy:     Cervical: No cervical adenopathy.  Skin:    General: Skin is warm and dry.     Findings: No erythema or rash.  Neurological:     Mental Status: He is alert and oriented to person, place, and time.  Psychiatric:        Behavior: Behavior normal.        Thought Content: Thought content normal.        Judgment: Judgment normal.     Lab Results  Component Value Date   WBC 5.5 11/14/2020   HGB 16.2 11/14/2020   HCT 44.2 11/14/2020   MCV 87.7 11/14/2020   PLT 256 11/14/2020   Lab Results  Component Value Date   FERRITIN 34 01/13/2016   IRON 41 (L) 01/13/2016   TIBC 369 01/13/2016   UIBC 328 01/13/2016   IRONPCTSAT 11 (L) 01/13/2016   Lab Results  Component Value Date   RBC 5.04 11/14/2020   No results found for: KPAFRELGTCHN, LAMBDASER, KAPLAMBRATIO No results found for: IGGSERUM, IGA, IGMSERUM No results found for: Odetta Pink, SPEI   Chemistry      Component Value Date/Time   NA 141 11/14/2020 0757   NA 146 (H) 12/29/2016 0838   K 3.5 11/14/2020 0757   K 3.6 12/29/2016 0838   CL 99 11/14/2020 0757   CL 105 12/29/2016 0838   CO2 36 (H) 11/14/2020 0757   CO2 29 12/29/2016 0838   BUN 11 11/14/2020 0757   BUN 11 12/29/2016 0838   CREATININE 1.03 11/14/2020 0757   CREATININE 0.9 12/29/2016 0838      Component Value Date/Time   CALCIUM 10.0 11/14/2020 0757   CALCIUM 9.9 12/29/2016 0838   ALKPHOS 77 11/14/2020 0757   ALKPHOS 71 12/29/2016 0838   AST 25 11/14/2020 0757   ALT 44 11/14/2020 0757   ALT 46 12/29/2016 0838   BILITOT 1.0 11/14/2020 0757     Impression and Plan: Vincent Strickland is a  pleasant 55 yo gentleman with 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  He had radiation and chemotherapy.  We did a "sandwich protocol".  He did quite well with this.  We completed treatment back in September 2018.  I think we will  do his CT scans 1 more year.  Will be due in 5 years that he would had the end of his treatment.  I think this would be reasonable.  I am just happy that his quality life is doing well.  I just wish that his neuropathy would get better.  I think this is can be chronic.  He has been following fairly closely by his family doctor.  I am just very happy that everything is going well for him.  His blood sugar was on the high side but that hopefully is a "outlier."     [name], MD 11/3/20221:30 PM

## 2020-12-05 DIAGNOSIS — Z8639 Personal history of other endocrine, nutritional and metabolic disease: Secondary | ICD-10-CM | POA: Diagnosis not present

## 2020-12-05 DIAGNOSIS — R5382 Chronic fatigue, unspecified: Secondary | ICD-10-CM | POA: Diagnosis not present

## 2020-12-05 DIAGNOSIS — R739 Hyperglycemia, unspecified: Secondary | ICD-10-CM | POA: Diagnosis not present

## 2020-12-05 DIAGNOSIS — R6882 Decreased libido: Secondary | ICD-10-CM | POA: Diagnosis not present

## 2020-12-08 DIAGNOSIS — H43823 Vitreomacular adhesion, bilateral: Secondary | ICD-10-CM | POA: Diagnosis not present

## 2020-12-08 DIAGNOSIS — H35033 Hypertensive retinopathy, bilateral: Secondary | ICD-10-CM | POA: Diagnosis not present

## 2020-12-08 DIAGNOSIS — H35711 Central serous chorioretinopathy, right eye: Secondary | ICD-10-CM | POA: Diagnosis not present

## 2020-12-08 DIAGNOSIS — H2513 Age-related nuclear cataract, bilateral: Secondary | ICD-10-CM | POA: Diagnosis not present

## 2021-01-12 DIAGNOSIS — H35711 Central serous chorioretinopathy, right eye: Secondary | ICD-10-CM | POA: Diagnosis not present

## 2021-01-12 DIAGNOSIS — H43823 Vitreomacular adhesion, bilateral: Secondary | ICD-10-CM | POA: Diagnosis not present

## 2021-01-25 DIAGNOSIS — Z85028 Personal history of other malignant neoplasm of stomach: Secondary | ICD-10-CM

## 2021-01-25 HISTORY — DX: Personal history of other malignant neoplasm of stomach: Z85.028

## 2021-01-25 HISTORY — PX: UPPER GASTROINTESTINAL ENDOSCOPY: SHX188

## 2021-02-10 DIAGNOSIS — H35033 Hypertensive retinopathy, bilateral: Secondary | ICD-10-CM | POA: Diagnosis not present

## 2021-02-10 DIAGNOSIS — H35711 Central serous chorioretinopathy, right eye: Secondary | ICD-10-CM | POA: Diagnosis not present

## 2021-02-10 DIAGNOSIS — H43823 Vitreomacular adhesion, bilateral: Secondary | ICD-10-CM | POA: Diagnosis not present

## 2021-02-10 DIAGNOSIS — H2513 Age-related nuclear cataract, bilateral: Secondary | ICD-10-CM | POA: Diagnosis not present

## 2021-02-17 DIAGNOSIS — E876 Hypokalemia: Secondary | ICD-10-CM | POA: Diagnosis not present

## 2021-02-23 DIAGNOSIS — R7303 Prediabetes: Secondary | ICD-10-CM | POA: Diagnosis not present

## 2021-02-23 DIAGNOSIS — E538 Deficiency of other specified B group vitamins: Secondary | ICD-10-CM | POA: Diagnosis not present

## 2021-02-23 DIAGNOSIS — E876 Hypokalemia: Secondary | ICD-10-CM | POA: Diagnosis not present

## 2021-02-23 DIAGNOSIS — R6882 Decreased libido: Secondary | ICD-10-CM | POA: Diagnosis not present

## 2021-02-23 DIAGNOSIS — Z8639 Personal history of other endocrine, nutritional and metabolic disease: Secondary | ICD-10-CM | POA: Diagnosis not present

## 2021-02-23 DIAGNOSIS — E559 Vitamin D deficiency, unspecified: Secondary | ICD-10-CM | POA: Diagnosis not present

## 2021-02-23 DIAGNOSIS — Z23 Encounter for immunization: Secondary | ICD-10-CM | POA: Diagnosis not present

## 2021-03-05 DIAGNOSIS — E876 Hypokalemia: Secondary | ICD-10-CM | POA: Diagnosis not present

## 2021-05-11 ENCOUNTER — Telehealth: Payer: Self-pay | Admitting: *Deleted

## 2021-05-11 NOTE — Telephone Encounter (Signed)
Erroneous encounter

## 2021-05-13 ENCOUNTER — Inpatient Hospital Stay: Payer: BC Managed Care – PPO | Attending: Hematology & Oncology

## 2021-05-13 DIAGNOSIS — C772 Secondary and unspecified malignant neoplasm of intra-abdominal lymph nodes: Secondary | ICD-10-CM

## 2021-05-13 DIAGNOSIS — Z85038 Personal history of other malignant neoplasm of large intestine: Secondary | ICD-10-CM | POA: Insufficient documentation

## 2021-05-13 LAB — CBC WITH DIFFERENTIAL (CANCER CENTER ONLY)
Abs Immature Granulocytes: 0.01 10*3/uL (ref 0.00–0.07)
Basophils Absolute: 0 10*3/uL (ref 0.0–0.1)
Basophils Relative: 1 %
Eosinophils Absolute: 0.1 10*3/uL (ref 0.0–0.5)
Eosinophils Relative: 3 %
HCT: 43.2 % (ref 39.0–52.0)
Hemoglobin: 15.8 g/dL (ref 13.0–17.0)
Immature Granulocytes: 0 %
Lymphocytes Relative: 28 %
Lymphs Abs: 1.2 10*3/uL (ref 0.7–4.0)
MCH: 31.9 pg (ref 26.0–34.0)
MCHC: 36.6 g/dL — ABNORMAL HIGH (ref 30.0–36.0)
MCV: 87.3 fL (ref 80.0–100.0)
Monocytes Absolute: 0.4 10*3/uL (ref 0.1–1.0)
Monocytes Relative: 9 %
Neutro Abs: 2.7 10*3/uL (ref 1.7–7.7)
Neutrophils Relative %: 59 %
Platelet Count: 215 10*3/uL (ref 150–400)
RBC: 4.95 MIL/uL (ref 4.22–5.81)
RDW: 12.9 % (ref 11.5–15.5)
WBC Count: 4.5 10*3/uL (ref 4.0–10.5)
nRBC: 0 % (ref 0.0–0.2)

## 2021-05-13 LAB — CMP (CANCER CENTER ONLY)
ALT: 36 U/L (ref 0–44)
AST: 22 U/L (ref 15–41)
Albumin: 4.4 g/dL (ref 3.5–5.0)
Alkaline Phosphatase: 62 U/L (ref 38–126)
Anion gap: 8 (ref 5–15)
BUN: 18 mg/dL (ref 6–20)
CO2: 30 mmol/L (ref 22–32)
Calcium: 9.7 mg/dL (ref 8.9–10.3)
Chloride: 104 mmol/L (ref 98–111)
Creatinine: 1.06 mg/dL (ref 0.61–1.24)
GFR, Estimated: >60 mL/min (ref >60)
Glucose, Bld: 115 mg/dL — ABNORMAL HIGH (ref 70–99)
Potassium: 3.1 mmol/L — ABNORMAL LOW (ref 3.5–5.1)
Sodium: 142 mmol/L (ref 135–145)
Total Bilirubin: 1.1 mg/dL (ref 0.3–1.2)
Total Protein: 7.2 g/dL (ref 6.5–8.1)

## 2021-05-13 LAB — CEA (IN HOUSE-CHCC): CEA (CHCC-In House): 1.32 ng/mL (ref 0.00–5.00)

## 2021-05-22 DIAGNOSIS — H35033 Hypertensive retinopathy, bilateral: Secondary | ICD-10-CM | POA: Diagnosis not present

## 2021-05-22 DIAGNOSIS — H35711 Central serous chorioretinopathy, right eye: Secondary | ICD-10-CM | POA: Diagnosis not present

## 2021-05-22 DIAGNOSIS — C187 Malignant neoplasm of sigmoid colon: Secondary | ICD-10-CM | POA: Diagnosis not present

## 2021-05-22 DIAGNOSIS — C772 Secondary and unspecified malignant neoplasm of intra-abdominal lymph nodes: Secondary | ICD-10-CM | POA: Diagnosis not present

## 2021-05-22 DIAGNOSIS — H43823 Vitreomacular adhesion, bilateral: Secondary | ICD-10-CM | POA: Diagnosis not present

## 2021-05-22 DIAGNOSIS — H2513 Age-related nuclear cataract, bilateral: Secondary | ICD-10-CM | POA: Diagnosis not present

## 2021-05-26 DIAGNOSIS — R5382 Chronic fatigue, unspecified: Secondary | ICD-10-CM | POA: Diagnosis not present

## 2021-05-26 DIAGNOSIS — R6882 Decreased libido: Secondary | ICD-10-CM | POA: Diagnosis not present

## 2021-05-26 DIAGNOSIS — Z8639 Personal history of other endocrine, nutritional and metabolic disease: Secondary | ICD-10-CM | POA: Diagnosis not present

## 2021-05-26 DIAGNOSIS — E559 Vitamin D deficiency, unspecified: Secondary | ICD-10-CM | POA: Diagnosis not present

## 2021-05-26 DIAGNOSIS — R7303 Prediabetes: Secondary | ICD-10-CM | POA: Diagnosis not present

## 2021-05-26 DIAGNOSIS — E538 Deficiency of other specified B group vitamins: Secondary | ICD-10-CM | POA: Diagnosis not present

## 2021-05-27 ENCOUNTER — Inpatient Hospital Stay: Payer: BC Managed Care – PPO | Attending: Hematology & Oncology | Admitting: Hematology & Oncology

## 2021-05-27 ENCOUNTER — Other Ambulatory Visit: Payer: Self-pay

## 2021-05-27 ENCOUNTER — Other Ambulatory Visit: Payer: Self-pay | Admitting: *Deleted

## 2021-05-27 ENCOUNTER — Encounter: Payer: Self-pay | Admitting: Hematology & Oncology

## 2021-05-27 VITALS — BP 129/98 | HR 63 | Temp 98.0°F | Resp 18 | Ht 72.0 in | Wt 251.0 lb

## 2021-05-27 DIAGNOSIS — G629 Polyneuropathy, unspecified: Secondary | ICD-10-CM | POA: Diagnosis not present

## 2021-05-27 DIAGNOSIS — N2889 Other specified disorders of kidney and ureter: Secondary | ICD-10-CM

## 2021-05-27 DIAGNOSIS — C187 Malignant neoplasm of sigmoid colon: Secondary | ICD-10-CM

## 2021-05-27 DIAGNOSIS — C772 Secondary and unspecified malignant neoplasm of intra-abdominal lymph nodes: Secondary | ICD-10-CM

## 2021-05-27 DIAGNOSIS — Z85038 Personal history of other malignant neoplasm of large intestine: Secondary | ICD-10-CM | POA: Insufficient documentation

## 2021-05-27 DIAGNOSIS — E876 Hypokalemia: Secondary | ICD-10-CM

## 2021-05-27 MED ORDER — GABAPENTIN 300 MG PO CAPS: ORAL_CAPSULE | ORAL | 4 refills | Status: DC

## 2021-05-27 NOTE — Progress Notes (Signed)
?Hematology and Oncology Follow Up Visit ? ?Ladell Heads ?[id number] ?04-Jul-1965 56 y.o. ?05/27/2021 ? ? 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 ?Current Therapy:   ?FOLFOX s/p cycle #7 - completed on 04/21/2016 ?Radiation with Xeloda - started on 05/10/2016 ?FOLFIRI - to complete all of his chemotherapy 09/2016 ?   ?Interim History:  [name] is here today for follow-up.  We last saw him 6 months ago.  Since then, he has been doing okay.  He really has had no problems with respect to nausea or vomiting.  There is been no change in bowel or bladder habits.  He still has the neuropathy.  He is still working. ? ?Had the CT scan done at [facility] a week ago.  The CT scan did not show any evidence of recurrence of the colon cancer.  However, there was a 2 cm mass in the right kidney.  I know he has had this before.  However, we will going to have to get another MRI to better assess this. ? ?His last CEA level I think was 1.84. ? ?He has had no cough.  He does not have  shortness of breath.  He has had no headache.  He has had no rashes. ? ?Overall, I would say his performance status is ECOG 0.   ? ?Medications:  ?Allergies as of 05/27/2021   ? ?   Reactions  ? Niaspan [niacin] Other (See Comments)  ? Severe flushing  ? Other Itching, Other (See Comments)  ? After port placement pt had severe skin reaction in armpits and neck area "could see brush red brush marks, itched, burned and skin peeled off , severe burn"  ? Percocet [oxycodone-acetaminophen] Nausea Only, Other (See Comments)  ? hallucinations  ? ?  ? ?  ?Medication List  ?  ? ?  ? Accurate as of May 27, 2021  8:05 AM. If you have any questions, ask your nurse or doctor.  ?  ?  ? ?  ? ?amLODipine 5 MG tablet ?Commonly known as: NORVASC ?Take 5 mg by mouth daily. ?  ?BENFOTIAMINE MULTI-B PO ?Take by mouth. ?  ?ezetimibe 10 MG tablet ?Commonly known as: ZETIA ?Take 10 mg by mouth daily. ?  ?gabapentin 300 MG capsule ?Commonly  known as: NEURONTIN ?TAKE 2 CAPSULES BY MOUTH EVERY DAY AT BEDTIME ?What changed: additional instructions ?  ?irbesartan-hydrochlorothiazide 300-12.5 MG tablet ?Commonly known as: AVALIDE ?Take 1 tablet by mouth daily. ?  ?Klor-Con M20 20 MEQ tablet ?Generic drug: potassium chloride SA ?TAKE 2 TABLETS BY MOUTH TWICE A DAY ?  ?PROBIOTIC PO ?Take by mouth. ?  ? ?  ? ? ?Allergies:  ?Allergies  ?Allergen Reactions  ? Niaspan [Niacin] Other (See Comments)  ?  Severe flushing  ? Other Itching and Other (See Comments)  ?  After port placement pt had severe skin reaction in armpits and neck area "could see brush red brush marks, itched, burned and skin peeled off , severe burn"  ? Percocet [Oxycodone-Acetaminophen] Nausea Only and Other (See Comments)  ?  hallucinations  ? ? ?Past Medical History, Surgical history, Social history, and Family History were reviewed and updated. ? ?Review of Systems: ?Review of Systems  ?Constitutional: Negative.   ?HENT: Negative.    ?Eyes: Negative.   ?Respiratory: Negative.    ?Cardiovascular: Negative.   ?Gastrointestinal: Negative.   ?Genitourinary: Negative.   ?Musculoskeletal: Negative.   ?Skin: Negative.   ?Neurological:  Positive for tingling.  ?  Endo/Heme/Allergies: Negative.   ?Psychiatric/Behavioral: Negative.    ? ? ?Physical Exam: ? vitals were not taken for this visit.  ? ?Wt Readings from Last 3 Encounters:  ?05/15/20 264 lb (119.7 kg)  ?11/09/19 262 lb (118.8 kg)  ?05/10/19 252 lb 0.6 oz (114.3 kg)  ? ? ?Physical Exam ?Vitals reviewed.  ?HENT:  ?   Head: Normocephalic and atraumatic.  ?Eyes:  ?   Pupils: Pupils are equal, round, and reactive to light.  ?Cardiovascular:  ?   Rate and Rhythm: Normal rate and regular rhythm.  ?   Heart sounds: Normal heart sounds.  ?Pulmonary:  ?   Effort: Pulmonary effort is normal.  ?   Breath sounds: Normal breath sounds.  ?Abdominal:  ?   General: Bowel sounds are normal.  ?   Palpations: Abdomen is soft.  ?Musculoskeletal:     ?   General: No  tenderness or deformity. Normal range of motion.  ?   Cervical back: Normal range of motion.  ?Lymphadenopathy:  ?   Cervical: No cervical adenopathy.  ?Skin: ?   General: Skin is warm and dry.  ?   Findings: No erythema or rash.  ?Neurological:  ?   Mental Status: He is alert and oriented to person, place, and time.  ?Psychiatric:     ?   Behavior: Behavior normal.     ?   Thought Content: Thought content normal.     ?   Judgment: Judgment normal.  ? ? ? ?Lab Results  ?Component Value Date  ? WBC 4.5 05/13/2021  ? HGB 15.8 05/13/2021  ? HCT 43.2 05/13/2021  ? MCV 87.3 05/13/2021  ? PLT 215 05/13/2021  ? ?Lab Results  ?Component Value Date  ? FERRITIN 34 01/13/2016  ? IRON 41 (L) 01/13/2016  ? TIBC 369 01/13/2016  ? UIBC 328 01/13/2016  ? IRONPCTSAT 11 (L) 01/13/2016  ? ?Lab Results  ?Component Value Date  ? RBC 4.95 05/13/2021  ? ?No results found for: KPAFRELGTCHN, LAMBDASER, KAPLAMBRATIO ?No results found for: IGGSERUM, IGA, IGMSERUM ?No results found for: TOTALPROTELP, ALBUMINELP, A1GS, A2GS, BETS, BETA2SER, GAMS, MSPIKE, SPEI ?  Chemistry   ?   ?Component Value Date/Time  ? NA 142 05/13/2021 0749  ? NA 146 (H) 12/29/2016 5956  ? K 3.1 (L) 05/13/2021 0749  ? K 3.6 12/29/2016 0838  ? CL 104 05/13/2021 0749  ? CL 105 12/29/2016 0838  ? CO2 30 05/13/2021 0749  ? CO2 29 12/29/2016 0838  ? BUN 18 05/13/2021 0749  ? BUN 11 12/29/2016 0838  ? CREATININE 1.06 05/13/2021 0749  ? CREATININE 0.9 12/29/2016 0838  ?    ?Component Value Date/Time  ? CALCIUM 9.7 05/13/2021 0749  ? CALCIUM 9.9 12/29/2016 0838  ? ALKPHOS 62 05/13/2021 0749  ? ALKPHOS 71 12/29/2016 0838  ? AST 22 05/13/2021 0749  ? ALT 36 05/13/2021 0749  ? ALT 46 12/29/2016 0838  ? BILITOT 1.1 05/13/2021 0749  ?  ? ?Impression and Plan: Mr. Cornfield is a pleasant 56 yo gentleman with 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  He had radiation and chemotherapy.  We did a "sandwich protocol".  He did quite well with this. ? ?We  completed treatment back in September 2018. ? ?Again, I am not sure what to make of the CT report.  We will have to get the MRI.  We will try to get this next week. ? ?We probably will have to make the referral over  to Urology.  I know this is not a large tumor.  I will know if this might be able to be treated with RFA or cryoablation. ? ?I told Mr. Yono that if this is malignant, looks likely to be stage I.  The treatment it would be a whole lot easier than what he had for the colon cancer.  It is not related at all to the colon cancer. ? ?For right now, I would still like to keep his appointments as scheduled for 6 months.  He is doing well with this.  After 5 years, then we can probably go to once a year. ? ?I just hate the fact that we may have another malignancy to deal with.  However, kidney cancer typically is much more amenable to therapy.  The question is whether or not he would need surgical intervention or possibly Interventional Radiology intervention.  ?  ? ?[name], MD ?5/3/20238:05 AM ?

## 2021-06-01 ENCOUNTER — Other Ambulatory Visit: Payer: Self-pay | Admitting: Hematology & Oncology

## 2021-06-01 DIAGNOSIS — Z85038 Personal history of other malignant neoplasm of large intestine: Secondary | ICD-10-CM

## 2021-06-01 DIAGNOSIS — E876 Hypokalemia: Secondary | ICD-10-CM

## 2021-06-04 ENCOUNTER — Ambulatory Visit (HOSPITAL_COMMUNITY)
Admission: RE | Admit: 2021-06-04 | Discharge: 2021-06-04 | Disposition: A | Discharge status: Home / Self Care | Payer: BC Managed Care – PPO | Source: Ambulatory Visit | Attending: Hematology & Oncology | Admitting: Hematology & Oncology

## 2021-06-04 DIAGNOSIS — C649 Malignant neoplasm of unspecified kidney, except renal pelvis: Secondary | ICD-10-CM | POA: Diagnosis not present

## 2021-06-04 DIAGNOSIS — Z85038 Personal history of other malignant neoplasm of large intestine: Secondary | ICD-10-CM | POA: Diagnosis not present

## 2021-06-04 DIAGNOSIS — N2889 Other specified disorders of kidney and ureter: Secondary | ICD-10-CM | POA: Diagnosis not present

## 2021-06-04 DIAGNOSIS — K7689 Other specified diseases of liver: Secondary | ICD-10-CM | POA: Diagnosis not present

## 2021-06-04 MED ORDER — GADOBUTROL 1 MMOL/ML IV SOLN
10.0000 mL | Freq: Once | INTRAVENOUS | Status: AC | PRN
  Administered 2021-06-04: 10 mL via INTRAVENOUS

## 2021-06-09 ENCOUNTER — Telehealth: Payer: Self-pay

## 2021-06-09 NOTE — Telephone Encounter (Signed)
Appointment is scheduled with the patient on 06/15/21 at 2:40 pm. ?

## 2021-06-09 NOTE — Telephone Encounter (Signed)
-----   Message from Lavena Bullion, DO sent at 06/08/2021  6:27 PM EDT ----- ?Received a message from Dr. Marin Olp regarding expedited appointment for abnormal imaging study.  Please schedule appointment with me next week.  Overbook is ok.  Will then need to schedule expedited EGD, but we can tackle that at the time of appointment. ? ?Thanks. ? ?

## 2021-06-10 ENCOUNTER — Telehealth: Payer: Self-pay | Admitting: *Deleted

## 2021-06-10 ENCOUNTER — Other Ambulatory Visit: Payer: Self-pay | Admitting: Hematology & Oncology

## 2021-06-10 DIAGNOSIS — N289 Disorder of kidney and ureter, unspecified: Secondary | ICD-10-CM

## 2021-06-10 NOTE — Progress Notes (Signed)
I spoke to Dr. Dutch Gray today.  I put in a referral to Urology for this right renal nodule.  Dr. Alinda Money will be able to get him in.  He will talk to Mr. Ratajczak about the situation and the options. ? ?Lattie Haw, MD ?

## 2021-06-10 NOTE — Telephone Encounter (Signed)
Received a call from patient inquiring about his appt with urology.   Called patient back to let him know that Dr Marin Olp has spoken to Dr Alinda Money at [facility] and he should be hearing from their office very soon.   ?

## 2021-06-12 ENCOUNTER — Telehealth: Payer: Self-pay | Admitting: *Deleted

## 2021-06-12 NOTE — Telephone Encounter (Signed)
Received a call from [place] stating he had not heard from [facility] yet.  [facility].  They will reach out and give patient a call.

## 2021-06-15 ENCOUNTER — Encounter: Payer: Self-pay | Admitting: Gastroenterology

## 2021-06-15 ENCOUNTER — Ambulatory Visit: Payer: BC Managed Care – PPO | Admitting: Gastroenterology

## 2021-06-15 VITALS — BP 126/86 | HR 75 | Ht 73.0 in | Wt 256.5 lb

## 2021-06-15 DIAGNOSIS — C641 Malignant neoplasm of right kidney, except renal pelvis: Secondary | ICD-10-CM | POA: Diagnosis not present

## 2021-06-15 DIAGNOSIS — R9389 Abnormal findings on diagnostic imaging of other specified body structures: Secondary | ICD-10-CM | POA: Diagnosis not present

## 2021-06-15 DIAGNOSIS — Z85038 Personal history of other malignant neoplasm of large intestine: Secondary | ICD-10-CM

## 2021-06-15 NOTE — Progress Notes (Signed)
Chief Complaint: Abnormal imaging study   Referring Provider:     Burney Gauze, MD    HPI:     Vincent Strickland is a 56 y.o. male referred to the Gastroenterology Clinic for evaluation of recent abnormal imaging study.   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  He follows with Dr. Marin Olp in the Oncology clinic, last seen on 05/27/2021.  - 05/13/2021: Normal CEA, CBC.  K+ 3.1 otherwise normal CMP - 05/23/2021: CT C/A/P for continued surveillance: 2 cm mass in the right kidney.  No evidence of metastatic disease.  Has referral in place to Urology - 06/04/2021: MRI abdomen for evaluation of renal mass: Potential enhancing mass or polyp vs infolding of upper gastric wall along the upper margin of proximal stomach measuring 2.4 x 1.4 x 1.1 cm.  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  Otherwise no known UGI pathology.  Saw Urology today and planning surgical wedge resection for right renal mass.   Endoscopic History - 12/31/2015: Colonoscopy (Dr. Paulita Fujita): 8 mm sigmoid polyp, fungating ulcerated partially obstructing mass in the sigmoid (adenocarcinoma) - Colonoscopy approx 12/2016 or 01/2017 was unremarkable per patient. Scheduled for repeat with Dr. Paulita Fujita later this year.  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 ([place]) 01/07/2016   Headache    occas migraines   Hemorrhoid    High cholesterol    History of bladder infections    History of colon cancer, stage III 01/07/2016   History of kidney stones    History of radiation therapy 05/18/16-06/25/16   rectum 45 Gy in 25 fraction, rectom boost 5.4 Gy in 3 fractions   Hypertension    Hypogonadism male    Renal disorder    kidney stones    Vertigo, benign positional      Past Surgical History:  Procedure Laterality Date   COLONOSCOPY     Dr Paulita Fujita around 12/2016-1/20219   COLONOSCOPY WITH PROPOFOL Left 12/31/2015   Procedure: COLONOSCOPY WITH PROPOFOL;  Surgeon: Arta Silence, MD;  Location: [facility];  Service: Endoscopy;  Laterality: Left;   LAPAROSCOPIC SIGMOID COLECTOMY N/A 01/01/2016   Procedure: LAPAROSCOPIC ASSISTED SIGMOID COLECTOMY;  Surgeon: Johnathan Hausen, MD;  Location: [facility];  Service: General;  Laterality: N/A;   LITHOTRIPSY     PORT-A-CATH REMOVAL N/A 11/26/2016   Procedure: REMOVAL PORT-A-CATH;  Surgeon: Johnathan Hausen, MD;  Location: [place];  Service: General;  Laterality: N/A;   PORTACATH PLACEMENT N/A 01/27/2016   Procedure: INSERTION PORT-A-CATH;  Surgeon: Johnathan Hausen, MD;  Location: [facility];  Service: General;  Laterality: N/A;   TONSILLECTOMY     Family History  Problem Relation Age of Onset   Hypertension Mother    Cancer Mother        ovarian tumor   Cancer Father        prostate   Migraines Father    Hypertension Maternal Grandmother    Cancer Maternal Grandmother        esophageal   Diabetes Paternal Grandfather    COPD Paternal Grandfather    Cancer Maternal Grandfather        suspect colon   Social History   Tobacco Use  Smoking status: Never   Smokeless tobacco: Never  Vaping Use   Vaping Use: Never used  Substance Use Topics   Alcohol use: No   Drug use: No   Current Outpatient Medications  Medication Sig Dispense Refill   amLODipine (NORVASC) 5 MG tablet Take 5 mg by mouth daily.     B Complex-Folic Acid (BENFOTIAMINE MULTI-B PO) Take by mouth.     ezetimibe (ZETIA) 10 MG tablet Take 10 mg by mouth daily.     gabapentin (NEURONTIN) 300 MG capsule TAKE ONE CAP IN THE MORNING AND  2 CAPSULES  EVERY DAY AT BEDTIME. 90 capsule 4   irbesartan-hydrochlorothiazide (AVALIDE) 300-12.5 MG tablet Take 1 tablet by mouth daily.     potassium chloride SA  (KLOR-CON M) 20 MEQ tablet TAKE 2 TABLETS BY MOUTH TWICE DAILY 120 tablet 1   Probiotic Product (PROBIOTIC PO) Take by mouth.     No current facility-administered medications for this visit.   Allergies  Allergen Reactions   Niaspan [Niacin] Other (See Comments)    Severe flushing   Other Itching and Other (See Comments)    After port placement pt had severe skin reaction in armpits and neck area "could see brush red brush marks, itched, burned and skin peeled off , severe burn"   Percocet [Oxycodone-Acetaminophen] Nausea Only and Other (See Comments)    hallucinations     Review of Systems: All systems reviewed and negative except where noted in HPI.     Physical Exam:    Wt Readings from Last 3 Encounters:  06/15/21 256 lb 8 oz (116.3 kg)  05/27/21 251 lb (113.9 kg)  05/15/20 264 lb (119.7 kg)    Ht '6\' 1"'$  (1.854 m)   Wt 256 lb 8 oz (116.3 kg)   BMI 33.84 kg/m  Constitutional:  Pleasant, in no acute distress. Psychiatric: Normal mood and affect. Behavior is normal. Abdominal: Soft, nondistended Neurological: Alert and oriented to person place and time. Skin: Skin is warm and dry. No rashes noted.   ASSESSMENT AND PLAN;   1) Abnormal imaging study MRI abdomen that was being done for evaluation of renal mass with incidentally noted potential enhancing gastric mass versus polyp versus benign gastric infolding.  He is otherwise without upper GI symptoms.  Discussed DDx with patient today.  - Plan for expedited EGD tomorrow for diagnostic intent - Additional recommendations pending endoscopic findings  2) History of colon cancer History of distal sigmoid/proximal rectal cancer diagnosed in 2017, s/p surgical resection, chemotherapy, and radiation in 2018. - Ok to continue his f/u care for colon cancer surveillance with Dr. Paulita Fujita as already scheduled.  If he instead prefers to transfer his GI care here, will request prior colonoscopy reports and scheduled for colonoscopy  for continued surveillance accordingly   The indications, risks, and benefits of EGD were explained to the patient in detail. Risks include but are not limited to bleeding, perforation, adverse reaction to medications, and cardiopulmonary compromise. Sequelae include but are not limited to the possibility of surgery, hospitalization, and mortality. The patient verbalized understanding and wished to proceed. All questions answered, referred to scheduler. Further recommendations pending results of the exam.     Lavena Bullion, DO, FACG  06/15/2021, 2:41 PM   Jacelyn Grip Edwyna Shell, MD

## 2021-06-15 NOTE — Patient Instructions (Signed)
If you are age 54 or younger, your body mass index should be between 19-25. Your Body mass index is 33.84 kg/m. If this is out of the aformentioned range listed, please consider follow up with your Primary Care Provider.   ________________________________________________________  The [facility] providers would like to encourage you to use [facility] to communicate with providers for non-urgent requests or questions.  Due to long hold times on the telephone, sending your provider a message by [facility] may be a faster and more efficient way to get a response.  Please allow 48 business hours for a response.  Please remember that this is for non-urgent requests.  _______________________________________________________  Due to recent changes in healthcare laws, you may see the results of your imaging and laboratory studies on MyChart before your provider has had a chance to review them.  We understand that in some cases there may be results that are confusing or concerning to you. Not all laboratory results come back in the same time frame and the provider may be waiting for multiple results in order to interpret others.  Please give [place] 48 hours in order for your provider to thoroughly review all the results before contacting the office for clarification of your results.   You have been scheduled for an endoscopy. Please follow written instructions given to you at your visit today. If you use inhalers (even only as needed), please bring them with you on the day of your procedure.     Thank you for choosing me and [facility].  Vito Cirigliano, D.O.

## 2021-06-16 ENCOUNTER — Ambulatory Visit (AMBULATORY_SURGERY_CENTER): Payer: BC Managed Care – PPO | Admitting: Gastroenterology

## 2021-06-16 ENCOUNTER — Encounter: Payer: Self-pay | Admitting: Gastroenterology

## 2021-06-16 ENCOUNTER — Other Ambulatory Visit: Payer: Self-pay | Admitting: Urology

## 2021-06-16 ENCOUNTER — Other Ambulatory Visit: Payer: Self-pay

## 2021-06-16 VITALS — BP 106/74 | HR 63 | Temp 98.6°F | Resp 18 | Ht 73.0 in | Wt 256.8 lb

## 2021-06-16 DIAGNOSIS — Z85038 Personal history of other malignant neoplasm of large intestine: Secondary | ICD-10-CM

## 2021-06-16 DIAGNOSIS — R9389 Abnormal findings on diagnostic imaging of other specified body structures: Secondary | ICD-10-CM | POA: Diagnosis not present

## 2021-06-16 DIAGNOSIS — K2990 Gastroduodenitis, unspecified, without bleeding: Secondary | ICD-10-CM

## 2021-06-16 DIAGNOSIS — K2970 Gastritis, unspecified, without bleeding: Secondary | ICD-10-CM

## 2021-06-16 DIAGNOSIS — K3189 Other diseases of stomach and duodenum: Secondary | ICD-10-CM | POA: Diagnosis not present

## 2021-06-16 MED ORDER — PANTOPRAZOLE SODIUM 40 MG PO TBEC: 40.0000 mg | DELAYED_RELEASE_TABLET | Freq: Two times a day (BID) | ORAL | 1 refills | Status: DC

## 2021-06-16 MED ORDER — SODIUM CHLORIDE 0.9 % IV SOLN: 500.0000 mL | Freq: Once | INTRAVENOUS | Status: DC

## 2021-06-16 NOTE — Patient Instructions (Addendum)
Handout was given to your care partner on GASTRITIS. Start PROTONIX (pantoprazole) 40 mg 2x per day for 8 weeks to promote mucosal healing, then reduce to 40 mg DAILY and continue to titrate off if no further need for long-term acid suppression. You may resume your other current medications today. Await biopsy results.  Biopsies were sent [facility]. Please call if any questions or concerns.     YOU HAD AN ENDOSCOPIC PROCEDURE TODAY AT [facility]:   Refer to the procedure report that was given to you for any specific questions about what was found during the examination.  If the procedure report does not answer your questions, please call your gastroenterologist to clarify.  If you requested that your care partner not be given the details of your procedure findings, then the procedure report has been included in a sealed envelope for you to review at your convenience later.  YOU SHOULD EXPECT: Some feelings of bloating in the abdomen. Passage of more gas than usual.  Walking can help get rid of the air that was put into your GI tract during the procedure and reduce the bloating. If you had a lower endoscopy (such as a colonoscopy or flexible sigmoidoscopy) you may notice spotting of blood in your stool or on the toilet paper. If you underwent a bowel prep for your procedure, you may not have a normal bowel movement for a few days.  Please Note:  You might notice some irritation and congestion in your nose or some drainage.  This is from the oxygen used during your procedure.  There is no need for concern and it should clear up in a day or so.  SYMPTOMS TO REPORT IMMEDIATELY:    Following upper endoscopy (EGD)  Vomiting of blood or coffee ground material  New chest pain or pain under the shoulder blades  Painful or persistently difficult swallowing  New shortness of breath  Fever of 100F or higher  Black, tarry-looking stools  For urgent or emergent issues, a gastroenterologist  can be reached at any hour by calling [phone]. Do not use MyChart messaging for urgent concerns.    DIET:  We do recommend a small meal at first, but then you may proceed to your regular diet.  Drink plenty of fluids but you should avoid alcoholic beverages for 24 hours.  ACTIVITY:  You should plan to take it easy for the rest of today and you should NOT DRIVE or use heavy machinery until tomorrow (because of the sedation medicines used during the test).    FOLLOW UP: Our staff will call the number listed on your records 48-72 hours following your procedure to check on you and address any questions or concerns that you may have regarding the information given to you following your procedure. If we do not reach you, we will leave a message.  We will attempt to reach you two times.  During this call, we will ask if you have developed any symptoms of COVID 19. If you develop any symptoms (ie: fever, flu-like symptoms, shortness of breath, cough etc.) before then, please call [phone].  If you test positive for Covid 19 in the 2 weeks post procedure, please call and report this information to [place].    If any biopsies were taken you will be contacted by phone or by letter within the next 1-3 weeks.  Please call us at [phone] if you have not heard about the biopsies in 3 weeks.  SIGNATURES/CONFIDENTIALITY: You and/or your care partner have signed paperwork which will be entered into your electronic medical record.  These signatures attest to the fact that that the information above on your After Visit Summary has been reviewed and is understood.  Full responsibility of the confidentiality of this discharge information lies with you and/or your care-partner.

## 2021-06-16 NOTE — Op Note (Signed)
Vincent Strickland Patient Name: Vincent Strickland Procedure Date: 06/16/2021 7:56 AM MRN: [id number] Endoscopist: Gerrit Heck , MD Age: 56 Referring MD:  Date of Birth: 1965-08-04 Gender: Male Account #: [id number] Procedure:                Upper GI endoscopy Indications:              Abnormal MRI of the GI tract Medicines:                Monitored Anesthesia Care Procedure:                Pre-Anesthesia Assessment:                           - Prior to the procedure, a History and Physical                            was performed, and patient medications and                            allergies were reviewed. The patient's tolerance of                            previous anesthesia was also reviewed. The risks                            and benefits of the procedure and the sedation                            options and risks were discussed with the patient.                            All questions were answered, and informed consent                            was obtained. Prior Anticoagulants: The patient has                            taken no previous anticoagulant or antiplatelet                            agents. ASA Grade Assessment: II - A patient with                            mild systemic disease. After reviewing the risks                            and benefits, the patient was deemed in                            satisfactory condition to undergo the procedure.                           After obtaining informed consent, the endoscope was  passed under direct vision. Throughout the                            procedure, the patient's blood pressure, pulse, and                            oxygen saturations were monitored continuously. The                            Endoscope was introduced through the mouth, and                            advanced to the second part of duodenum. The upper                            GI endoscopy was  accomplished without difficulty.                            The patient tolerated the procedure well. Scope In: Scope Out: Findings:                 The examined esophagus was normal.                           The Z-line was regular and was found 40 cm from the                            incisors.                           Focal area of moderately congested mucosa was found                            in the gastric body. There was a small, shallow                            based, non-bleeding ulcer. The entire area measured                            approximately 2 cm. Several mucosal biopsies were                            taken with a cold forceps for histology (jar 1).                            Estimated blood loss was minimal.                           A second smaller area of mildly congested mucosa                            was found in the gastric body, measuring  approximately 1 cm. Several mucosal biopsies were                            taken with a cold forceps for histology (jar 2).                            Estimated blood loss was minimal.                           Scattered mild inflammation characterized by                            erythema was found in the gastric fundus, in the                            gastric body and in the gastric antrum. Biopsies                            were taken with a cold forceps for histology (jar                            3). Estimated blood loss was minimal.                           The examined duodenum was normal. Complications:            No immediate complications. Estimated Blood Loss:     Estimated blood loss was minimal. Impression:               - Normal esophagus.                           - Z-line regular, 40 cm from the incisors.                           - Focal areas of congestive gastropathy as outlined                            above. These were extensively biopsied.                            - Scattered areas of mild, non-ulcer gastritis.                            Biopsied.                           - Normal examined duodenum. Recommendation:           - Patient has a contact number available for                            emergencies. The signs and symptoms of potential                            delayed complications were discussed with the  patient. Return to normal activities tomorrow.                            Written discharge instructions were provided to the                            patient.                           - Resume previous diet today.                           - Continue present medications.                           - Await pathology results. If the biopsies are                            non-diagnostic, will discuss the role and utility                            of Endoscopic Ultrasound vs empiric treatment with                            high dose PPI and repeat EGD.                           - Start Protonix 40 mg PO BID x8 weeks to promote                            mucosal healing, then reduce to 40 mg daily and                            continue to titrate off if no further need for                            long-term acid suppression therapy. Gerrit Heck, MD 06/16/2021 8:24:10 AM

## 2021-06-16 NOTE — Progress Notes (Signed)
VSS, transported to PACU °

## 2021-06-16 NOTE — Progress Notes (Signed)
No problems noted in the recovery room. maw   Pt had a mail order pharmacy in the system.  I asked Dr. Bryan Lemma if he wanted pt to start [place] today.  He said yes.  Rx was sent to [facility] at the pt's request.  Pt's wife said she want not sure if mail order was the correct pharmacy that was in [name].  I went ahead and sent 3 month supple to [facility], and told pt and his wife to call us back with name of correct mail order pharmacy if they wanted [place] sent there.  maw

## 2021-06-16 NOTE — Progress Notes (Signed)
Called to room to assist during endoscopic procedure.  Patient ID and intended procedure confirmed with present staff. Received instructions for my participation in the procedure from the performing physician.  

## 2021-06-16 NOTE — Progress Notes (Signed)
GASTROENTEROLOGY PROCEDURE H&P NOTE   Primary Care Physician: Vernie Shanks, MD    Reason for Procedure:  Abnormal MRI of the stomach  Plan:    EGD  Patient is appropriate for endoscopic procedure(s) in the ambulatory ([facility]) setting.  The nature of the procedure, as well as the risks, benefits, and alternatives were carefully and thoroughly reviewed with the patient. Ample time for discussion and questions allowed. The patient understood, was satisfied, and agreed to proceed.     HPI: Vincent Strickland is a 56 y.o. male who presents for EGD for evaluation of abnormal MRI finding.  Patient was most recently seen in the Gastroenterology Clinic on 06/15/2021.  No interval change in medical history since that appointment. Please refer to that note for full details regarding GI history and clinical presentation.   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 ([place]) 01/07/2016   Headache    occas migraines   Hemorrhoid    High cholesterol    History of bladder infections    History of colon cancer, stage III 01/07/2016   History of kidney stones    History of radiation therapy 05/18/16-06/25/16   rectum 45 Gy in 25 fraction, rectom boost 5.4 Gy in 3 fractions   Hypertension    Hypogonadism male    Renal disorder    kidney stones   Vertigo, benign positional     Past Surgical History:  Procedure Laterality Date   COLONOSCOPY     Dr Paulita Fujita around 12/2016-1/20219   COLONOSCOPY WITH PROPOFOL Left 12/31/2015   Procedure: COLONOSCOPY WITH PROPOFOL;  Surgeon: Arta Silence, MD;  Location: [facility];  Service: Endoscopy;  Laterality: Left;   LAPAROSCOPIC SIGMOID COLECTOMY N/A 01/01/2016   Procedure: LAPAROSCOPIC ASSISTED SIGMOID COLECTOMY;  Surgeon: Johnathan Hausen, MD;  Location: [facility];  Service: General;  Laterality: N/A;   LITHOTRIPSY     PORT-A-CATH REMOVAL N/A 11/26/2016   Procedure: REMOVAL PORT-A-CATH;  Surgeon:  Johnathan Hausen, MD;  Location: [facility];  Service: General;  Laterality: N/A;   PORTACATH PLACEMENT N/A 01/27/2016   Procedure: INSERTION PORT-A-CATH;  Surgeon: Johnathan Hausen, MD;  Location: [facility];  Service: General;  Laterality: N/A;   TONSILLECTOMY     UPPER GASTROINTESTINAL ENDOSCOPY      Prior to Admission medications   Medication Sig Start Date End Date Taking? Authorizing Provider  amLODipine (NORVASC) 5 MG tablet Take 5 mg by mouth daily. 03/30/19  Yes [provider]  B Complex-Folic Acid (BENFOTIAMINE MULTI-B PO) Take by mouth.   Yes [provider]  ezetimibe (ZETIA) 10 MG tablet Take 10 mg by mouth daily. 03/19/20  Yes [provider]  gabapentin (NEURONTIN) 300 MG capsule TAKE ONE CAP IN THE MORNING AND  2 CAPSULES  EVERY DAY AT BEDTIME. 05/27/21  Yes Ennever, Rudell Cobb, MD  irbesartan-hydrochlorothiazide (AVALIDE) 300-12.5 MG tablet Take 1 tablet by mouth daily. 04/10/18  Yes [provider]  potassium chloride SA (KLOR-CON M) 20 MEQ tablet TAKE 2 TABLETS BY MOUTH TWICE DAILY 06/01/21  Yes Ennever, Rudell Cobb, MD  Probiotic Product (PROBIOTIC PO) Take by mouth.   Yes [provider]  prochlorperazine (COMPAZINE) 10 MG tablet Take 1 tablet (10 mg total) by mouth every 6 (six) hours as needed (Nausea or vomiting). Patient not taking: Reported on 04/21/2016 01/28/16 06/25/16  [name], MD    Current Outpatient Medications  Medication Sig Dispense Refill   amLODipine (  NORVASC) 5 MG tablet Take 5 mg by mouth daily.     B Complex-Folic Acid (BENFOTIAMINE MULTI-B PO) Take by mouth.     ezetimibe (ZETIA) 10 MG tablet Take 10 mg by mouth daily.     gabapentin (NEURONTIN) 300 MG capsule TAKE ONE CAP IN THE MORNING AND  2 CAPSULES  EVERY DAY AT BEDTIME. 90 capsule 4   irbesartan-hydrochlorothiazide (AVALIDE) 300-12.5 MG tablet Take 1 tablet by mouth daily.     potassium chloride SA (KLOR-CON M) 20 MEQ tablet TAKE 2 TABLETS BY MOUTH  TWICE DAILY 120 tablet 1   Probiotic Product (PROBIOTIC PO) Take by mouth.     Current Facility-Administered Medications  Medication Dose Route Frequency Provider Last Rate Last Admin   0.9 %  sodium chloride infusion  500 mL Intravenous Once Olie Scaffidi V, DO        Allergies as of 06/16/2021 - Review Complete 06/16/2021  Allergen Reaction Noted   Niaspan [niacin] Other (See Comments) 10/04/2012   Other Itching and Other (See Comments) 11/26/2016   Percocet [oxycodone-acetaminophen] Nausea Only and Other (See Comments) 10/04/2012    Family History  Problem Relation Age of Onset   Hypertension Mother    Cancer Mother        ovarian tumor   Cancer Father        prostate   Migraines Father    Esophageal cancer Maternal Grandmother    Hypertension Maternal Grandmother    Cancer Maternal Grandmother        esophageal   Colon cancer Maternal Grandfather    Cancer Maternal Grandfather        suspect colon   Diabetes Paternal Grandfather    COPD Paternal Grandfather    Rectal cancer Neg Hx    Stomach cancer Neg Hx     Social History   Socioeconomic History   Marital status: Married    Spouse name: Not on file   Number of children: Not on file   Years of education: Not on file   Highest education level: Not on file  Occupational History   Not on file  Tobacco Use   Smoking status: Never   Smokeless tobacco: Never  Vaping Use   Vaping Use: Never used  Substance and Sexual Activity   Alcohol use: Never   Drug use: Never   Sexual activity: Yes  Other Topics Concern   Not on file  Social History Narrative   Not on file   Social Determinants of Health   Financial Resource Strain: Not on file  Food Insecurity: Not on file  Transportation Needs: Not on file  Physical Activity: Not on file  Stress: Not on file  Social Connections: Not on file  Intimate Partner Violence: Not on file    Physical Exam: Vital signs in last 24 hours: '@BP'$  (!) 148/80   Pulse 66    Temp 98.6 F (37 C) (Temporal)   Ht '6\' 1"'$  (1.854 m)   Wt 256 lb 12.8 oz (116.5 kg)   SpO2 98%   BMI 33.88 kg/m  GEN: NAD EYE: Sclerae anicteric ENT: MMM CV: Non-tachycardic Pulm: CTA b/l GI: Soft, NT/ND NEURO:  Alert & Oriented x 3   Gerrit Heck, DO [facility]   06/16/2021 7:44 AM

## 2021-06-17 ENCOUNTER — Telehealth: Payer: Self-pay | Admitting: *Deleted

## 2021-06-17 NOTE — Telephone Encounter (Signed)
  Follow up Call-     06/16/2021    7:17 AM  Call back number  Post procedure Call Back phone  # [phone]  Permission to leave phone message Yes     Patient questions:  Do you have a fever, pain , or abdominal swelling? No. Pain Score  0 *  Have you tolerated food without any problems? Yes.    Have you been able to return to your normal activities? Yes.    Do you have any questions about your discharge instructions: Diet   No. Medications  No. Follow up visit  No.  Do you have questions or concerns about your Care? No.  Actions: * If pain score is 4 or above: No action needed, pain <4.

## 2021-06-19 ENCOUNTER — Telehealth: Payer: Self-pay

## 2021-06-19 ENCOUNTER — Other Ambulatory Visit: Payer: Self-pay

## 2021-06-19 DIAGNOSIS — R9389 Abnormal findings on diagnostic imaging of other specified body structures: Secondary | ICD-10-CM

## 2021-06-19 NOTE — Telephone Encounter (Signed)
-----   Message from [place], DO sent at 06/18/2021  5:20 PM EDT ----- This patient needs to be scheduled for an expedited repeat upper endoscopy next week with me, either Tuesday or Thursday.  Overbook w/ 7:30 spot is perfectly fine.  We need to coordinate with pathology on the day that this is being done since the biopsies need to be taken fresh and immediately to the pathologist for additional staining and fresh flow cytometry.  As soon as we know the date/time, can we please call Dr. Charlotte Sanes office [phone]) to ensure that they will have somebody from their staff on site here for immediate collection and processing.  Thank you.

## 2021-06-19 NOTE — Telephone Encounter (Signed)
Per [facility] & [facility], [facility] does not do transportation for [facility]. I've contacted [facility] who stated they do transport to [facility] & all we would need to do is call on Tuesday at time of procedure to [phone]. They will arrive within 20 minutes.

## 2021-06-19 NOTE — Telephone Encounter (Signed)
Patient has been scheduled for repeat EGD with Dr. Bryan Lemma on 06/23/21 at 10:00 am. Amb ref placed. Patient is aware & instructions have been given over the phone. He still has a copy of instructions from his previous EGD this week, and has been able to verbalize all understanding.

## 2021-06-19 NOTE — Telephone Encounter (Signed)
Per [facility] they will be in the office next Tuesday, however they do not come to the site for biopsy pick up. I have reached out to [facility] with [facility] to see if it is possible for specimen pick up. Left voicemail.

## 2021-06-23 ENCOUNTER — Encounter: Payer: Self-pay | Admitting: Gastroenterology

## 2021-06-23 ENCOUNTER — Other Ambulatory Visit (HOSPITAL_COMMUNITY)
Admission: RE | Admit: 2021-06-23 | Discharge: 2021-06-23 | Disposition: A | Discharge status: Home / Self Care | Payer: BC Managed Care – PPO | Source: Ambulatory Visit | Attending: Gastroenterology | Admitting: Gastroenterology

## 2021-06-23 ENCOUNTER — Ambulatory Visit (AMBULATORY_SURGERY_CENTER): Payer: BC Managed Care – PPO | Admitting: Gastroenterology

## 2021-06-23 VITALS — BP 98/76 | HR 65 | Temp 98.2°F | Resp 13 | Ht 73.0 in | Wt 256.0 lb

## 2021-06-23 DIAGNOSIS — Q403 Congenital malformation of stomach, unspecified: Secondary | ICD-10-CM | POA: Diagnosis not present

## 2021-06-23 DIAGNOSIS — R9389 Abnormal findings on diagnostic imaging of other specified body structures: Secondary | ICD-10-CM | POA: Insufficient documentation

## 2021-06-23 DIAGNOSIS — K2970 Gastritis, unspecified, without bleeding: Secondary | ICD-10-CM

## 2021-06-23 DIAGNOSIS — R933 Abnormal findings on diagnostic imaging of other parts of digestive tract: Secondary | ICD-10-CM | POA: Diagnosis not present

## 2021-06-23 DIAGNOSIS — K2990 Gastroduodenitis, unspecified, without bleeding: Secondary | ICD-10-CM | POA: Diagnosis not present

## 2021-06-23 DIAGNOSIS — K3189 Other diseases of stomach and duodenum: Secondary | ICD-10-CM | POA: Diagnosis not present

## 2021-06-23 MED ORDER — SODIUM CHLORIDE 0.9 % IV SOLN: 500.0000 mL | INTRAVENOUS | Status: DC

## 2021-06-23 NOTE — Op Note (Signed)
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 Case discussed with the Pathologist, and                            requesting repeat upper endoscopy with extensive                            biopsies sent in both saline for flow cytometry                            along with biopsies in formalin for additional                            staining and gene rearrangement testing. Medicines:                Monitored Anesthesia Care Procedure:                Pre-Anesthesia Assessment:                           - Prior to the procedure, a History and Physical                            was performed, and patient medications and                            allergies were reviewed. The patient's tolerance of  previous anesthesia was also reviewed. The risks                            and benefits of the procedure and the sedation                            options and risks were discussed with the patient.                            All questions were answered, and informed consent                            was obtained. Prior Anticoagulants: The patient has                            taken no previous anticoagulant or  antiplatelet                            agents. ASA Grade Assessment: II - A patient with                            mild systemic disease. After reviewing the risks                            and benefits, the patient was deemed in                            satisfactory condition to undergo the procedure.                           After obtaining informed consent, the endoscope was                            passed under direct vision. Throughout the                            procedure, the patient's blood pressure, pulse, and                            oxygen saturations were monitored continuously. The                            GIF [id number] #[id number] was introduced through the                            mouth, and advanced to the third part of duodenum.                            The upper GI endoscopy was accomplished without                            difficulty. The patient tolerated the procedure  well. Scope In: Scope Out: Findings:                 The examined esophagus was normal.                           Two focal areas of moderately edematous mucosa was                            found in the gastric body, the larger measuring                            approximately 2 cm and the smaller measuring 1 cm.                            Extensive biopsies were taken with a cold forceps                            for histology. Sent in both saline (jar 1) and                            typical path specimen container (jar 2). Estimated                            blood loss was minimal.                           Mildly congested mucosa was found in a scattered                            fashion throughout the gastric fundus and gastric                            body. This was previously extensively biospied.                           Localized mildly congested mucosa without active                            bleeding and with no stigmata of bleeding was found                             in the duodenal bulb. Biopsies were taken with a                            cold forceps for histology. Estimated blood loss                            was minimal.                           The second portion of the duodenum was normal. Complications:            No immediate complications. Estimated Blood Loss:     Estimated blood loss was minimal. Impression:               -  Normal esophagus.                           - Two focal areas of moderately edematous mucosa                            was found in the gastric body, the larger measuring                            approximately 2 cm and the smaller measuring 1 cm.                            Extensive biopsies were taken with a cold forceps                            for histology. Sent in both saline (jar 1) and                            typical path specimen container (jar 2).                           - Congestive gastropathy.                           - Congested duodenal mucosa. Biopsied.                           - Normal second portion of the duodenum. Recommendation:           - Patient has a contact number available for                            emergencies. The signs and symptoms of potential                            delayed complications were discussed with the                            patient. Return to normal activities tomorrow.                            Written discharge instructions were provided to the                            patient.                           - Resume previous diet.                           - Continue present medications.                           - Await pathology results.                           -  Follow-up with Dr. Marin Olp in the Oncology Clinic.                           - Check H pylori stool Ag, next available. Gerrit Heck, MD 06/23/2021 10:36:01 AM

## 2021-06-23 NOTE — Progress Notes (Signed)
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  The nature of the procedure, as well as the risks, benefits, and alternatives were carefully and thoroughly reviewed with the patient. Ample time for discussion and questions allowed. The patient understood, was satisfied, and agreed to proceed.     HPI: Vincent Strickland is a 56 y.o. male who presents for EGD with biopsies to evaluate for MALT Lymphoma.   EGD on 06/16/2021 with 2 cm area of moderately edematous mucosa in the gastric body with a single ulcer on 3-second smaller area of mildly edematous mucosa in the gastric body measuring 1 cm.  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  Case discussed with the Pathologist, and requesting repeat upper endoscopy with extensive biopsies sent in both saline for flow cytometry along with biopsies in formalin for additional staining and gene rearrangement testing.  He presents today for expedited repeat upper endoscopy to establish definitive diagnosis.  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 ([place]) 01/07/2016   Headache    occas migraines   Hemorrhoid    High cholesterol    History of bladder infections    History of colon cancer, stage III 01/07/2016   History of kidney stones    History of radiation therapy 05/18/16-06/25/16   rectum 45 Gy in 25 fraction, rectom boost 5.4 Gy in 3 fractions   Hypertension    Hypogonadism male    Renal disorder    kidney stones   Vertigo, benign positional     Past Surgical History:  Procedure Laterality Date   COLONOSCOPY     Dr Paulita Fujita around 12/2016-1/20219   COLONOSCOPY WITH PROPOFOL Left 12/31/2015   Procedure: COLONOSCOPY  WITH PROPOFOL;  Surgeon: Arta Silence, MD;  Location: [facility];  Service: Endoscopy;  Laterality: Left;   LAPAROSCOPIC SIGMOID COLECTOMY N/A 01/01/2016   Procedure: LAPAROSCOPIC ASSISTED SIGMOID COLECTOMY;  Surgeon: Johnathan Hausen, MD;  Location: [facility];  Service: General;  Laterality: N/A;   LITHOTRIPSY     PORT-A-CATH REMOVAL N/A 11/26/2016   Procedure: REMOVAL PORT-A-CATH;  Surgeon: Johnathan Hausen, MD;  Location: [facility];  Service: General;  Laterality: N/A;   PORTACATH PLACEMENT N/A 01/27/2016   Procedure: INSERTION PORT-A-CATH;  Surgeon: Johnathan Hausen, MD;  Location: [facility];  Service: General;  Laterality: N/A;   TONSILLECTOMY     UPPER GASTROINTESTINAL ENDOSCOPY      Prior to Admission medications   Medication Sig Start Date End Date Taking? Authorizing Provider  amLODipine (NORVASC) 5 MG tablet Take 5 mg by mouth daily. 03/30/19  Yes [provider]  B Complex-Folic Acid (BENFOTIAMINE MULTI-B PO) Take by mouth.   Yes [provider]  ezetimibe (ZETIA) 10 MG tablet Take 10 mg by mouth daily. 03/19/20  Yes [provider]  gabapentin (NEURONTIN) 300 MG capsule TAKE ONE CAP IN THE MORNING AND  2 CAPSULES  EVERY DAY AT BEDTIME. 05/27/21  Yes Ennever, Rudell Cobb, MD  irbesartan-hydrochlorothiazide (AVALIDE) 300-12.5 MG tablet Take 1 tablet by mouth daily. 04/10/18  Yes [provider]  pantoprazole (PROTONIX) 40 MG tablet Take 1 tablet (40 mg total) by mouth 2 (two) times daily. Best to take on  an empty stomach 20-30 before food.  Start Protonix 40 mg po BID x 8 weeks, then reduce to 40 mg daily and continue to titrate off if no further need for long-term acid suppression therapy. 06/16/21  Yes Keirston Saephanh V, DO  potassium chloride SA (KLOR-CON M) 20 MEQ tablet TAKE 2 TABLETS BY MOUTH TWICE DAILY 06/01/21  Yes Ennever, Rudell Cobb, MD  Probiotic Product (PROBIOTIC PO) Take by mouth.   Yes [provider]  prochlorperazine (COMPAZINE)  10 MG tablet Take 1 tablet (10 mg total) by mouth every 6 (six) hours as needed (Nausea or vomiting). Patient not taking: Reported on 04/21/2016 01/28/16 06/25/16  [name], MD    Current Outpatient Medications  Medication Sig Dispense Refill   amLODipine (NORVASC) 5 MG tablet Take 5 mg by mouth daily.     B Complex-Folic Acid (BENFOTIAMINE MULTI-B PO) Take by mouth.     ezetimibe (ZETIA) 10 MG tablet Take 10 mg by mouth daily.     gabapentin (NEURONTIN) 300 MG capsule TAKE ONE CAP IN THE MORNING AND  2 CAPSULES  EVERY DAY AT BEDTIME. 90 capsule 4   irbesartan-hydrochlorothiazide (AVALIDE) 300-12.5 MG tablet Take 1 tablet by mouth daily.     pantoprazole (PROTONIX) 40 MG tablet Take 1 tablet (40 mg total) by mouth 2 (two) times daily. Best to take on an empty stomach 20-30 before food.  Start Protonix 40 mg po BID x 8 weeks, then reduce to 40 mg daily and continue to titrate off if no further need for long-term acid suppression therapy. 180 tablet 1   potassium chloride SA (KLOR-CON M) 20 MEQ tablet TAKE 2 TABLETS BY MOUTH TWICE DAILY 120 tablet 1   Probiotic Product (PROBIOTIC PO) Take by mouth.     Current Facility-Administered Medications  Medication Dose Route Frequency Provider Last Rate Last Admin   0.9 %  sodium chloride infusion  500 mL Intravenous Continuous Yosef Krogh V, DO        Allergies as of 06/23/2021 - Review Complete 06/23/2021  Allergen Reaction Noted   Niaspan [niacin] Other (See Comments) 10/04/2012   Other Itching and Other (See Comments) 11/26/2016   Percocet [oxycodone-acetaminophen] Nausea Only and Other (See Comments) 10/04/2012    Family History  Problem Relation Age of Onset   Hypertension Mother    Cancer Mother        ovarian tumor   Cancer Father        prostate   Migraines Father    Esophageal cancer Maternal Grandmother    Hypertension Maternal Grandmother    Cancer Maternal Grandmother        esophageal   Colon cancer Maternal  Grandfather    Cancer Maternal Grandfather        suspect colon   Diabetes Paternal Grandfather    COPD Paternal Grandfather    Rectal cancer Neg Hx    Stomach cancer Neg Hx     Social History   Socioeconomic History   Marital status: Married    Spouse name: Not on file   Number of children: Not on file   Years of education: Not on file   Highest education level: Not on file  Occupational History   Not on file  Tobacco Use   Smoking status: Never   Smokeless tobacco: Never  Vaping Use   Vaping Use: Never used  Substance and Sexual Activity   Alcohol use: Never   Drug use: Never   Sexual activity: Yes  Other Topics Concern   Not on file  Social History Narrative   Not on file   Social Determinants of Health   Financial Resource Strain: Not on file  Food Insecurity: Not on file  Transportation Needs: Not on file  Physical Activity: Not on file  Stress: Not on file  Social Connections: Not on file  Intimate Partner Violence: Not on file    Physical Exam: Vital signs in last 24 hours: '@BP'$  140/77   Pulse 66   Temp 98.2 F (36.8 C)   Ht '6\' 1"'$  (1.854 m)   Wt 256 lb (116.1 kg)   SpO2 98%   BMI 33.78 kg/m  GEN: NAD EYE: Sclerae anicteric ENT: MMM CV: Non-tachycardic Pulm: CTA b/l GI: Soft, NT/ND NEURO:  Alert & Oriented x 3   Gerrit Heck, DO [facility]   06/23/2021 9:57 AM

## 2021-06-23 NOTE — Progress Notes (Signed)
Pt non-responsive, VVS, Report to RN  °

## 2021-06-23 NOTE — Patient Instructions (Signed)
YOU HAD AN ENDOSCOPIC PROCEDURE TODAY AT THE [facility]:   Refer to the procedure report that was given to you for any specific questions about what was found during the examination.  If the procedure report does not answer your questions, please call your gastroenterologist to clarify.  If you requested that your care partner not be given the details of your procedure findings, then the procedure report has been included in a sealed envelope for you to review at your convenience later.  YOU SHOULD EXPECT: Some feelings of bloating in the abdomen. Passage of more gas than usual.  Walking can help get rid of the air that was put into your GI tract during the procedure and reduce the bloating. If you had a lower endoscopy (such as a colonoscopy or flexible sigmoidoscopy) you may notice spotting of blood in your stool or on the toilet paper. If you underwent a bowel prep for your procedure, you may not have a normal bowel movement for a few days.  Please Note:  You might notice some irritation and congestion in your nose or some drainage.  This is from the oxygen used during your procedure.  There is no need for concern and it should clear up in a day or so.  SYMPTOMS TO REPORT IMMEDIATELY:    Following upper endoscopy (EGD)  Vomiting of blood or coffee ground material  New chest pain or pain under the shoulder blades  Painful or persistently difficult swallowing  New shortness of breath  Fever of 100F or higher  Black, tarry-looking stools  For urgent or emergent issues, a gastroenterologist can be reached at any hour by calling [phone]. Do not use MyChart messaging for urgent concerns.    DIET:  We do recommend a small meal at first, but then you may proceed to your regular diet.  Drink plenty of fluids but you should avoid alcoholic beverages for 24 hours.  ACTIVITY:  You should plan to take it easy for the rest of today and you should NOT DRIVE or use heavy machinery  until tomorrow (because of the sedation medicines used during the test).    FOLLOW UP: Our staff will call the number listed on your records 48-72 hours following your procedure to check on you and address any questions or concerns that you may have regarding the information given to you following your procedure. If we do not reach you, we will leave a message.  We will attempt to reach you two times.  During this call, we will ask if you have developed any symptoms of COVID 19. If you develop any symptoms (ie: fever, flu-like symptoms, shortness of breath, cough etc.) before then, please call [phone].  If you test positive for Covid 19 in the 2 weeks post procedure, please call and report this information to us.    If any biopsies were taken you will be contacted by phone or by letter within the next 1-3 weeks.  Please call us at [phone] if you have not heard about the biopsies in 3 weeks.    SIGNATURES/CONFIDENTIALITY: You and/or your care partner have signed paperwork which will be entered into your electronic medical record.  These signatures attest to the fact that that the information above on your After Visit Summary has been reviewed and is understood.  Full responsibility of the confidentiality of this discharge information lies with you and/or your care-partner. 

## 2021-06-23 NOTE — Progress Notes (Signed)
Called to room to assist during endoscopic procedure.  Patient ID and intended procedure confirmed with present staff. Received instructions for my participation in the procedure from the performing physician.  

## 2021-06-24 ENCOUNTER — Telehealth: Payer: Self-pay | Admitting: Gastroenterology

## 2021-06-24 ENCOUNTER — Other Ambulatory Visit: Payer: BC Managed Care – PPO

## 2021-06-24 ENCOUNTER — Other Ambulatory Visit: Payer: Self-pay

## 2021-06-24 ENCOUNTER — Telehealth: Payer: Self-pay | Admitting: *Deleted

## 2021-06-24 DIAGNOSIS — K2970 Gastritis, unspecified, without bleeding: Secondary | ICD-10-CM

## 2021-06-24 DIAGNOSIS — K2990 Gastroduodenitis, unspecified, without bleeding: Secondary | ICD-10-CM | POA: Diagnosis not present

## 2021-06-24 LAB — SURGICAL PATHOLOGY

## 2021-06-24 NOTE — Telephone Encounter (Signed)
Spoke with pt's wife and let her know where to drop off stool sample. Pt's wife stated they received supplies yesterday after upper endoscopy. Order placed for H. Pylori stool test.

## 2021-06-24 NOTE — Telephone Encounter (Signed)
  Follow up Call-     06/23/2021    9:14 AM 06/16/2021    7:17 AM  Call back number  Post procedure Call Back phone  # [phone]  Permission to leave phone message Yes Yes     Patient questions:  Do you have a fever, pain , or abdominal swelling? No. Pain Score  0 *  Have you tolerated food without any problems? Yes.    Have you been able to return to your normal activities? Yes.    Do you have any questions about your discharge instructions: Diet   No. Medications  No. Follow up visit  No.  Do you have questions or concerns about your Care? No.  Actions: * If pain score is 4 or above: No action needed, pain <4.

## 2021-06-24 NOTE — Telephone Encounter (Signed)
Inbound call from patients wife seeking advice where she needs to drop patients stool sample off at. Please advise.

## 2021-06-27 LAB — H. PYLORI ANTIGEN, STOOL: H pylori Ag, Stl: NEGATIVE

## 2021-06-30 NOTE — Patient Instructions (Addendum)
DUE TO COVID-19 ONLY TWO VISITORS  (aged 54 and older)  ARE ALLOWED TO COME WITH YOU AND STAY IN THE WAITING ROOM ONLY DURING PRE OP AND PROCEDURE.   **NO VISITORS ARE ALLOWED IN THE SHORT STAY AREA OR RECOVERY ROOM!!**  IF YOU WILL BE ADMITTED INTO THE HOSPITAL YOU ARE ALLOWED ONLY FOUR SUPPORT PEOPLE DURING VISITATION HOURS ONLY (7 AM -8PM)   The support person(s) must pass our screening, gel in and out, and wear a mask at all times, including in the patient's room. Patients must also wear a mask when staff or their support person are in the room. Visitors GUEST BADGE MUST BE WORN VISIBLY  One adult visitor may remain with you overnight and MUST be in the room by 8 P.M.     Your procedure is scheduled on: 07/08/21   Report to [facility] Main Entrance    Report to admitting at : 9:45 AM   Call this number if you have problems the morning of surgery [phone]   Clear liquids starting the day before surgery until: 9:00 AM DAY OF SURGERY  Water Black Coffee (sugar ok, NO MILK/CREAM OR CREAMERS)  Tea (sugar ok, NO MILK/CREAM OR CREAMERS) regular and decaf                             Plain Jell-O (NO RED)                                           Fruit ices (not with fruit pulp, NO RED)                                     Popsicles (NO RED)                                                                  Juice: apple, WHITE grape, WHITE cranberry Sports drinks like Gatorade (NO RED) Clear broth(vegetable,chicken,beef)  FOLLOW BOWEL PREP AND ANY ADDITIONAL PRE OP INSTRUCTIONS YOU RECEIVED FROM YOUR SURGEON'S OFFICE!!!     Oral Hygiene is also important to reduce your risk of infection.                                    Remember - BRUSH YOUR TEETH THE MORNING OF SURGERY WITH YOUR REGULAR TOOTHPASTE   Do NOT smoke after Midnight   Take these medicines the morning of surgery with A SIP OF WATER: gabapentin,amlodipine,pantoprazole.  DO NOT TAKE ANY ORAL DIABETIC  MEDICATIONS DAY OF YOUR SURGERY  Bring CPAP mask and tubing day of surgery.                              You may not have any metal on your body including hair pins, jewelry, and body piercing             Do not wear lotions, powders, perfumes/cologne, or deodorant  Men may shave face and neck.   Do not bring valuables to the hospital. [place].   Contacts, dentures or bridgework may not be worn into surgery.   Bring small overnight bag day of surgery.    Patients discharged on the day of surgery will not be allowed to drive home.  Someone NEEDS to stay with you for the first 24 hours after anesthesia.   Special Instructions: Bring a copy of your healthcare power of attorney and living will documents         the day of surgery if you haven't scanned them before.              Please read over the following fact sheets you were given: IF YOU HAVE QUESTIONS ABOUT YOUR PRE-OP INSTRUCTIONS PLEASE CALL [phone]     [facility] - Preparing for Surgery Before surgery, you can play an important role.  Because skin is not sterile, your skin needs to be as free of germs as possible.  You can reduce the number of germs on your skin by washing with CHG (chlorahexidine gluconate) soap before surgery.  CHG is an antiseptic cleaner which kills germs and bonds with the skin to continue killing germs even after washing. Please DO NOT use if you have an allergy to CHG or antibacterial soaps.  If your skin becomes reddened/irritated stop using the CHG and inform your nurse when you arrive at Short Stay. Do not shave (including legs and underarms) for at least 48 hours prior to the first CHG shower.  You may shave your face/neck. Please follow these instructions carefully:  1.  Shower with CHG Soap the night before surgery and the  morning of Surgery.  2.  If you choose to wash your hair, wash your hair first as usual with your  normal   shampoo.  3.  After you shampoo, rinse your hair and body thoroughly to remove the  shampoo.                           4.  Use CHG as you would any other liquid soap.  You can apply chg directly  to the skin and wash                       Gently with a scrungie or clean washcloth.  5.  Apply the CHG Soap to your body ONLY FROM THE NECK DOWN.   Do not use on face/ open                           Wound or open sores. Avoid contact with eyes, ears mouth and genitals (private parts).                       Wash face,  Genitals (private parts) with your normal soap.             6.  Wash thoroughly, paying special attention to the area where your surgery  will be performed.  7.  Thoroughly rinse your body with warm water from the neck down.  8.  DO NOT shower/wash with your normal soap after using and rinsing off  the CHG Soap.  9.  Pat yourself dry with a clean towel.            10.  Wear clean pajamas.            11.  Place clean sheets on your bed the night of your first shower and do not  sleep with pets. Day of Surgery : Do not apply any lotions/deodorants the morning of surgery.  Please wear clean clothes to the hospital/surgery center.  FAILURE TO FOLLOW THESE INSTRUCTIONS MAY RESULT IN THE CANCELLATION OF YOUR SURGERY PATIENT SIGNATURE_________________________________  NURSE SIGNATURE__________________________________  ________________________________________________________________________

## 2021-07-01 ENCOUNTER — Encounter (HOSPITAL_COMMUNITY)
Admission: RE | Admit: 2021-07-01 | Discharge: 2021-07-01 | Disposition: A | Discharge status: Home / Self Care | Payer: BC Managed Care – PPO | Source: Ambulatory Visit | Attending: Urology | Admitting: Urology

## 2021-07-01 ENCOUNTER — Encounter (HOSPITAL_COMMUNITY): Payer: Self-pay

## 2021-07-01 ENCOUNTER — Other Ambulatory Visit: Payer: Self-pay

## 2021-07-01 VITALS — BP 151/110 | HR 65 | Temp 98.4°F | Resp 18 | Ht 73.0 in | Wt 252.0 lb

## 2021-07-01 DIAGNOSIS — Z01818 Encounter for other preprocedural examination: Secondary | ICD-10-CM | POA: Insufficient documentation

## 2021-07-01 DIAGNOSIS — I10 Essential (primary) hypertension: Secondary | ICD-10-CM | POA: Diagnosis not present

## 2021-07-01 LAB — CBC
HCT: 44.6 % (ref 39.0–52.0)
Hemoglobin: 16.1 g/dL (ref 13.0–17.0)
MCH: 31.8 pg (ref 26.0–34.0)
MCHC: 36.1 g/dL — ABNORMAL HIGH (ref 30.0–36.0)
MCV: 88.1 fL (ref 80.0–100.0)
Platelets: 247 10*3/uL (ref 150–400)
RBC: 5.06 MIL/uL (ref 4.22–5.81)
RDW: 13.2 % (ref 11.5–15.5)
WBC: 5.3 10*3/uL (ref 4.0–10.5)
nRBC: 0 % (ref 0.0–0.2)

## 2021-07-01 LAB — BASIC METABOLIC PANEL
Anion gap: 8 (ref 5–15)
BUN: 18 mg/dL (ref 6–20)
CO2: 27 mmol/L (ref 22–32)
Calcium: 9.7 mg/dL (ref 8.9–10.3)
Chloride: 108 mmol/L (ref 98–111)
Creatinine, Ser: 0.93 mg/dL (ref 0.61–1.24)
GFR, Estimated: >60 mL/min (ref >60)
Glucose, Bld: 109 mg/dL — ABNORMAL HIGH (ref 70–99)
Potassium: 3.1 mmol/L — ABNORMAL LOW (ref 3.5–5.1)
Sodium: 143 mmol/L (ref 135–145)

## 2021-07-01 NOTE — Progress Notes (Addendum)
For Short Stay: [place] appointment date: Date of COVID positive in last 74 days:  Bowel Prep reminder:   For Anesthesia: PCP - Dr. Yaakov Guthrie Cardiologist -   Chest x-ray - CT chest: 05/22/21 EKG -  Stress Test -  ECHO -  Cardiac Cath -  Pacemaker/ICD device last checked: Pacemaker orders received: Device Rep notified:  Spinal Cord Stimulator:  Sleep Study -  CPAP -   Fasting Blood Sugar -  Checks Blood Sugar _____ times a day Date and result of last Hgb A1c-  Blood Thinner Instructions: Aspirin Instructions: Last Dose:  Activity level: Can go up a flight of stairs and activities of daily living without stopping and without chest pain and/or shortness of breath   Able to exercise without chest pain and/or shortness of breath   Unable to go up a flight of stairs without chest pain and/or shortness of breath     Anesthesia review: Hx HTN. BP was elevated during PAT visit: 156/112,163/106,151/110. Pt. Was advised to keep checking BP at home,and to reach PCP if continue elevated.  Patient denies shortness of breath, fever, cough and chest pain at PAT appointment   Patient verbalized understanding of instructions that were given to them at the PAT appointment. Patient was also instructed that they will need to review over the PAT instructions again at home before surgery.

## 2021-07-02 ENCOUNTER — Telehealth: Payer: Self-pay | Admitting: *Deleted

## 2021-07-02 NOTE — Telephone Encounter (Signed)
Returned call Left message for patient. Per dr Marin Olp he has spoken to Dr. Reyne Dumas and will personally call the  Patient.

## 2021-07-06 DIAGNOSIS — I10 Essential (primary) hypertension: Secondary | ICD-10-CM | POA: Diagnosis not present

## 2021-07-08 ENCOUNTER — Other Ambulatory Visit: Payer: Self-pay

## 2021-07-08 ENCOUNTER — Encounter (HOSPITAL_COMMUNITY)
Admission: RE | Disposition: A | Discharge status: Home / Self Care | Payer: Self-pay | Source: Home / Self Care | Attending: Urology

## 2021-07-08 ENCOUNTER — Ambulatory Visit (HOSPITAL_COMMUNITY): Payer: BC Managed Care – PPO | Admitting: Anesthesiology

## 2021-07-08 ENCOUNTER — Encounter (HOSPITAL_COMMUNITY): Payer: Self-pay | Admitting: Urology

## 2021-07-08 ENCOUNTER — Observation Stay (HOSPITAL_COMMUNITY)
Admission: RE | Admit: 2021-07-08 | Discharge: 2021-07-09 | Disposition: A | Discharge status: Home / Self Care | Payer: BC Managed Care – PPO | Attending: Urology | Admitting: Urology

## 2021-07-08 DIAGNOSIS — C641 Malignant neoplasm of right kidney, except renal pelvis: Secondary | ICD-10-CM | POA: Diagnosis not present

## 2021-07-08 DIAGNOSIS — N2889 Other specified disorders of kidney and ureter: Secondary | ICD-10-CM | POA: Diagnosis not present

## 2021-07-08 DIAGNOSIS — N281 Cyst of kidney, acquired: Secondary | ICD-10-CM | POA: Diagnosis not present

## 2021-07-08 DIAGNOSIS — Z8579 Personal history of other malignant neoplasms of lymphoid, hematopoietic and related tissues: Secondary | ICD-10-CM | POA: Insufficient documentation

## 2021-07-08 DIAGNOSIS — I10 Essential (primary) hypertension: Secondary | ICD-10-CM | POA: Insufficient documentation

## 2021-07-08 DIAGNOSIS — Z85038 Personal history of other malignant neoplasm of large intestine: Secondary | ICD-10-CM | POA: Insufficient documentation

## 2021-07-08 HISTORY — PX: ROBOTIC ASSITED PARTIAL NEPHRECTOMY: SHX6087

## 2021-07-08 LAB — HEMOGLOBIN AND HEMATOCRIT, BLOOD
HCT: 45.3 % (ref 39.0–52.0)
Hemoglobin: 16.4 g/dL (ref 13.0–17.0)

## 2021-07-08 LAB — TYPE AND SCREEN
ABO/RH(D): A POS
Antibody Screen: NEGATIVE

## 2021-07-08 SURGERY — NEPHRECTOMY, PARTIAL, ROBOT-ASSISTED
Anesthesia: General | Site: Abdomen | Laterality: Right

## 2021-07-08 MED ORDER — SODIUM CHLORIDE (PF) 0.9 % IJ SOLN
INTRAMUSCULAR | Status: AC
  Filled 2021-07-08: qty 20

## 2021-07-08 MED ORDER — ACETAMINOPHEN 500 MG PO TABS
1000.0000 mg | ORAL_TABLET | Freq: Four times a day (QID) | ORAL | Status: AC
  Administered 2021-07-08 – 2021-07-09 (×3): 1000 mg via ORAL
  Filled 2021-07-08 (×3): qty 2

## 2021-07-08 MED ORDER — MIDAZOLAM HCL 5 MG/5ML IJ SOLN
INTRAMUSCULAR | Status: DC | PRN
  Administered 2021-07-08: 2 mg via INTRAVENOUS

## 2021-07-08 MED ORDER — DEXMEDETOMIDINE (PRECEDEX) IN NS 20 MCG/5ML (4 MCG/ML) IV SYRINGE
PREFILLED_SYRINGE | INTRAVENOUS | Status: DC | PRN
  Administered 2021-07-08: 4 ug via INTRAVENOUS
  Administered 2021-07-08: 8 ug via INTRAVENOUS

## 2021-07-08 MED ORDER — DOCUSATE SODIUM 100 MG PO CAPS
100.0000 mg | ORAL_CAPSULE | Freq: Two times a day (BID) | ORAL | Status: DC
  Administered 2021-07-08 – 2021-07-09 (×2): 100 mg via ORAL
  Filled 2021-07-08 (×2): qty 1

## 2021-07-08 MED ORDER — GABAPENTIN 300 MG PO CAPS
300.0000 mg | ORAL_CAPSULE | Freq: Two times a day (BID) | ORAL | Status: DC
  Administered 2021-07-08 – 2021-07-09 (×2): 300 mg via ORAL
  Filled 2021-07-08 (×2): qty 1

## 2021-07-08 MED ORDER — ROCURONIUM BROMIDE 10 MG/ML (PF) SYRINGE
PREFILLED_SYRINGE | INTRAVENOUS | Status: AC
  Filled 2021-07-08: qty 10

## 2021-07-08 MED ORDER — SODIUM CHLORIDE 0.9 % IV SOLN: INTRAVENOUS | Status: DC

## 2021-07-08 MED ORDER — FENTANYL CITRATE (PF) 100 MCG/2ML IJ SOLN
INTRAMUSCULAR | Status: DC | PRN
  Administered 2021-07-08 (×4): 50 ug via INTRAVENOUS
  Administered 2021-07-08: 100 ug via INTRAVENOUS

## 2021-07-08 MED ORDER — DOCUSATE SODIUM 100 MG PO CAPS: 100.0000 mg | ORAL_CAPSULE | Freq: Two times a day (BID) | ORAL | Status: DC

## 2021-07-08 MED ORDER — DEXAMETHASONE SODIUM PHOSPHATE 10 MG/ML IJ SOLN
INTRAMUSCULAR | Status: AC
  Filled 2021-07-08: qty 1

## 2021-07-08 MED ORDER — OXYCODONE HCL 5 MG/5ML PO SOLN: 5.0000 mg | Freq: Once | ORAL | Status: DC | PRN

## 2021-07-08 MED ORDER — GLYCOPYRROLATE 0.2 MG/ML IJ SOLN
INTRAMUSCULAR | Status: DC | PRN
  Administered 2021-07-08: .2 mg via INTRAVENOUS

## 2021-07-08 MED ORDER — BUPIVACAINE LIPOSOME 1.3 % IJ SUSP
INTRAMUSCULAR | Status: DC | PRN
  Administered 2021-07-08: 20 mL

## 2021-07-08 MED ORDER — PROPOFOL 10 MG/ML IV BOLUS
INTRAVENOUS | Status: AC
  Filled 2021-07-08: qty 20

## 2021-07-08 MED ORDER — PANTOPRAZOLE SODIUM 40 MG PO TBEC
40.0000 mg | DELAYED_RELEASE_TABLET | Freq: Two times a day (BID) | ORAL | Status: DC
  Administered 2021-07-08 – 2021-07-09 (×2): 40 mg via ORAL
  Filled 2021-07-08 (×2): qty 1

## 2021-07-08 MED ORDER — FENTANYL CITRATE PF 50 MCG/ML IJ SOSY
PREFILLED_SYRINGE | INTRAMUSCULAR | Status: AC
  Filled 2021-07-08: qty 2

## 2021-07-08 MED ORDER — FENTANYL CITRATE (PF) 100 MCG/2ML IJ SOLN
INTRAMUSCULAR | Status: AC
  Filled 2021-07-08: qty 2

## 2021-07-08 MED ORDER — ONDANSETRON HCL 4 MG/2ML IJ SOLN: 4.0000 mg | Freq: Once | INTRAMUSCULAR | Status: DC | PRN

## 2021-07-08 MED ORDER — DIPHENHYDRAMINE HCL 50 MG/ML IJ SOLN: 12.5000 mg | Freq: Four times a day (QID) | INTRAMUSCULAR | Status: DC | PRN

## 2021-07-08 MED ORDER — ONDANSETRON HCL 4 MG/2ML IJ SOLN
INTRAMUSCULAR | Status: DC | PRN
  Administered 2021-07-08: 4 mg via INTRAVENOUS

## 2021-07-08 MED ORDER — SODIUM CHLORIDE 0.9% FLUSH
INTRAVENOUS | Status: DC | PRN
  Administered 2021-07-08: 20 mL

## 2021-07-08 MED ORDER — SODIUM CHLORIDE 0.9 % IV SOLN: INTRAVENOUS | Status: DC | PRN

## 2021-07-08 MED ORDER — ACETAMINOPHEN 500 MG PO TABS
1000.0000 mg | ORAL_TABLET | Freq: Once | ORAL | Status: AC
  Administered 2021-07-08: 1000 mg via ORAL
  Filled 2021-07-08: qty 2

## 2021-07-08 MED ORDER — ROCURONIUM BROMIDE 100 MG/10ML IV SOLN
INTRAVENOUS | Status: DC | PRN
  Administered 2021-07-08: 30 mg via INTRAVENOUS
  Administered 2021-07-08: 100 mg via INTRAVENOUS
  Administered 2021-07-08: 30 mg via INTRAVENOUS

## 2021-07-08 MED ORDER — HYDROMORPHONE HCL 1 MG/ML IJ SOLN
0.5000 mg | INTRAMUSCULAR | Status: DC | PRN
  Administered 2021-07-08: 1 mg via INTRAVENOUS
  Administered 2021-07-08: 0.5 mg via INTRAVENOUS
  Administered 2021-07-09: 1 mg via INTRAVENOUS
  Filled 2021-07-08 (×2): qty 1

## 2021-07-08 MED ORDER — BUPIVACAINE LIPOSOME 1.3 % IJ SUSP
INTRAMUSCULAR | Status: AC
  Filled 2021-07-08: qty 20

## 2021-07-08 MED ORDER — AMLODIPINE BESYLATE 5 MG PO TABS
5.0000 mg | ORAL_TABLET | Freq: Every day | ORAL | Status: DC
Start: 2021-07-09 — End: 2021-07-09
  Administered 2021-07-09: 5 mg via ORAL
  Filled 2021-07-08: qty 1

## 2021-07-08 MED ORDER — STERILE WATER FOR IRRIGATION IR SOLN
Status: DC | PRN
  Administered 2021-07-08: 1000 mL

## 2021-07-08 MED ORDER — HYDROCODONE-ACETAMINOPHEN 5-325 MG PO TABS
ORAL_TABLET | ORAL | Status: AC
  Filled 2021-07-08: qty 1

## 2021-07-08 MED ORDER — LIDOCAINE HCL (PF) 2 % IJ SOLN
INTRAMUSCULAR | Status: AC
  Filled 2021-07-08: qty 20

## 2021-07-08 MED ORDER — EPHEDRINE SULFATE (PRESSORS) 50 MG/ML IJ SOLN
INTRAMUSCULAR | Status: DC | PRN
  Administered 2021-07-08: 5 mg via INTRAVENOUS
  Administered 2021-07-08: 10 mg via INTRAVENOUS

## 2021-07-08 MED ORDER — PROPOFOL 10 MG/ML IV BOLUS
INTRAVENOUS | Status: DC | PRN
  Administered 2021-07-08: 150 mg via INTRAVENOUS

## 2021-07-08 MED ORDER — HYDROCODONE-ACETAMINOPHEN 5-325 MG PO TABS
1.0000 | ORAL_TABLET | ORAL | Status: DC | PRN
  Administered 2021-07-08 – 2021-07-09 (×2): 1 via ORAL
  Filled 2021-07-08 (×3): qty 1

## 2021-07-08 MED ORDER — LACTATED RINGERS IR SOLN
Status: DC | PRN
  Administered 2021-07-08: 1000 mL

## 2021-07-08 MED ORDER — CEFAZOLIN SODIUM-DEXTROSE 2-4 GM/100ML-% IV SOLN
2.0000 g | INTRAVENOUS | Status: AC
  Administered 2021-07-08: 2 g via INTRAVENOUS
  Filled 2021-07-08: qty 100

## 2021-07-08 MED ORDER — HYDROMORPHONE HCL 1 MG/ML IJ SOLN
INTRAMUSCULAR | Status: AC
  Filled 2021-07-08: qty 1

## 2021-07-08 MED ORDER — ONDANSETRON HCL 4 MG/2ML IJ SOLN
4.0000 mg | INTRAMUSCULAR | Status: DC | PRN
  Administered 2021-07-08 – 2021-07-09 (×5): 4 mg via INTRAVENOUS
  Filled 2021-07-08 (×5): qty 2

## 2021-07-08 MED ORDER — LIDOCAINE HCL (PF) 2 % IJ SOLN
INTRAMUSCULAR | Status: DC | PRN
  Administered 2021-07-08: 1.5 mg/kg/h via INTRADERMAL

## 2021-07-08 MED ORDER — HYOSCYAMINE SULFATE 0.125 MG SL SUBL: 0.1250 mg | SUBLINGUAL_TABLET | SUBLINGUAL | Status: DC | PRN

## 2021-07-08 MED ORDER — MIDAZOLAM HCL 2 MG/2ML IJ SOLN
INTRAMUSCULAR | Status: AC
  Filled 2021-07-08: qty 2

## 2021-07-08 MED ORDER — KETOROLAC TROMETHAMINE 0.5 % OP SOLN
1.0000 [drp] | Freq: Four times a day (QID) | OPHTHALMIC | Status: DC
  Administered 2021-07-08 – 2021-07-09 (×3): 1 [drp] via OPHTHALMIC
  Filled 2021-07-08: qty 5

## 2021-07-08 MED ORDER — ORAL CARE MOUTH RINSE: 15.0000 mL | Freq: Once | OROMUCOSAL | Status: AC

## 2021-07-08 MED ORDER — ONDANSETRON HCL 4 MG/2ML IJ SOLN
INTRAMUSCULAR | Status: AC
  Filled 2021-07-08: qty 2

## 2021-07-08 MED ORDER — SUGAMMADEX SODIUM 200 MG/2ML IV SOLN
INTRAVENOUS | Status: DC | PRN
  Administered 2021-07-08: 400 mg via INTRAVENOUS

## 2021-07-08 MED ORDER — OXYCODONE HCL 5 MG PO TABS: 5.0000 mg | ORAL_TABLET | Freq: Once | ORAL | Status: DC | PRN

## 2021-07-08 MED ORDER — FENTANYL CITRATE PF 50 MCG/ML IJ SOSY
25.0000 ug | PREFILLED_SYRINGE | INTRAMUSCULAR | Status: DC | PRN
  Administered 2021-07-08 (×2): 50 ug via INTRAVENOUS

## 2021-07-08 MED ORDER — DIPHENHYDRAMINE HCL 12.5 MG/5ML PO ELIX: 12.5000 mg | ORAL_SOLUTION | Freq: Four times a day (QID) | ORAL | Status: DC | PRN

## 2021-07-08 MED ORDER — LIDOCAINE HCL (CARDIAC) PF 100 MG/5ML IV SOSY
PREFILLED_SYRINGE | INTRAVENOUS | Status: DC | PRN
  Administered 2021-07-08: 100 mg via INTRAVENOUS

## 2021-07-08 MED ORDER — PHENYLEPHRINE HCL (PRESSORS) 10 MG/ML IV SOLN
INTRAVENOUS | Status: DC | PRN
  Administered 2021-07-08 (×2): 80 ug via INTRAVENOUS
  Administered 2021-07-08 (×2): 160 ug via INTRAVENOUS

## 2021-07-08 MED ORDER — DEXAMETHASONE SODIUM PHOSPHATE 10 MG/ML IJ SOLN
INTRAMUSCULAR | Status: DC | PRN
  Administered 2021-07-08: 10 mg via INTRAVENOUS

## 2021-07-08 MED ORDER — LACTATED RINGERS IV SOLN: INTRAVENOUS | Status: DC

## 2021-07-08 MED ORDER — DEXMEDETOMIDINE (PRECEDEX) IN NS 20 MCG/5ML (4 MCG/ML) IV SYRINGE
PREFILLED_SYRINGE | INTRAVENOUS | Status: AC
  Filled 2021-07-08: qty 5

## 2021-07-08 MED ORDER — AMISULPRIDE (ANTIEMETIC) 5 MG/2ML IV SOLN: 10.0000 mg | Freq: Once | INTRAVENOUS | Status: DC | PRN

## 2021-07-08 MED ORDER — LIDOCAINE HCL (PF) 2 % IJ SOLN
INTRAMUSCULAR | Status: AC
  Filled 2021-07-08: qty 5

## 2021-07-08 MED ORDER — CHLORHEXIDINE GLUCONATE 0.12 % MT SOLN
15.0000 mL | Freq: Once | OROMUCOSAL | Status: AC
  Administered 2021-07-08: 15 mL via OROMUCOSAL

## 2021-07-08 SURGICAL SUPPLY — 82 items
ADH SKN CLS APL DERMABOND .7 (GAUZE/BANDAGES/DRESSINGS) ×1
AGENT HMST KT MTR STRL THRMB (HEMOSTASIS) ×1
APL ESCP 34 STRL LF DISP (HEMOSTASIS) ×1
APL PRP STRL LF DISP 70% ISPRP (MISCELLANEOUS) ×1
APPLICATOR SURGIFLO ENDO (HEMOSTASIS) ×2 IMPLANT
BAG COUNTER SPONGE SURGICOUNT (BAG) IMPLANT
BAG RETRIEVAL 10 (BASKET) ×1
BAG SPNG CNTER NS LX DISP (BAG)
CHLORAPREP W/TINT 26 (MISCELLANEOUS) ×2 IMPLANT
CLIP LIGATING HEM O LOK PURPLE (MISCELLANEOUS) ×4 IMPLANT
CLIP LIGATING HEMO LOK XL GOLD (MISCELLANEOUS) IMPLANT
CLIP LIGATING HEMO O LOK GREEN (MISCELLANEOUS) ×3 IMPLANT
CLIP SUT LAPRA TY ABSORB (SUTURE) ×2 IMPLANT
COVER SURGICAL LIGHT HANDLE (MISCELLANEOUS) ×2 IMPLANT
COVER TIP SHEARS 8 DVNC (MISCELLANEOUS) ×1 IMPLANT
COVER TIP SHEARS 8MM DA VINCI (MISCELLANEOUS) ×2
CUTTER ECHEON FLEX ENDO 45 340 (ENDOMECHANICALS) IMPLANT
DERMABOND ADVANCED (GAUZE/BANDAGES/DRESSINGS) ×1
DERMABOND ADVANCED .7 DNX12 (GAUZE/BANDAGES/DRESSINGS) ×1 IMPLANT
DRAIN CHANNEL 15F RND FF 3/16 (WOUND CARE) ×2 IMPLANT
DRAPE ARM DVNC X/XI (DISPOSABLE) ×4 IMPLANT
DRAPE COLUMN DVNC XI (DISPOSABLE) ×1 IMPLANT
DRAPE DA VINCI XI ARM (DISPOSABLE) ×8
DRAPE DA VINCI XI COLUMN (DISPOSABLE) ×2
DRAPE INCISE IOBAN 66X45 STRL (DRAPES) ×2 IMPLANT
DRAPE SHEET LG 3/4 BI-LAMINATE (DRAPES) ×2 IMPLANT
DRSG TEGADERM 4X4.75 (GAUZE/BANDAGES/DRESSINGS) ×2 IMPLANT
ELECT PENCIL ROCKER SW 15FT (MISCELLANEOUS) ×2 IMPLANT
ELECT REM PT RETURN 15FT ADLT (MISCELLANEOUS) ×2 IMPLANT
EVACUATOR SILICONE 100CC (DRAIN) ×2 IMPLANT
GAUZE 4X4 16PLY ~~LOC~~+RFID DBL (SPONGE) ×2 IMPLANT
GAUZE SPONGE 2X2 8PLY STRL LF (GAUZE/BANDAGES/DRESSINGS) ×1 IMPLANT
GLOVE BIO SURGEON STRL SZ 6.5 (GLOVE) ×2 IMPLANT
GLOVE SURG LX 7.5 STRW (GLOVE) ×2
GLOVE SURG LX STRL 7.5 STRW (GLOVE) ×2 IMPLANT
GOWN SRG XL LVL 4 BRTHBL STRL (GOWNS) ×1 IMPLANT
GOWN STRL NON-REIN XL LVL4 (GOWNS) ×2
HEMOSTAT SURGICEL 4X8 (HEMOSTASIS) ×2 IMPLANT
HOLDER FOLEY CATH W/STRAP (MISCELLANEOUS) ×2 IMPLANT
IRRIG SUCT STRYKERFLOW 2 WTIP (MISCELLANEOUS) ×2
IRRIGATION SUCT STRKRFLW 2 WTP (MISCELLANEOUS) ×1 IMPLANT
KIT BASIN OR (CUSTOM PROCEDURE TRAY) ×2 IMPLANT
KIT TURNOVER KIT A (KITS) IMPLANT
LOOP VESSEL MAXI BLUE (MISCELLANEOUS) ×2 IMPLANT
MARKER SKIN DUAL TIP RULER LAB (MISCELLANEOUS) ×2 IMPLANT
NDL INSUFFLATION 14GA 120MM (NEEDLE) ×1 IMPLANT
NEEDLE HYPO 22GX1.5 SAFETY (NEEDLE) ×2 IMPLANT
NEEDLE INSUFFLATION 14GA 120MM (NEEDLE) ×2 IMPLANT
PORT ACCESS TROCAR AIRSEAL 12 (TROCAR) ×1 IMPLANT
PORT ACCESS TROCAR AIRSEAL 5M (TROCAR) ×1
PROTECTOR NERVE ULNAR (MISCELLANEOUS) ×4 IMPLANT
RELOAD STAPLE 45 2.6 WHT THIN (STAPLE) IMPLANT
SEAL CANN UNIV 5-8 DVNC XI (MISCELLANEOUS) ×4 IMPLANT
SEAL XI 5MM-8MM UNIVERSAL (MISCELLANEOUS) ×8
SET TRI-LUMEN FLTR TB AIRSEAL (TUBING) ×2 IMPLANT
SOLUTION ELECTROLUBE (MISCELLANEOUS) ×2 IMPLANT
SPIKE FLUID TRANSFER (MISCELLANEOUS) ×2 IMPLANT
SPONGE GAUZE 2X2 STER 10/PKG (GAUZE/BANDAGES/DRESSINGS) ×1
SPONGE T-LAP 4X18 ~~LOC~~+RFID (SPONGE) ×2 IMPLANT
STAPLE RELOAD 45 WHT (STAPLE) IMPLANT
STAPLE RELOAD 45MM WHITE (STAPLE)
SURGIFLO W/THROMBIN 8M KIT (HEMOSTASIS) ×2 IMPLANT
SUT ETHILON 3 0 PS 1 (SUTURE) ×2 IMPLANT
SUT MNCRL AB 4-0 PS2 18 (SUTURE) ×4 IMPLANT
SUT PDS AB 1 CT1 27 (SUTURE) ×4 IMPLANT
SUT V-LOC BARB 180 2/0GR6 GS22 (SUTURE) ×2
SUT VIC AB 0 CT1 27 (SUTURE) ×10
SUT VIC AB 0 CT1 27XBRD ANTBC (SUTURE) ×4 IMPLANT
SUT VIC AB 2-0 SH 27 (SUTURE) ×4
SUT VIC AB 2-0 SH 27X BRD (SUTURE) ×2 IMPLANT
SUT VLOC BARB 180 ABS3/0GR12 (SUTURE) ×2
SUTURE V-LC BRB 180 2/0GR6GS22 (SUTURE) IMPLANT
SUTURE VLOC BRB 180 ABS3/0GR12 (SUTURE) ×1 IMPLANT
SYS BAG RETRIEVAL 10MM (BASKET) ×1
SYSTEM BAG RETRIEVAL 10MM (BASKET) ×1 IMPLANT
TOWEL OR 17X26 10 PK STRL BLUE (TOWEL DISPOSABLE) ×2 IMPLANT
TOWEL OR NON WOVEN STRL DISP B (DISPOSABLE) ×2 IMPLANT
TRAY FOLEY MTR SLVR 16FR STAT (SET/KITS/TRAYS/PACK) ×2 IMPLANT
TRAY LAPAROSCOPIC (CUSTOM PROCEDURE TRAY) ×2 IMPLANT
TROCAR ADV FIXATION 12X100MM (TROCAR) ×2 IMPLANT
TROCAR Z-THREAD OPTICAL 5X100M (TROCAR) IMPLANT
WATER STERILE IRR 1000ML POUR (IV SOLUTION) ×4 IMPLANT

## 2021-07-08 NOTE — Anesthesia Procedure Notes (Signed)
Procedure Name: Intubation Date/Time: 07/08/2021 12:57 PM  Performed by: Deveion Denz, Forest Gleason, CRNAPre-anesthesia Checklist: Patient identified, Emergency Drugs available, Suction available, Patient being monitored and Timeout performed Patient Re-evaluated:Patient Re-evaluated prior to induction Oxygen Delivery Method: Circle system utilized Preoxygenation: Pre-oxygenation with 100% oxygen Induction Type: IV induction Ventilation: Mask ventilation without difficulty Laryngoscope Size: 4 Grade View: Grade III Tube type: Oral Tube size: 7.5 mm Number of attempts: 1 Airway Equipment and Method: Stylet Placement Confirmation: ETT inserted through vocal cords under direct vision, positive ETCO2, CO2 detector and breath sounds checked- equal and bilateral Secured at: 23 cm Tube secured with: Tape Dental Injury: Teeth and Oropharynx as per pre-operative assessment

## 2021-07-08 NOTE — Op Note (Signed)
Vincent Strickland, ROSATI MEDICAL RECORD NO: [id number] ACCOUNT NO: [id number] DATE OF BIRTH: 04/04/1965 FACILITY: [facility] LOCATION: [facility] PHYSICIAN: Sebastian Ache, MD  Operative Report   DATE OF PROCEDURE: 07/08/2021  PREOPERATIVE DIAGNOSIS:  Right lateral renal mass.  POSTOPERATIVE DIAGNOSIS:  Right lateral renal mass and right lower pole renal mass.  PROCEDURE: 1.  Right robotic partial nephrectomy. 2.  Intraoperative ultrasound interpretation.  ESTIMATED BLOOD LOSS:  20 mL  COMPLICATIONS:  None.  SPECIMENS:   1.  Right lateral renal mass for permanent pathology. 2.  Right lower pole renal mass for permanent pathology.  Warm ischemia time 22 minutes.  FINDINGS:   1.  Single artery, single vein, right renovascular anatomy. 2.  Predominantly exophytic solid right lateral renal mass. 3.  Additional lower pole solid and cystic component, renal mass.  ASSISTANT:  Harrie Foreman, PA  DRAINS: 1.  Jackson-Pratt drain bulb suction. 2.  Foley catheter to straight drain.  INDICATIONS:  The patient is a very pleasant 56 year old man with history of prior very aggressive colon cancer. He is status post surgery and radiation for this and has done amazingly well from a functional oncologic perspective, he is very compliant  with surveillance.  He was found incidentally on surveillance of his colon neoplasm to have a new solid enhancing right lateral renal mass concerning for primary renal cell carcinoma.  This was removed over several year period.  Initial imaging, which  did suggest  neoplasm.  Options were discussed for management including surveillance protocols versus ablative therapy versus surgical extirpation and he wished to proceed with partial nephrectomy with curative intent.  Informed consent was obtained  and placed in medical record.  PROCEDURE:  The patient is being identified, procedure being right robotic partial nephrectomy was confirmed.  Procedure timeout was performed.   Intravenous antibiotics were administered.  General endotracheal anesthesia induced.  The patient was placed  with the right side up full flank position, pulling 15 degrees of table flexion, superior arm elevator, axillary roll sequential compression devices, bottom leg bent, top leg straight.  Foley catheter was placed free to straight drain.  He was further  fastened to operating table using 3-inch tape over foam padding across supraxiphoid chest and his pelvis.  Sterile field was created by first clipper shaving and then prepping and draping the patient's right flank and abdomen using chlorhexidine  gluconate.  He does have a history of reaction to non CHG prep.  This was confirmed using prior notes.  Next, high flow, low pressure, pneumoperitoneum was obtained using Veress technique, right lower quadrant, having passed the aspiration and drop test.   An 8 mm robotic camera port was then placed in position approximately one and a half handbreadths superior lateral to the umbilicus.  Laparoscopic examination of peritoneal cavity revealed no significant adhesions, no visceral injury.  Additional ports  were placed as follows:  Right subcostal 8 mm robotic port, right far lateral 8 mm robotic port, approximately 4 fingerbreadths superomedial to the anterior superior iliac spine, right paramedian inferior robotic port, approximately 1 handbreadth  superior to the pubic ramus and two 12 mm assistant port sites to midline, one in the supraumbilical crease.  This was inferior to the camera port and one approximately 2 fingerbreadths superior to the planned camera port in the midline. Superior one  being AirSeal and following additional 5 mm port in the subxiphoid location through which a self-locking grasper was used to elevate the inferior border of the liver off of Gerota's fascia.  Robot was docked, having passed the electronic checks.   Attention was then directed at development of the retroperitoneum.   Incision was made lateral to the ascending colon near the cecum towards the area of the hepatic flexure and this colon was carefully swept medially.  The duodenum was encountered and  very carefully kocherized medially such that it lie medial to the lateral border of the inferior vena cava.  Lower pole kidney area was identified, placed on gentle lateral traction.  Dissection proceeded medial to this, the ureter and gonadal vessels  were encountered.  Gonadal vessels were allowed to fall medially.  Ureter was carefully swept laterally.  Dissection proceeded in the triangle of the ureter, psoas musculature and great vessels towards the renal hilum.  Renal hilum consisted of single  artery, single vein renal vascular anatomy as anticipated.  The artery was circumferentially mobilized marked a vessel loop.  Attention was directed to identification of the mass. Mass was noted on preoperative imaging to be slightly below the plane of  the hilar access.  Therefore, dissection was proceeded directly on to the anterior surface of the renal parenchyma for the lower half of the kidney and carefully exposed laterally. The dominant tumor was indeed noted.  It was predominantly exophytic  appearing and approximately the mid portion of the kidney slightly posterior.  It did appear amenable to simple partial nephrectomy  grossly and the gross margins were scored using cautery dissection.  During mobilization of this a second focus was noted at the extreme  lower pole that was highly concerning for additional focus of tumor.  This appeared to have a solid components to it and again was highly concerning for additional focus of tumor and was clearly felt that resection of this area was warranted as well.  Drop-in ultrasound probe was used to perform intraoperative ultrasound.  Intraoperative ultrasound did reveal predominantly exophytic solid right lateral renal mass.  There is a small cystic component that was inferior at  its deepest aspect, but did appear solid tissue surrounding it, smaller lesion was quite small and then  again appeared to be mix of cystic and solid components.  Warm ischemia was achieved by placing 2 bulldog clamps on the renal artery and expeditious careful partial nephrectomy was performed of the dominant lateral lesion appeared to be a of normal  renal parenchyma with the small tumor, which was then set aside.    Next, a first layer renorrhaphy was performed using running 3-0 V-Loc suture oversewing several small venous sinuses resulted in quite good hemostasis.  A small  bolster was applied and the second layer renorrhaphy was performed using 0 Vicryl sandwiched Hem-o-loks and Lapra-Tys thus providing parenchymal squeezing apposition over the bolster.  This was performed x2. While under warm ischemia, very careful and  expeditious partial nephrectomy was performed of the extreme lower pole mass as well and this was very small, less than 1 cm and immediately set aside and passed off as a lower pole  right mass. Base of this was fulgurated and 2 small parenchymal  apposition sutures were carefully applied. Bulldog clamps were released for a total warm ischemia time 22 minutes and both sides appeared to have quite excellent hemostasis, I was quite happy with the hemostasis.  Sponge and needle counts were correct.   Introitus fascia was reapproximated using running 2-0 V-Loc and this reapproximated the retroperitoneum.  The vessel loop was removed.  The small renal tumor was placed in EndoCatch bag for retrieval.  A closed suction drain was brought out the previous  lateral most robotic port site into the peritoneal cavity.  The retractor was taken down.  There was no evidence of visceral injury. Robot was then undocked.  The superior most assistant port site was closed with fascia using Carter-Thomason suture  passer and 0 Vicryl.  The inferior most site was extended superiorly for a total distance of  approximately 2.5 cm, removing the partial nephrectomy specimen setting aside for permanent pathology.  Extraction site was closed with fascia using  figure-of-eight PDS x2, followed by reapproximation of Scarpa's with running Vicryl.  All incision sites were infiltrated with dilute lipolyzed Marcaine and closed at the level of the skin using subcuticular Monocryl followed by Dermabond.  Procedure was  terminated.  The patient tolerated the procedure well, no immediate perioperative complications.  The patient was taken to postanesthesia care unit in stable condition.  Plan for observation admission.  Please note, first assistant, Harrie Foreman, was crucial for all portions of the surgery today.  She provided invaluable retraction, suctioning, vascular clamping and general first assistance.   PUS D: 07/08/2021 3:45:36 pm T: 07/08/2021 5:13:00 pm  JOB: [id number]/ [id number]

## 2021-07-08 NOTE — Transfer of Care (Signed)
Immediate Anesthesia Transfer of Care Note  Patient: Vincent Strickland  Procedure(s) Performed: XI ROBOTIC ASSITED PARTIAL NEPHRECTOMY, INTRAOPERATIVE ULTRASOUND (Right: Abdomen)  Patient Location: PACU  Anesthesia Type:General  Level of Consciousness: awake, alert , oriented and patient cooperative  Airway & Oxygen Therapy: Patient Spontanous Breathing and Patient connected to face mask oxygen  Post-op Assessment: Report given to RN, Post -op Vital signs reviewed and stable and Patient moving all extremities X 4  Post vital signs: Reviewed and stable  Last Vitals:  Vitals Value Taken Time  BP    Temp    Pulse 71 07/08/21 1551  Resp 16 07/08/21 1551  SpO2 95 % 07/08/21 1551  Vitals shown include unvalidated device data.  Last Pain:  Vitals:   07/08/21 1026  TempSrc:   PainSc: 0-No pain         Complications: No notable events documented.

## 2021-07-08 NOTE — Discharge Instructions (Signed)

## 2021-07-08 NOTE — Brief Op Note (Signed)
07/08/2021  3:33 PM  PATIENT:  Vincent Strickland  56 y.o. male  PRE-OPERATIVE DIAGNOSIS:  RIGHT RENAL MASS  POST-OPERATIVE DIAGNOSIS:  RIGHT RENAL MASS  PROCEDURE:  Procedure(s) with comments: XI ROBOTIC ASSITED PARTIAL NEPHRECTOMY, INTRAOPERATIVE ULTRASOUND (Right) - 3 HRS  SURGEON:  Surgeon(s) and Role:    Alexis Frock, MD - Primary  PHYSICIAN ASSISTANT:   ASSISTANTS: Debbrah Alar PA   ANESTHESIA:   local and general  EBL:  20 mL   BLOOD ADMINISTERED:none  DRAINS:  JP to bulb; Foley to gravity    LOCAL MEDICATIONS USED:  MARCAINE     SPECIMEN:  Source of Specimen:  1 - Rt lateral renal mass ; 2 - Rt inferior renal mass  DISPOSITION OF SPECIMEN:  PATHOLOGY  COUNTS:  YES  TOURNIQUET:  * No tourniquets in log *  DICTATION: .Other Dictation: Dictation Number [id number]  PLAN OF CARE: Admit for overnight observation  PATIENT DISPOSITION:  PACU - hemodynamically stable.   Delay start of Pharmacological VTE agent (>24hrs) due to surgical blood loss or risk of bleeding: yes

## 2021-07-08 NOTE — Anesthesia Preprocedure Evaluation (Signed)
Anesthesia Evaluation  Patient identified by MRN, date of birth, ID band Patient awake    Reviewed: Allergy & Precautions, NPO status , Patient's Chart, lab work & pertinent test results  History of Anesthesia Complications Negative for: history of anesthetic complications  Airway Mallampati: III  TM Distance: >3 FB Neck ROM: Full    Dental  (+) Teeth Intact, Dental Advisory Given   Pulmonary neg pulmonary ROS,    Pulmonary exam normal        Cardiovascular hypertension, Pt. on medications Normal cardiovascular exam     Neuro/Psych negative neurological ROS     GI/Hepatic negative GI ROS, Neg liver ROS, H/o colon cancer s/p XRT   Endo/Other  negative endocrine ROS  Renal/GU Renal disease (renal mass)  negative genitourinary   Musculoskeletal negative musculoskeletal ROS (+)   Abdominal   Peds  Hematology negative hematology ROS (+)   Anesthesia Other Findings   Reproductive/Obstetrics                           Anesthesia Physical Anesthesia Plan  ASA: 2  Anesthesia Plan: General   Post-op Pain Management: Tylenol PO (pre-op)* and Lidocaine infusion*   Induction: Intravenous  PONV Risk Score and Plan: 2 and Ondansetron, Dexamethasone, Treatment may vary due to age or medical condition and Midazolam  Airway Management Planned: Oral ETT  Additional Equipment: None  Intra-op Plan:   Post-operative Plan: Extubation in OR  Informed Consent: I have reviewed the patients History and Physical, chart, labs and discussed the procedure including the risks, benefits and alternatives for the proposed anesthesia with the patient or authorized representative who has indicated his/her understanding and acceptance.     Dental advisory given  Plan Discussed with:   Anesthesia Plan Comments:         Anesthesia Quick Evaluation

## 2021-07-08 NOTE — Anesthesia Postprocedure Evaluation (Signed)
Anesthesia Post Note  Patient: Vincent Strickland  Procedure(s) Performed: XI ROBOTIC ASSITED PARTIAL NEPHRECTOMY, INTRAOPERATIVE ULTRASOUND (Right: Abdomen)     Patient location during evaluation: PACU Anesthesia Type: General Level of consciousness: awake and alert Pain management: pain level controlled Vital Signs Assessment: post-procedure vital signs reviewed and stable Respiratory status: spontaneous breathing, nonlabored ventilation and respiratory function stable Cardiovascular status: blood pressure returned to baseline and stable Postop Assessment: no apparent nausea or vomiting Anesthetic complications: no   No notable events documented.  Last Vitals:  Vitals:   07/08/21 1630 07/08/21 1700  BP: (!) 143/75 (!) 153/100  Pulse: 62 76  Resp: 12 11  Temp: 36.8 C   SpO2: 98% 97%    Last Pain:  Vitals:   07/08/21 1700  TempSrc:   PainSc: 6                  Lidia Collum

## 2021-07-08 NOTE — H&P (Signed)
Vincent Strickland is an 56 y.o. male.    Chief Complaint: Pre-OP RIGHT partial nephrectomy  HPI:   1 - Small Renal Mass - 2cm Rt solid, enhancing renal mass on CT, then dedicated MRI 2023, new since CT 2021. Mass is solitary, 80% exophytic, Rt lower-mid lateral. 1 artery / 1 vein right renovascular anatomy.    PMH sig for obesity, metastatic rectal cancer (s/p surgery + chemo+ XRT 2017, now surveillance withotu recurrence, follows [facility] med-onc). He works in Engineer, technical sales for [employer] (regional, [employer] in [place], works from home mostly)   Today "Vincent Strickland" is seen to proceed with RIGHT robotic partial nephrecotmy. NO interval fevers. Hgb 16, Cr 0.9.   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 ([place]) 01/07/2016   Headache    occas migraines   Hemorrhoid    High cholesterol    History of bladder infections    History of colon cancer, stage III 01/07/2016   History of kidney stones    History of radiation therapy 05/18/16-06/25/16   rectum 45 Gy in 25 fraction, rectom boost 5.4 Gy in 3 fractions   Hypertension    Hypogonadism male    Renal disorder    kidney stones   Vertigo, benign positional     Past Surgical History:  Procedure Laterality Date   COLONOSCOPY     Dr Paulita Fujita around 12/2016-1/20219   COLONOSCOPY WITH PROPOFOL Left 12/31/2015   Procedure: COLONOSCOPY WITH PROPOFOL;  Surgeon: Arta Silence, MD;  Location: [facility];  Service: Endoscopy;  Laterality: Left;   LAPAROSCOPIC SIGMOID COLECTOMY N/A 01/01/2016   Procedure: LAPAROSCOPIC ASSISTED SIGMOID COLECTOMY;  Surgeon: Johnathan Hausen, MD;  Location: [facility];  Service: General;  Laterality: N/A;   LITHOTRIPSY     PORT-A-CATH REMOVAL N/A 11/26/2016   Procedure: REMOVAL PORT-A-CATH;  Surgeon: Johnathan Hausen, MD;  Location: [place];  Service: General;  Laterality: N/A;   PORTACATH PLACEMENT N/A 01/27/2016   Procedure: INSERTION PORT-A-CATH;   Surgeon: Johnathan Hausen, MD;  Location: [facility];  Service: General;  Laterality: N/A;   TONSILLECTOMY     UPPER GASTROINTESTINAL ENDOSCOPY      Family History  Problem Relation Age of Onset   Hypertension Mother    Cancer Mother        ovarian tumor   Cancer Father        prostate   Migraines Father    Esophageal cancer Maternal Grandmother    Hypertension Maternal Grandmother    Cancer Maternal Grandmother        esophageal   Colon cancer Maternal Grandfather    Cancer Maternal Grandfather        suspect colon   Diabetes Paternal Grandfather    COPD Paternal Grandfather    Rectal cancer Neg Hx    Stomach cancer Neg Hx    Social History:  reports that he has never smoked. He has never used smokeless tobacco. He reports that he does not drink alcohol and does not use drugs.  Allergies:  Allergies  Allergen Reactions   Niaspan [Niacin] Other (See Comments)    Severe flushing   Other Itching and Other (See Comments)    After port placement pt had severe skin reaction to SKIN CLEANSER in armpits and neck area "could see brush red brush marks, itched, burned and skin peeled off , severe burn"  Possibly Chlorhexidine    Percocet [Oxycodone-Acetaminophen] Nausea Only and Other (See Comments)  hallucinations    No medications prior to admission.    No results found for this or any previous visit (from the past 48 hour(s)). No results found.  Review of Systems  Constitutional:  Negative for chills and fever.  All other systems reviewed and are negative.   There were no vitals taken for this visit. Physical Exam Vitals reviewed.  HENT:     Head: Normocephalic.     Mouth/Throat:     Mouth: Mucous membranes are moist.  Eyes:     Pupils: Pupils are equal, round, and reactive to light.  Cardiovascular:     Rate and Rhythm: Normal rate.  Abdominal:     General: Abdomen is flat.     Comments: Prior scars w/o henias.   Musculoskeletal:        General: Normal range of  motion.     Cervical back: Normal range of motion.  Neurological:     General: No focal deficit present.     Mental Status: He is alert.  Psychiatric:        Mood and Affect: Mood normal.      Assessment/Plan  Proceed as planned with RIGHT robotic partial nephrectomy. Risks, benefits, alternatives, expected peri-op course discussed previously and reiterated today.   Alexis Frock, MD 07/08/2021, 7:16 AM

## 2021-07-09 ENCOUNTER — Encounter (HOSPITAL_COMMUNITY): Payer: Self-pay | Admitting: Urology

## 2021-07-09 DIAGNOSIS — C641 Malignant neoplasm of right kidney, except renal pelvis: Secondary | ICD-10-CM | POA: Diagnosis not present

## 2021-07-09 DIAGNOSIS — Z85038 Personal history of other malignant neoplasm of large intestine: Secondary | ICD-10-CM | POA: Diagnosis not present

## 2021-07-09 DIAGNOSIS — Z8579 Personal history of other malignant neoplasms of lymphoid, hematopoietic and related tissues: Secondary | ICD-10-CM | POA: Diagnosis not present

## 2021-07-09 DIAGNOSIS — N281 Cyst of kidney, acquired: Secondary | ICD-10-CM | POA: Diagnosis not present

## 2021-07-09 DIAGNOSIS — N2889 Other specified disorders of kidney and ureter: Secondary | ICD-10-CM | POA: Diagnosis not present

## 2021-07-09 DIAGNOSIS — I10 Essential (primary) hypertension: Secondary | ICD-10-CM | POA: Diagnosis not present

## 2021-07-09 LAB — BASIC METABOLIC PANEL
Anion gap: 9 (ref 5–15)
BUN: 16 mg/dL (ref 6–20)
CO2: 28 mmol/L (ref 22–32)
Calcium: 8.5 mg/dL — ABNORMAL LOW (ref 8.9–10.3)
Chloride: 104 mmol/L (ref 98–111)
Creatinine, Ser: 1.03 mg/dL (ref 0.61–1.24)
GFR, Estimated: >60 mL/min (ref >60)
Glucose, Bld: 141 mg/dL — ABNORMAL HIGH (ref 70–99)
Potassium: 3.2 mmol/L — ABNORMAL LOW (ref 3.5–5.1)
Sodium: 141 mmol/L (ref 135–145)

## 2021-07-09 LAB — HEMOGLOBIN AND HEMATOCRIT, BLOOD
HCT: 44.4 % (ref 39.0–52.0)
Hemoglobin: 16.1 g/dL (ref 13.0–17.0)

## 2021-07-09 MED ORDER — ORAL CARE MOUTH RINSE: 15.0000 mL | OROMUCOSAL | Status: DC | PRN

## 2021-07-09 MED ORDER — OXYCODONE HCL 5 MG PO TABS
5.0000 mg | ORAL_TABLET | ORAL | Status: DC | PRN
  Administered 2021-07-09: 5 mg via ORAL
  Filled 2021-07-09: qty 1

## 2021-07-09 NOTE — TOC Transition Note (Signed)
Transition of Care [facility]) - CM/SW Discharge Note   Patient Details  Name: Vincent Strickland MRN: [id number] Date of Birth: May 27, 1965  [facility]) CM/SW Contact:  Leeroy Cha, RN Phone Number: 07/09/2021, 3:09 PM   Clinical Narrative:    Dcd to home with self care, no toc needs.   Final next level of care: Home/Self Care Barriers to Discharge: Barriers Resolved   Patient Goals and CMS Choice Patient states their goals for this hospitalization and ongoing recovery are:: to go home CMS Medicare.gov Compare Post Acute Care list provided to:: Patient    Discharge Placement                       Discharge Plan and Services   Discharge Planning Services: CM Consult                                 Social Determinants of Health (SDOH) Interventions     Readmission Risk Interventions     No data to display

## 2021-07-09 NOTE — Discharge Summary (Cosign Needed)
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 Briefly, Vincent Strickland is a 56 y.o. year old patient with a right renal mass who presented for right robotic partial nephrectomy.   Hospital Course: Patient tolerated the procedure well.  He was then transferred to the floor after an uneventful PACU stay.  His hospital course was uncomplicated.  He initially had severe nausea post operatively, this completely resolved by the afternoon of POD1 and he was able to tolerated a diet without nausea or vomiting. His catheter was removed and he passed a TOV. His JP output was minimal so this was removed prior to discharge. On POD#1 he had met discharge criteria: was eating a regular diet, was up and ambulating independently,  pain was well controlled, was voiding without a catheter, and was ready to for discharge.  He has follow up arranged for 7/3 with Dr. Tresa Moore.   Laboratory values:  Recent Labs    07/08/21 1615 07/09/21 0528  HGB 16.4 16.1  HCT 45.3 44.4   Recent Labs    07/09/21 0528  NA 141  K 3.2*  CL 104  CO2 28  GLUCOSE 141*  BUN 16  CREATININE 1.03  CALCIUM 8.5*   No results for input(s): "LABPT", "INR" in the last 72 hours. No results for input(s): "LABURIN" in the last 72  hours. Results for orders placed or performed during the hospital encounter of 01/20/16  Surgical pcr screen     Status: None   Collection Time: 01/20/16  9:48 AM   Specimen: Nasal Mucosa; Nasal Swab  Result Value Ref Range Status   MRSA, PCR NEGATIVE NEGATIVE Final   Staphylococcus aureus NEGATIVE NEGATIVE Final    Comment:        The Xpert SA Assay (FDA approved for NASAL specimens in patients over 66 years of age), is one component of a comprehensive surveillance program.  Test performance has been validated by [facility] for patients greater than or equal to 44 year old. It is not intended to diagnose infection nor to guide or monitor treatment.     Disposition: Home  Discharge instruction: The patient was instructed to be ambulatory but told to refrain from heavy lifting, strenuous activity, or driving.   Discharge medications:  Allergies as of 07/09/2021       Reactions   Niaspan [niacin] Other (See Comments)   Severe flushing   Other Itching, Other (See Comments)   After port placement pt had severe skin reaction to SKIN CLEANSER in armpits and neck area "could see brush red brush marks, itched, burned and skin peeled off , severe burn" Possibly Chlorhexidine    Percocet [oxycodone-acetaminophen] Nausea Only, Other (See Comments)   hallucinations        Medication List     TAKE these medications  amLODipine 5 MG tablet Commonly known as: NORVASC Take 5 mg by mouth daily.   docusate sodium 100 MG capsule Commonly known as: COLACE Take 1 capsule (100 mg total) by mouth 2 (two) times daily.   gabapentin 300 MG capsule Commonly known as: NEURONTIN TAKE ONE CAP IN THE MORNING AND  2 CAPSULES  EVERY DAY AT BEDTIME.   irbesartan-hydrochlorothiazide 300-12.5 MG tablet Commonly known as: AVALIDE Take 1 tablet by mouth daily.   ketorolac 0.5 % ophthalmic solution Commonly known as: ACULAR Place 1 drop into the right eye 4 (four) times daily.    METAMUCIL FIBER PO Take 1 capsule by mouth daily.   pantoprazole 40 MG tablet Commonly known as: PROTONIX Take 1 tablet (40 mg total) by mouth 2 (two) times daily. Best to take on an empty stomach 20-30 before food.  Start Protonix 40 mg po BID x 8 weeks, then reduce to 40 mg daily and continue to titrate off if no further need for long-term acid suppression therapy.   potassium chloride SA 20 MEQ tablet Commonly known as: KLOR-CON M TAKE 2 TABLETS BY MOUTH TWICE DAILY   PROBIOTIC PO Take 1 capsule by mouth daily.        Followup:   Follow-up Information     Alexis Frock, MD Follow up on 07/27/2021.   Specialty: Urology Why: at 9:45 for MD visit and pathology reveiw. Contact information: [place] [address] [phone]

## 2021-07-09 NOTE — Progress Notes (Signed)
Patient discharged home with wife.  IVs and JP drain removed - WNL.  Reviewed AVS and medications - patient verbalizes understanding, no questions at this time.  Encouraged frequent ambulation and fluid intake.  Patient assisted off unit via WC in NAD

## 2021-07-09 NOTE — TOC Initial Note (Signed)
Transition of Care [facility]) - Initial/Assessment Note    Patient Details  Name: Estanislao Harmon MRN: [id number] Date of Birth: 03-27-1965  Transition of Care [facility]) CM/SW Contact:    Leeroy Cha, RN Phone Number: 07/09/2021, 7:51 AM  Clinical Narrative:                   Transition of Care [facility]) Screening Note   Patient Details  Name: Fadil Macmaster Date of Birth: 08/02/1965   Transition of Care [facility]) CM/SW Contact:    Leeroy Cha, RN Phone Number: 07/09/2021, 7:51 AM    Transition of Care Department [facility]) has reviewed patient and no TOC needs have been identified at this time. We will continue to monitor patient advancement through interdisciplinary progression rounds. If new patient transition needs arise, please place a TOC consult.   Expected Discharge Plan: Home/Self Care Barriers to Discharge: Continued Medical Work up   Patient Goals and CMS Choice Patient states their goals for this hospitalization and ongoing recovery are:: to go home CMS Medicare.gov Compare Post Acute Care list provided to:: Patient    Expected Discharge Plan and Services Expected Discharge Plan: Home/Self Care   Discharge Planning Services: CM Consult   Living arrangements for the past 2 months: Single Family Home                                      Prior Living Arrangements/Services Living arrangements for the past 2 months: Single Family Home Lives with:: Spouse Patient language and need for interpreter reviewed:: Yes Do you feel safe going back to the place where you live?: Yes      Need for Family Participation in Patient Care: Yes (Comment) Care giver support system in place?: Yes (comment)   Criminal Activity/Legal Involvement Pertinent to Current Situation/Hospitalization: No - Comment as needed  Activities of Daily Living Home Assistive Devices/Equipment: None ADL Screening (condition at time of admission) Patient's cognitive ability adequate to safely  complete daily activities?: No Is the patient deaf or have difficulty hearing?: No Does the patient have difficulty seeing, even when wearing glasses/contacts?: No Does the patient have difficulty concentrating, remembering, or making decisions?: No Patient able to express need for assistance with ADLs?: No Does the patient have difficulty dressing or bathing?: No Independently performs ADLs?: Yes (appropriate for developmental age) Does the patient have difficulty walking or climbing stairs?: No Weakness of Legs: None Weakness of Arms/Hands: None  Permission Sought/Granted                  Emotional Assessment Appearance:: Appears stated age Attitude/Demeanor/Rapport: Engaged Affect (typically observed): Calm Orientation: : Oriented to Place, Oriented to Self, Oriented to  Time, Oriented to Situation Alcohol / Substance Use: Not Applicable Psych Involvement: No (comment)  Admission diagnosis:  Renal mass [N28.89] Patient Active Problem List   Diagnosis Date Noted   Renal mass 07/08/2021   History of colon cancer, stage III 01/07/2016   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

## 2021-07-10 LAB — SURGICAL PATHOLOGY

## 2021-07-17 DIAGNOSIS — H43823 Vitreomacular adhesion, bilateral: Secondary | ICD-10-CM | POA: Diagnosis not present

## 2021-07-17 DIAGNOSIS — H35033 Hypertensive retinopathy, bilateral: Secondary | ICD-10-CM | POA: Diagnosis not present

## 2021-07-17 DIAGNOSIS — H2513 Age-related nuclear cataract, bilateral: Secondary | ICD-10-CM | POA: Diagnosis not present

## 2021-07-17 DIAGNOSIS — H35711 Central serous chorioretinopathy, right eye: Secondary | ICD-10-CM | POA: Diagnosis not present

## 2021-07-24 NOTE — Progress Notes (Signed)
Histology and Location of Primary Cancer: ***  Location(s) of Symptomatic tumor(s): ***  Past/Anticipated chemotherapy by medical oncology, if any: ***  Patient's main complaints related to symptomatic tumor(s) are: ***  Pain on a scale of 0-10 is: ***    If Spine Met(s), symptoms, if any, include: Bowel/Bladder retention or incontinence (please describe): *** Numbness or weakness in extremities (please describe): *** Current Decadron regimen, if applicable: ***  Ambulatory status? Walker? Wheelchair?: ***  SAFETY ISSUES: Prior radiation? *** Pacemaker/ICD? *** Possible current pregnancy? *** Is the patient on methotrexate? ***  Additional Complaints / other details:  ***

## 2021-07-25 NOTE — Progress Notes (Signed)
Radiation Oncology         (336) [phone] ________________________________  Outpatient Consultation  Name: Vincent Strickland MRN: [id number]  Date: 07/27/2021  DOB: 1965/07/08  HW:TUUE, Edwyna Shell, MD  [name], MD   REFERRING PHYSICIAN: [name], MD  DIAGNOSIS: There were no encounter diagnoses.  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 male who is accompanied by ***. he is seen as a courtesy of Dr. Marin Olp for an opinion concerning radiation therapy as part of management for his recently diagnosed right renal cancer.  The patient is known to me for his history of rectal cancer; s/p sigmoidectomy, chemotherapy, and radiation with Xeloda in 2018. I last met with the patient on 07/29/2016 for a 1 month post RT follow up. The patient then completed chemotherapy treatment under the care of Dr. Marin Olp in September of 2018. Since then, he has maintained routine follow-up visits with Dr. Marin Olp every 6 months, and with [facility]. The patient has had no evidence of rectal disease recurrence or metastatic disease in the following years. Pertinent history regarding his recent diagnosis of renal cancer is detailed as follows.   CT of the abdomen and pelvis performed on 09/29/2016 initially showed mild bilateral nonenhancing hypodense lesions in the bilateral kidneys, left greater than right, noted as likely representing cysts. These remained stable on follow up CT's performed on 12/27/2016, 05/09/2017, 11/04/2017, and 05/03/2018.   CT of the chest abdomen and pelvis on 10/27/2018 showed an increase in size of the subcentimeter intermediate density lesion in the interpolar right kidney measuring 1.8 cm, previously 1 cm. MRI of the abdomen with and without  contrast on 11/07/2018 further detailed the right interpolar renal lesion as likely reflective of a hemorrhagic proteinaceous cyst. An additional simple appearing left renal cyst was also appreciated. The right kidney lesion remained stable in size on CT's dated 05/03/2019 and 10/31/2019.  CT of the chest abdomen and pelvis on 05/08/20 showed a decrease in size of the of the previously characterized hemorrhagic/proteinaceous interpolar right renal cyst, measuring 1 cm, previously 1.8 cm.   CT of the chest abdomen and pelvis on 11/13/20 showed a slight increase in size of hyperdense lesion in the right kidney, measuring 1.4 cm (again favored to reflect a hemorrhagic/proteinaceous cyst).  CT of the chest abdomen and pelvis on 05/22/21 showed a notable increase in size of the right renal lesion, measuring 2 cm, compatible with a primary renal neoplasm.  CT otherwise showed no evidence of metastatic disease in the chest, abdomen and pelvis.   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 No regional adenopathy or findings of tumor thrombus in the right renal vein was appreciated. MRI also showed additional small benign hepatic and renal cysts. Other findings of potential clinical significance noted on MRI included soft tissue fullness and enhancement along the upper margin of the proximal stomach body, differentials such as a mass, gastric polyp, or infolding of the upper gastric wall can not be excluded in regards to this finding.   The patient was accordingly referred to urology and opted to proceed with a right partial nephrectomy on 07/08/21 under the care  of Dr. Tresa Moore. Pathology from the procedure (right lateral tumor excision) revealed grade 2 clear cell renal cell carcinoma with cystic changes measuring 2.1 cm in the greatest tumor  dimension. All margins negative. The right inferior tumor was also excised revealing findings consistent with a simple cyst with adjacent hyalinized stroma and focal lymphocytes  infiltration   Of note: for abnormal GI findings (possible stomach masses) noted on recent MRI, the patient underwent biopsies of the stomach on 06/16/21. Pathology revealed atypical lymphoid infiltrate from biopsies of the 2 possible masses, and patchy lymphoid infiltrate from biopsy of the stomach body. Additional biopsies of the stomach performed on 06/23/21 again showed atypical lymphoid infiltrate. 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 Pylori). 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 ([place]) 01/07/2016   Headache    occas migraines   Hemorrhoid    High cholesterol    History of bladder infections    History of colon cancer, stage III 01/07/2016   History of kidney stones    History of radiation therapy 05/18/16-06/25/16   rectum 45 Gy in 25 fraction, rectom boost 5.4 Gy in 3 fractions   Hypertension    Hypogonadism male    Renal disorder    kidney stones   Vertigo, benign positional     PAST SURGICAL HISTORY: Past Surgical History:  Procedure Laterality Date   COLONOSCOPY     Dr Paulita Fujita around 12/2016-1/20219   COLONOSCOPY WITH  PROPOFOL Left 12/31/2015   Procedure: COLONOSCOPY WITH PROPOFOL;  Surgeon: Arta Silence, MD;  Location: [facility];  Service: Endoscopy;  Laterality: Left;   LAPAROSCOPIC SIGMOID COLECTOMY N/A 01/01/2016   Procedure: LAPAROSCOPIC ASSISTED SIGMOID COLECTOMY;  Surgeon: Johnathan Hausen, MD;  Location: [facility];  Service: General;  Laterality: N/A;   LITHOTRIPSY     PORT-A-CATH REMOVAL N/A 11/26/2016   Procedure: REMOVAL PORT-A-CATH;  Surgeon: Johnathan Hausen, MD;  Location: [place];  Service: General;  Laterality: N/A;   PORTACATH PLACEMENT N/A 01/27/2016   Procedure: INSERTION PORT-A-CATH;  Surgeon: Johnathan Hausen, MD;  Location: [facility];  Service: General;  Laterality: N/A;   ROBOTIC ASSITED PARTIAL NEPHRECTOMY Right 07/08/2021   Procedure: XI ROBOTIC ASSITED PARTIAL NEPHRECTOMY, INTRAOPERATIVE ULTRASOUND;  Surgeon: Alexis Frock, MD;  Location: [facility];  Service: Urology;  Laterality: Right;  3 HRS   TONSILLECTOMY     UPPER GASTROINTESTINAL ENDOSCOPY      FAMILY HISTORY:  Family History  Problem Relation Age of Onset   Hypertension Mother  Cancer Mother        ovarian tumor   Cancer Father        prostate   Migraines Father    Esophageal cancer Maternal Grandmother    Hypertension Maternal Grandmother    Cancer Maternal Grandmother        esophageal   Colon cancer Maternal Grandfather    Cancer Maternal Grandfather        suspect colon   Diabetes Paternal Grandfather    COPD Paternal Grandfather    Rectal cancer Neg Hx    Stomach cancer Neg Hx     SOCIAL HISTORY:  Social History   Tobacco Use   Smoking status: Never   Smokeless tobacco: Never  Vaping Use   Vaping Use: Never used  Substance Use Topics   Alcohol use: Never   Drug use: Never    ALLERGIES:  Allergies  Allergen Reactions   Niaspan [Niacin] Other (See Comments)    Severe flushing   Other Itching and Other (See Comments)    After port placement pt had severe skin reaction to SKIN  CLEANSER in armpits and neck area "could see brush red brush marks, itched, burned and skin peeled off , severe burn"  Possibly Chlorhexidine    Percocet [Oxycodone-Acetaminophen] Nausea Only and Other (See Comments)    hallucinations    MEDICATIONS:  Current Outpatient Medications  Medication Sig Dispense Refill   amLODipine (NORVASC) 5 MG tablet Take 5 mg by mouth daily.     docusate sodium (COLACE) 100 MG capsule Take 1 capsule (100 mg total) by mouth 2 (two) times daily.     gabapentin (NEURONTIN) 300 MG capsule TAKE ONE CAP IN THE MORNING AND  2 CAPSULES  EVERY DAY AT BEDTIME. 90 capsule 4   irbesartan-hydrochlorothiazide (AVALIDE) 300-12.5 MG tablet Take 1 tablet by mouth daily.     ketorolac (ACULAR) 0.5 % ophthalmic solution Place 1 drop into the right eye 4 (four) times daily.     METAMUCIL FIBER PO Take 1 capsule by mouth daily.     pantoprazole (PROTONIX) 40 MG tablet Take 1 tablet (40 mg total) by mouth 2 (two) times daily. Best to take on an empty stomach 20-30 before food.  Start Protonix 40 mg po BID x 8 weeks, then reduce to 40 mg daily and continue to titrate off if no further need for long-term acid suppression therapy. 180 tablet 1   potassium chloride SA (KLOR-CON M) 20 MEQ tablet TAKE 2 TABLETS BY MOUTH TWICE DAILY 120 tablet 1   Probiotic Product (PROBIOTIC PO) Take 1 capsule by mouth daily.     Current Facility-Administered Medications  Medication Dose Route Frequency Provider Last Rate Last Admin   0.9 %  sodium chloride infusion  500 mL Intravenous Continuous Cirigliano, Vito V, DO        REVIEW OF SYSTEMS:  A 10+ POINT REVIEW OF SYSTEMS WAS OBTAINED including neurology, dermatology, psychiatry, cardiac, respiratory, lymph, extremities, GI, GU, musculoskeletal, constitutional, reproductive, HEENT. ***   PHYSICAL EXAM:  vitals were not taken for this visit.   General: Alert and oriented, in no acute distress HEENT: Head is normocephalic. Extraocular movements are  intact. Oropharynx is clear. Neck: Neck is supple, no palpable cervical or supraclavicular lymphadenopathy. Heart: Regular in rate and rhythm with no murmurs, rubs, or gallops. Chest: Clear to auscultation bilaterally, with no rhonchi, wheezes, or rales. Abdomen: Soft, nontender, nondistended, with no rigidity or guarding. Extremities: No cyanosis or edema. Lymphatics: see Neck Exam Skin:  No concerning lesions. Musculoskeletal: symmetric strength and muscle tone throughout. Neurologic: Cranial nerves II through XII are grossly intact. No obvious focalities. Speech is fluent. Coordination is intact. Psychiatric: Judgment and insight are intact. Affect is appropriate. ***  ECOG = ***  0 - Asymptomatic (Fully active, able to carry on all predisease activities without restriction)  1 - Symptomatic but completely ambulatory (Restricted in physically strenuous activity but ambulatory and able to carry out work of a light or sedentary nature. For example, light housework, office work)  2 - Symptomatic, <50% in bed during the day (Ambulatory and capable of all self care but unable to carry out any work activities. Up and about more than 50% of waking hours)  3 - Symptomatic, >50% in bed, but not bedbound (Capable of only limited self-care, confined to bed or chair 50% or more of waking hours)  4 - Bedbound (Completely disabled. Cannot carry on any self-care. Totally confined to bed or chair)  5 - Death   Eustace Pen MM, Creech RH, Tormey DC, et al. [phone]). "Toxicity and response criteria of the [facility]". [place] Oncol. 5 (6): 649-55  LABORATORY DATA:  Lab Results  Component Value Date   WBC 5.3 07/01/2021   HGB 16.1 07/09/2021   HCT 44.4 07/09/2021   MCV 88.1 07/01/2021   PLT 247 07/01/2021   NEUTROABS 2.7 05/13/2021   Lab Results  Component Value Date   NA 141 07/09/2021   K 3.2 (L) 07/09/2021   CL 104 07/09/2021   CO2 28 07/09/2021   GLUCOSE 141 (H)  07/09/2021   BUN 16 07/09/2021   CREATININE 1.03 07/09/2021   CALCIUM 8.5 (L) 07/09/2021      RADIOGRAPHY: No results found.    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  We discussed the natural history of *** and general treatment, highlighting the role of radiotherapy in the management.  We discussed the available radiation techniques, and focused on the details of logistics and delivery.  We reviewed the anticipated acute and late sequelae associated with radiation in this setting.  The patient was encouraged to ask questions that I answered to the best of my ability. *** A patient consent form was discussed and signed.  We retained a copy for our records.  The patient would like to proceed with radiation and will be scheduled for CT simulation.  PLAN: ***    *** minutes of total time was spent for this patient encounter, including preparation, face-to-face counseling with the patient and coordination of care, physical exam, and documentation of the encounter.   ------------------------------------------------  Blair Promise, PhD, MD  This document serves as a record of services personally performed by Gery Pray, MD. It was created on his behalf by Roney Mans, a trained medical scribe. The creation of this record is based on the scribe's personal observations and the provider's statements to them. This document has been checked and approved by the attending provider.

## 2021-07-27 ENCOUNTER — Ambulatory Visit
Admission: RE | Admit: 2021-07-27 | Discharge: 2021-07-27 | Disposition: A | Discharge status: Home / Self Care | Payer: BC Managed Care – PPO | Source: Ambulatory Visit | Attending: Radiation Oncology | Admitting: Radiation Oncology

## 2021-07-27 ENCOUNTER — Other Ambulatory Visit: Payer: Self-pay

## 2021-07-27 ENCOUNTER — Encounter: Payer: Self-pay | Admitting: Radiation Oncology

## 2021-07-27 VITALS — BP 130/90 | HR 72 | Temp 98.6°F | Resp 18 | Wt 247.0 lb

## 2021-07-27 DIAGNOSIS — Z905 Acquired absence of kidney: Secondary | ICD-10-CM | POA: Insufficient documentation

## 2021-07-27 DIAGNOSIS — Z800 Family history of malignant neoplasm of digestive organs: Secondary | ICD-10-CM | POA: Diagnosis not present

## 2021-07-27 DIAGNOSIS — C884 Extranodal marginal zone b-cell lymphoma of mucosa-associated lymphoid tissue (malt-lymphoma) not having achieved remission: Secondary | ICD-10-CM

## 2021-07-27 DIAGNOSIS — I10 Essential (primary) hypertension: Secondary | ICD-10-CM | POA: Insufficient documentation

## 2021-07-27 DIAGNOSIS — Z9049 Acquired absence of other specified parts of digestive tract: Secondary | ICD-10-CM | POA: Insufficient documentation

## 2021-07-27 DIAGNOSIS — Z923 Personal history of irradiation: Secondary | ICD-10-CM | POA: Diagnosis not present

## 2021-07-27 DIAGNOSIS — Z87442 Personal history of urinary calculi: Secondary | ICD-10-CM | POA: Insufficient documentation

## 2021-07-27 DIAGNOSIS — N281 Cyst of kidney, acquired: Secondary | ICD-10-CM | POA: Diagnosis not present

## 2021-07-27 DIAGNOSIS — Z79899 Other long term (current) drug therapy: Secondary | ICD-10-CM | POA: Insufficient documentation

## 2021-07-27 DIAGNOSIS — C641 Malignant neoplasm of right kidney, except renal pelvis: Secondary | ICD-10-CM | POA: Diagnosis not present

## 2021-07-27 DIAGNOSIS — Z8042 Family history of malignant neoplasm of prostate: Secondary | ICD-10-CM | POA: Diagnosis not present

## 2021-07-27 DIAGNOSIS — N200 Calculus of kidney: Secondary | ICD-10-CM | POA: Diagnosis not present

## 2021-07-27 DIAGNOSIS — Z8041 Family history of malignant neoplasm of ovary: Secondary | ICD-10-CM | POA: Diagnosis not present

## 2021-07-29 ENCOUNTER — Telehealth: Payer: Self-pay | Admitting: Genetic Counselor

## 2021-07-29 NOTE — Telephone Encounter (Signed)
Scheduled appt per 7/5 referral. Pt is aware of appt date and time. Pt is aware to arrive 15 mins prior to appt time and to bring and updated insurance card. Pt is aware of appt location.   

## 2021-08-03 ENCOUNTER — Ambulatory Visit
Admission: RE | Admit: 2021-08-03 | Discharge: 2021-08-03 | Disposition: A | Discharge status: Home / Self Care | Payer: BC Managed Care – PPO | Source: Ambulatory Visit | Attending: Radiation Oncology | Admitting: Radiation Oncology

## 2021-08-03 ENCOUNTER — Other Ambulatory Visit: Payer: Self-pay

## 2021-08-03 DIAGNOSIS — Z510 Encounter for antineoplastic radiation therapy: Secondary | ICD-10-CM | POA: Insufficient documentation

## 2021-08-03 DIAGNOSIS — C884 Extranodal marginal zone B-cell lymphoma of mucosa-associated lymphoid tissue [MALT-lymphoma]: Secondary | ICD-10-CM | POA: Insufficient documentation

## 2021-08-04 ENCOUNTER — Other Ambulatory Visit: Payer: Self-pay | Admitting: Radiation Oncology

## 2021-08-04 MED ORDER — PROCHLORPERAZINE MALEATE 10 MG PO TABS: 10.0000 mg | ORAL_TABLET | Freq: Four times a day (QID) | ORAL | 0 refills | Status: DC | PRN

## 2021-08-10 ENCOUNTER — Encounter: Payer: Self-pay | Admitting: Genetic Counselor

## 2021-08-10 DIAGNOSIS — C884 Extranodal marginal zone B-cell lymphoma of mucosa-associated lymphoid tissue [MALT-lymphoma]: Secondary | ICD-10-CM | POA: Diagnosis not present

## 2021-08-10 DIAGNOSIS — Z510 Encounter for antineoplastic radiation therapy: Secondary | ICD-10-CM | POA: Diagnosis not present

## 2021-08-11 ENCOUNTER — Other Ambulatory Visit: Payer: Self-pay | Admitting: Genetic Counselor

## 2021-08-11 ENCOUNTER — Other Ambulatory Visit: Payer: Self-pay

## 2021-08-11 ENCOUNTER — Ambulatory Visit
Admission: RE | Admit: 2021-08-11 | Discharge: 2021-08-11 | Disposition: A | Discharge status: Home / Self Care | Payer: BC Managed Care – PPO | Source: Ambulatory Visit | Attending: Radiation Oncology | Admitting: Radiation Oncology

## 2021-08-11 ENCOUNTER — Inpatient Hospital Stay: Payer: BC Managed Care – PPO

## 2021-08-11 ENCOUNTER — Inpatient Hospital Stay: Payer: BC Managed Care – PPO | Attending: Hematology & Oncology | Admitting: Genetic Counselor

## 2021-08-11 ENCOUNTER — Encounter: Payer: Self-pay | Admitting: Genetic Counselor

## 2021-08-11 DIAGNOSIS — C772 Secondary and unspecified malignant neoplasm of intra-abdominal lymph nodes: Secondary | ICD-10-CM | POA: Diagnosis not present

## 2021-08-11 DIAGNOSIS — C187 Malignant neoplasm of sigmoid colon: Secondary | ICD-10-CM

## 2021-08-11 DIAGNOSIS — Z510 Encounter for antineoplastic radiation therapy: Secondary | ICD-10-CM | POA: Diagnosis not present

## 2021-08-11 DIAGNOSIS — Z800 Family history of malignant neoplasm of digestive organs: Secondary | ICD-10-CM

## 2021-08-11 DIAGNOSIS — Z8042 Family history of malignant neoplasm of prostate: Secondary | ICD-10-CM

## 2021-08-11 DIAGNOSIS — Z8041 Family history of malignant neoplasm of ovary: Secondary | ICD-10-CM

## 2021-08-11 DIAGNOSIS — C884 Extranodal marginal zone B-cell lymphoma of mucosa-associated lymphoid tissue [MALT-lymphoma]: Secondary | ICD-10-CM | POA: Diagnosis not present

## 2021-08-11 LAB — RAD ONC ARIA SESSION SUMMARY
Course Elapsed Days: 0
Plan Fractions Treated to Date: 1
Plan Prescribed Dose Per Fraction: 1.8 Gy
Plan Total Fractions Prescribed: 15
Plan Total Prescribed Dose: 27 Gy
Reference Point Dosage Given to Date: 1.8 Gy
Reference Point Session Dosage Given: 1.8 Gy
Session Number: 1

## 2021-08-11 LAB — GENETIC SCREENING ORDER

## 2021-08-11 MED ORDER — SONAFINE EX EMUL
1.0000 | Freq: Once | CUTANEOUS | Status: AC
  Administered 2021-08-11: 1 via TOPICAL

## 2021-08-11 NOTE — Progress Notes (Signed)
REFERRING PROVIDER: Alexis Frock, MD [address] [place],  [place] [address]  PRIMARY PROVIDER:  Vernie Shanks, MD (Inactive)  PRIMARY REASON FOR VISIT:  1. Family history of colon cancer   2. Family history of ovarian cancer   3. Family history of prostate cancer   4. 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 Vincent Strickland, a 56 y.o. male, was seen for a [facility] cancer genetics consultation at the request of Dr. Tresa Moore due to a personal and family history of cancer.  Vincent Strickland presents to clinic today to discuss the possibility of a hereditary predisposition to cancer, genetic testing, and to further clarify his future cancer risks, as well as potential cancer risks for family members.   In 2017, at the age of 11, Vincent Strickland was diagnosed with Stage IIIc cancer of the colon. The treatment plan included a partial colectomy and chemotherapy.  MSI and IHC were normal.  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    CANCER HISTORY:  Oncology History   No history exists.    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 ([place]) 01/07/2016   Family history of colon cancer    Family history of ovarian cancer    Family history of prostate cancer    Headache    occas migraines   Hemorrhoid    High cholesterol    History of bladder infections    History of colon cancer, stage III 01/07/2016   History of kidney stones    History of radiation therapy 05/18/16-06/25/16   rectum 45 Gy in 25 fraction, rectom boost 5.4 Gy in 3 fractions   Hypertension    Hypogonadism male    Renal disorder    kidney stones   Vertigo, benign positional     Past Surgical History:  Procedure Laterality Date   COLONOSCOPY     Dr Paulita Fujita around 12/2016-1/20219   COLONOSCOPY WITH PROPOFOL Left 12/31/2015    Procedure: COLONOSCOPY WITH PROPOFOL;  Surgeon: Arta Silence, MD;  Location: [facility];  Service: Endoscopy;  Laterality: Left;   LAPAROSCOPIC SIGMOID COLECTOMY N/A 01/01/2016   Procedure: LAPAROSCOPIC ASSISTED SIGMOID COLECTOMY;  Surgeon: Johnathan Hausen, MD;  Location: [facility];  Service: General;  Laterality: N/A;   LITHOTRIPSY     PORT-A-CATH REMOVAL N/A 11/26/2016   Procedure: REMOVAL PORT-A-CATH;  Surgeon: Johnathan Hausen, MD;  Location: [facility];  Service: General;  Laterality: N/A;   PORTACATH PLACEMENT N/A 01/27/2016   Procedure: INSERTION PORT-A-CATH;  Surgeon: Johnathan Hausen, MD;  Location: [facility];  Service: General;  Laterality: N/A;   ROBOTIC ASSITED PARTIAL NEPHRECTOMY Right 07/08/2021   Procedure: XI ROBOTIC ASSITED PARTIAL NEPHRECTOMY, INTRAOPERATIVE ULTRASOUND;  Surgeon: Alexis Frock, MD;  Location: [facility];  Service: Urology;  Laterality: Right;  3 HRS   TONSILLECTOMY     UPPER GASTROINTESTINAL ENDOSCOPY      Social History   Socioeconomic History   Marital status: Married    Spouse name: Not on file   Number of children: Not on file   Years of education: Not on file   Highest education level: Not on file  Occupational History   Not on file  Tobacco Use   Smoking status: Never   Smokeless tobacco: Never  Vaping Use  Vaping Use: Never used  Substance and Sexual Activity   Alcohol use: Never   Drug use: Never   Sexual activity: Yes  Other Topics Concern   Not on file  Social History Narrative   Not on file   Social Determinants of Health   Financial Resource Strain: Not on file  Food Insecurity: Not on file  Transportation Needs: Not on file  Physical Activity: Not on file  Stress: Not on file  Social Connections: Not on file     FAMILY HISTORY:  We obtained a detailed, 4-generation family history.  Significant diagnoses are listed below: Family History  Problem Relation Age of Onset   Hypertension Mother    Ovarian cancer  Mother 75   Migraines Father    Prostate cancer Father        dx 38-69   Other Sister        non-malignant kidney tumor   Lung cancer Maternal Aunt    Diabetes Paternal Aunt    Esophageal cancer Maternal Grandmother    Hypertension Maternal Grandmother    Colon cancer Maternal Grandfather        d. late 15s   Diabetes Paternal Grandfather    COPD Paternal Grandfather    Rectal cancer Neg Hx    Stomach cancer Neg Hx      The patient has three children who are cancer free.  He has a brother and sister.  His sister had a non-malignant kidney tumor.  Both parents are living.  His mother was diagnosed with stage I ovarian cancer at age 46.  She has a brother and two sisters.  One sister has lung cancer.  The maternal grandparents are deceased.  The grandfather had colon cancer and the grandmother had esophageal cancer.  The patient's father had prostate cancer in his late 33's.  He had a sister who is cancer free.  His parents are deceased.  His father had amyloidosis and his great grandfather had an unknown cancer.  Vincent Strickland is unaware of previous family history of genetic testing for hereditary cancer risks. Patient's maternal ancestors are of [place] and [place] descent, and paternal ancestors are of [place] descent. There is no reported Ashkenazi Jewish ancestry. There is no known consanguinity.  GENETIC COUNSELING ASSESSMENT: Vincent Strickland is a 56 y.o. male with a personal and family history of cancer which is somewhat suggestive of a hereditary cancer syndrome and predisposition to cancer given the ages of onset and combination of cancer. We, therefore, discussed and recommended the following at today's visit.   DISCUSSION: We discussed that, in general, most cancer is not inherited in families, but instead is sporadic or familial. Sporadic cancers occur by chance and typically happen at older ages (>50 years) as this type of cancer is caused by genetic changes acquired during  an individual's lifetime. Some families have more cancers than would be expected by chance; however, the ages or types of cancer are not consistent with a known genetic mutation or known genetic mutations have been ruled out. This type of familial cancer is thought to be due to a combination of multiple genetic, environmental, hormonal, and lifestyle factors. While this combination of factors likely increases the risk of cancer, the exact source of this risk is not currently identifiable or testable.  We discussed that 5 - 10% of colon cancer is hereditary, with most cases associated with Lynch syndrome.  There are other genes that can be associated with hereditary colon cancer syndromes.  These  include APC, MUTYH, and BMPR1A.  We discussed that testing is beneficial for several reasons including knowing how to follow individuals after completing their treatment, and understand if other family members could be at risk for cancer and allow them to undergo genetic testing.   We reviewed the characteristics, features and inheritance patterns of hereditary cancer syndromes. We also discussed genetic testing, including the appropriate family members to test, the process of testing, insurance coverage and turn-around-time for results. We discussed the implications of a negative, positive, carrier and/or variant of uncertain significant result. We recommended Mr. Sturdivant pursue genetic testing for the Multi-cancer + RNA gene panel.   The Multi-Gene Panel offered by [facility] includes sequencing and/or deletion duplication testing of the following 84 genes: AIP, ALK, APC, ATM, AXIN2,BAP1,  BARD1, BLM, BMPR1A, BRCA1, BRCA2, BRIP1, CASR, CDC73, CDH1, CDK4, CDKN1B, CDKN1C, CDKN2A (p14ARF), CDKN2A (p16INK4a), CEBPA, CHEK2, CTNNA1, DICER1, DIS3L2, EGFR (c.2369C>T, p.Thr790Met variant only), EPCAM (Deletion/duplication testing only), FH, FLCN, GATA2, GPC3, GREM1 (Promoter region deletion/duplication testing only), HOXB13  (c.251G>A, p.Gly84Glu), HRAS, KIT, MAX, MEN1, MET, MITF (c.952G>A, p.Glu318Lys variant only), MLH1, MSH2, MSH3, MSH6, MUTYH, NBN, NF1, NF2, NTHL1, PALB2, PDGFRA, PHOX2B, PMS2, POLD1, POLE, POT1, PRKAR1A, PTCH1, PTEN, RAD50, RAD51C, RAD51D, RB1, RECQL4, RET, RUNX1, SDHAF2, SDHA (sequence changes only), SDHB, SDHC, SDHD, SMAD4, SMARCA4, SMARCB1, SMARCE1, STK11, SUFU, TERC, TERT, TMEM127, TP53, TSC1, TSC2, VHL, WRN and WT1.    Based on Mr. Aki's personal and family history of cancer, he meets medical criteria for genetic testing. Despite that he meets criteria, he may still have an out of pocket cost. We discussed that if his out of pocket cost for testing is over $100, the laboratory will call and confirm whether he wants to proceed with testing.  If the out of pocket cost of testing is less than $100 he will be billed by the genetic testing laboratory.   PLAN: After considering the risks, benefits, and limitations, Mr. Kimmel provided informed consent to pursue genetic testing and the blood sample was sent to [facility] for analysis of the Multi cancer +RNA panel. Results should be available within approximately 2-3 weeks' time, at which point they will be disclosed by telephone to Mr. Batson, as will any additional recommendations warranted by these results. Mr. Goree will receive a summary of his genetic counseling visit and a copy of his results once available. This information will also be available in Epic.   Lastly, we encouraged Mr. Orndoff to remain in contact with cancer genetics annually so that we can continuously update the family history and inform him of any changes in cancer genetics and testing that may be of benefit for this family.   Mr. Grilli's questions were answered to his satisfaction today. Our contact information was provided should additional questions or concerns arise. Thank you for the referral and allowing [place] to share in the care of your patient.   Erienne Spelman P.  [place], [place], [facility] Licensed, Insurance risk surveyor [email or web address] phone: [phone]  The patient was seen for a total of 40 minutes in face-to-face genetic counseling.  The patient brought his wife. This patient was discussed with Drs. Magrinat, Lindi Adie and/or Burr Medico who agrees with the above.    _______________________________________________________________________ For Office Staff:  Number of people involved in session: 2 Was an Intern/ student involved with case: no

## 2021-08-11 NOTE — Progress Notes (Signed)
Pt here for patient teaching.    Pt given Radiation and You booklet, skin care instructions, and Sonafine.    Reviewed areas of pertinence such as diarrhea, fatigue, hair loss in treatment field, nausea and vomiting, skin changes, and urinary and bladder changes .   Pt able to give teach back of to pat skin, use unscented/gentle soap, and drink plenty of water,apply Sonafine bid and avoid applying anything to skin within 4 hours of treatment.   Pt verbalizes understanding of information given and will contact nursing with any questions or concerns.

## 2021-08-12 ENCOUNTER — Ambulatory Visit
Admission: RE | Admit: 2021-08-12 | Discharge: 2021-08-12 | Disposition: A | Discharge status: Home / Self Care | Payer: BC Managed Care – PPO | Source: Ambulatory Visit | Attending: Radiation Oncology | Admitting: Radiation Oncology

## 2021-08-12 ENCOUNTER — Other Ambulatory Visit: Payer: Self-pay

## 2021-08-12 DIAGNOSIS — C884 Extranodal marginal zone B-cell lymphoma of mucosa-associated lymphoid tissue [MALT-lymphoma]: Secondary | ICD-10-CM | POA: Diagnosis not present

## 2021-08-12 DIAGNOSIS — Z510 Encounter for antineoplastic radiation therapy: Secondary | ICD-10-CM | POA: Diagnosis not present

## 2021-08-12 LAB — RAD ONC ARIA SESSION SUMMARY
Course Elapsed Days: 1
Plan Fractions Treated to Date: 2
Plan Prescribed Dose Per Fraction: 1.8 Gy
Plan Total Fractions Prescribed: 15
Plan Total Prescribed Dose: 27 Gy
Reference Point Dosage Given to Date: 3.6 Gy
Reference Point Session Dosage Given: 1.8 Gy
Session Number: 2

## 2021-08-13 ENCOUNTER — Other Ambulatory Visit: Payer: Self-pay

## 2021-08-13 ENCOUNTER — Ambulatory Visit
Admission: RE | Admit: 2021-08-13 | Discharge: 2021-08-13 | Disposition: A | Discharge status: Home / Self Care | Payer: BC Managed Care – PPO | Source: Ambulatory Visit | Attending: Radiation Oncology | Admitting: Radiation Oncology

## 2021-08-13 DIAGNOSIS — Z510 Encounter for antineoplastic radiation therapy: Secondary | ICD-10-CM | POA: Diagnosis not present

## 2021-08-13 DIAGNOSIS — C884 Extranodal marginal zone B-cell lymphoma of mucosa-associated lymphoid tissue [MALT-lymphoma]: Secondary | ICD-10-CM | POA: Diagnosis not present

## 2021-08-13 LAB — RAD ONC ARIA SESSION SUMMARY
Course Elapsed Days: 2
Plan Fractions Treated to Date: 3
Plan Prescribed Dose Per Fraction: 1.8 Gy
Plan Total Fractions Prescribed: 15
Plan Total Prescribed Dose: 27 Gy
Reference Point Dosage Given to Date: 5.4 Gy
Reference Point Session Dosage Given: 1.8 Gy
Session Number: 3

## 2021-08-14 ENCOUNTER — Ambulatory Visit
Admission: RE | Admit: 2021-08-14 | Discharge: 2021-08-14 | Disposition: A | Discharge status: Home / Self Care | Payer: BC Managed Care – PPO | Source: Ambulatory Visit | Attending: Radiation Oncology | Admitting: Radiation Oncology

## 2021-08-14 ENCOUNTER — Other Ambulatory Visit: Payer: Self-pay

## 2021-08-14 DIAGNOSIS — Z510 Encounter for antineoplastic radiation therapy: Secondary | ICD-10-CM | POA: Diagnosis not present

## 2021-08-14 DIAGNOSIS — C884 Extranodal marginal zone B-cell lymphoma of mucosa-associated lymphoid tissue [MALT-lymphoma]: Secondary | ICD-10-CM | POA: Diagnosis not present

## 2021-08-14 LAB — RAD ONC ARIA SESSION SUMMARY
Course Elapsed Days: 3
Plan Fractions Treated to Date: 4
Plan Prescribed Dose Per Fraction: 1.8 Gy
Plan Total Fractions Prescribed: 15
Plan Total Prescribed Dose: 27 Gy
Reference Point Dosage Given to Date: 7.2 Gy
Reference Point Session Dosage Given: 1.8 Gy
Session Number: 4

## 2021-08-16 ENCOUNTER — Other Ambulatory Visit: Payer: Self-pay | Admitting: Hematology & Oncology

## 2021-08-16 DIAGNOSIS — E876 Hypokalemia: Secondary | ICD-10-CM

## 2021-08-16 DIAGNOSIS — Z85038 Personal history of other malignant neoplasm of large intestine: Secondary | ICD-10-CM

## 2021-08-17 ENCOUNTER — Other Ambulatory Visit: Payer: Self-pay

## 2021-08-17 ENCOUNTER — Ambulatory Visit
Admission: RE | Admit: 2021-08-17 | Discharge: 2021-08-17 | Disposition: A | Discharge status: Home / Self Care | Payer: BC Managed Care – PPO | Source: Ambulatory Visit | Attending: Radiation Oncology | Admitting: Radiation Oncology

## 2021-08-17 DIAGNOSIS — C884 Extranodal marginal zone B-cell lymphoma of mucosa-associated lymphoid tissue [MALT-lymphoma]: Secondary | ICD-10-CM | POA: Diagnosis not present

## 2021-08-17 DIAGNOSIS — Z510 Encounter for antineoplastic radiation therapy: Secondary | ICD-10-CM | POA: Diagnosis not present

## 2021-08-17 LAB — RAD ONC ARIA SESSION SUMMARY
Course Elapsed Days: 6
Plan Fractions Treated to Date: 5
Plan Prescribed Dose Per Fraction: 1.8 Gy
Plan Total Fractions Prescribed: 15
Plan Total Prescribed Dose: 27 Gy
Reference Point Dosage Given to Date: 9 Gy
Reference Point Session Dosage Given: 1.8 Gy
Session Number: 5

## 2021-08-18 ENCOUNTER — Ambulatory Visit: Payer: BC Managed Care – PPO

## 2021-08-18 ENCOUNTER — Other Ambulatory Visit: Payer: Self-pay

## 2021-08-18 ENCOUNTER — Ambulatory Visit
Admission: RE | Admit: 2021-08-18 | Discharge: 2021-08-18 | Disposition: A | Discharge status: Home / Self Care | Payer: BC Managed Care – PPO | Source: Ambulatory Visit | Attending: Radiation Oncology | Admitting: Radiation Oncology

## 2021-08-18 DIAGNOSIS — C884 Extranodal marginal zone B-cell lymphoma of mucosa-associated lymphoid tissue [MALT-lymphoma]: Secondary | ICD-10-CM | POA: Diagnosis not present

## 2021-08-18 DIAGNOSIS — Z510 Encounter for antineoplastic radiation therapy: Secondary | ICD-10-CM | POA: Diagnosis not present

## 2021-08-18 LAB — RAD ONC ARIA SESSION SUMMARY
Course Elapsed Days: 7
Plan Fractions Treated to Date: 6
Plan Prescribed Dose Per Fraction: 1.8 Gy
Plan Total Fractions Prescribed: 15
Plan Total Prescribed Dose: 27 Gy
Reference Point Dosage Given to Date: 10.8 Gy
Reference Point Session Dosage Given: 1.8 Gy
Session Number: 6

## 2021-08-19 ENCOUNTER — Other Ambulatory Visit: Payer: Self-pay

## 2021-08-19 ENCOUNTER — Ambulatory Visit
Admission: RE | Admit: 2021-08-19 | Discharge: 2021-08-19 | Disposition: A | Discharge status: Home / Self Care | Payer: BC Managed Care – PPO | Source: Ambulatory Visit | Attending: Radiation Oncology | Admitting: Radiation Oncology

## 2021-08-19 DIAGNOSIS — C884 Extranodal marginal zone B-cell lymphoma of mucosa-associated lymphoid tissue [MALT-lymphoma]: Secondary | ICD-10-CM | POA: Diagnosis not present

## 2021-08-19 DIAGNOSIS — Z510 Encounter for antineoplastic radiation therapy: Secondary | ICD-10-CM | POA: Diagnosis not present

## 2021-08-19 LAB — RAD ONC ARIA SESSION SUMMARY
Course Elapsed Days: 8
Plan Fractions Treated to Date: 7
Plan Prescribed Dose Per Fraction: 1.8 Gy
Plan Total Fractions Prescribed: 15
Plan Total Prescribed Dose: 27 Gy
Reference Point Dosage Given to Date: 12.6 Gy
Reference Point Session Dosage Given: 1.8 Gy
Session Number: 7

## 2021-08-20 ENCOUNTER — Ambulatory Visit
Admission: RE | Admit: 2021-08-20 | Discharge: 2021-08-20 | Disposition: A | Discharge status: Home / Self Care | Payer: BC Managed Care – PPO | Source: Ambulatory Visit | Attending: Radiation Oncology | Admitting: Radiation Oncology

## 2021-08-20 ENCOUNTER — Other Ambulatory Visit: Payer: Self-pay

## 2021-08-20 DIAGNOSIS — C884 Extranodal marginal zone B-cell lymphoma of mucosa-associated lymphoid tissue [MALT-lymphoma]: Secondary | ICD-10-CM | POA: Diagnosis not present

## 2021-08-20 DIAGNOSIS — Z510 Encounter for antineoplastic radiation therapy: Secondary | ICD-10-CM | POA: Diagnosis not present

## 2021-08-20 LAB — RAD ONC ARIA SESSION SUMMARY
Course Elapsed Days: 9
Plan Fractions Treated to Date: 8
Plan Prescribed Dose Per Fraction: 1.8 Gy
Plan Total Fractions Prescribed: 15
Plan Total Prescribed Dose: 27 Gy
Reference Point Dosage Given to Date: 14.4 Gy
Reference Point Session Dosage Given: 1.8 Gy
Session Number: 8

## 2021-08-21 ENCOUNTER — Ambulatory Visit
Admission: RE | Admit: 2021-08-21 | Discharge: 2021-08-21 | Disposition: A | Discharge status: Home / Self Care | Payer: BC Managed Care – PPO | Source: Ambulatory Visit | Attending: Radiation Oncology | Admitting: Radiation Oncology

## 2021-08-21 ENCOUNTER — Other Ambulatory Visit: Payer: Self-pay

## 2021-08-21 DIAGNOSIS — Z510 Encounter for antineoplastic radiation therapy: Secondary | ICD-10-CM | POA: Diagnosis not present

## 2021-08-21 DIAGNOSIS — C884 Extranodal marginal zone B-cell lymphoma of mucosa-associated lymphoid tissue [MALT-lymphoma]: Secondary | ICD-10-CM | POA: Diagnosis not present

## 2021-08-21 LAB — RAD ONC ARIA SESSION SUMMARY
Course Elapsed Days: 10
Plan Fractions Treated to Date: 9
Plan Prescribed Dose Per Fraction: 1.8 Gy
Plan Total Fractions Prescribed: 15
Plan Total Prescribed Dose: 27 Gy
Reference Point Dosage Given to Date: 16.2 Gy
Reference Point Session Dosage Given: 1.8 Gy
Session Number: 9

## 2021-08-24 ENCOUNTER — Ambulatory Visit
Admission: RE | Admit: 2021-08-24 | Discharge: 2021-08-24 | Disposition: A | Discharge status: Home / Self Care | Payer: BC Managed Care – PPO | Source: Ambulatory Visit | Attending: Radiation Oncology | Admitting: Radiation Oncology

## 2021-08-24 ENCOUNTER — Other Ambulatory Visit: Payer: Self-pay

## 2021-08-24 DIAGNOSIS — Z510 Encounter for antineoplastic radiation therapy: Secondary | ICD-10-CM | POA: Diagnosis not present

## 2021-08-24 DIAGNOSIS — C884 Extranodal marginal zone B-cell lymphoma of mucosa-associated lymphoid tissue [MALT-lymphoma]: Secondary | ICD-10-CM | POA: Diagnosis not present

## 2021-08-24 LAB — RAD ONC ARIA SESSION SUMMARY
Course Elapsed Days: 13
Plan Fractions Treated to Date: 10
Plan Prescribed Dose Per Fraction: 1.8 Gy
Plan Total Fractions Prescribed: 15
Plan Total Prescribed Dose: 27 Gy
Reference Point Dosage Given to Date: 18 Gy
Reference Point Session Dosage Given: 1.8 Gy
Session Number: 10

## 2021-08-25 ENCOUNTER — Ambulatory Visit
Admission: RE | Admit: 2021-08-25 | Discharge: 2021-08-25 | Disposition: A | Discharge status: Home / Self Care | Payer: BC Managed Care – PPO | Source: Ambulatory Visit | Attending: Radiation Oncology | Admitting: Radiation Oncology

## 2021-08-25 ENCOUNTER — Other Ambulatory Visit: Payer: Self-pay

## 2021-08-25 DIAGNOSIS — Z510 Encounter for antineoplastic radiation therapy: Secondary | ICD-10-CM | POA: Diagnosis not present

## 2021-08-25 DIAGNOSIS — C884 Extranodal marginal zone B-cell lymphoma of mucosa-associated lymphoid tissue [MALT-lymphoma]: Secondary | ICD-10-CM | POA: Diagnosis not present

## 2021-08-25 LAB — RAD ONC ARIA SESSION SUMMARY
Course Elapsed Days: 14
Plan Fractions Treated to Date: 11
Plan Prescribed Dose Per Fraction: 1.8 Gy
Plan Total Fractions Prescribed: 15
Plan Total Prescribed Dose: 27 Gy
Reference Point Dosage Given to Date: 19.8 Gy
Reference Point Session Dosage Given: 1.8 Gy
Session Number: 11

## 2021-08-26 ENCOUNTER — Ambulatory Visit
Admission: RE | Admit: 2021-08-26 | Discharge: 2021-08-26 | Disposition: A | Discharge status: Home / Self Care | Payer: BC Managed Care – PPO | Source: Ambulatory Visit | Attending: Radiation Oncology | Admitting: Radiation Oncology

## 2021-08-26 ENCOUNTER — Other Ambulatory Visit: Payer: Self-pay

## 2021-08-26 DIAGNOSIS — C884 Extranodal marginal zone B-cell lymphoma of mucosa-associated lymphoid tissue [MALT-lymphoma]: Secondary | ICD-10-CM | POA: Diagnosis not present

## 2021-08-26 DIAGNOSIS — Z510 Encounter for antineoplastic radiation therapy: Secondary | ICD-10-CM | POA: Diagnosis not present

## 2021-08-26 LAB — RAD ONC ARIA SESSION SUMMARY
Course Elapsed Days: 15
Plan Fractions Treated to Date: 12
Plan Prescribed Dose Per Fraction: 1.8 Gy
Plan Total Fractions Prescribed: 15
Plan Total Prescribed Dose: 27 Gy
Reference Point Dosage Given to Date: 21.6 Gy
Reference Point Session Dosage Given: 1.8 Gy
Session Number: 12

## 2021-08-27 ENCOUNTER — Ambulatory Visit
Admission: RE | Admit: 2021-08-27 | Discharge: 2021-08-27 | Disposition: A | Discharge status: Home / Self Care | Payer: BC Managed Care – PPO | Source: Ambulatory Visit | Attending: Radiation Oncology | Admitting: Radiation Oncology

## 2021-08-27 ENCOUNTER — Other Ambulatory Visit: Payer: Self-pay

## 2021-08-27 DIAGNOSIS — Z510 Encounter for antineoplastic radiation therapy: Secondary | ICD-10-CM | POA: Diagnosis not present

## 2021-08-27 DIAGNOSIS — C884 Extranodal marginal zone B-cell lymphoma of mucosa-associated lymphoid tissue [MALT-lymphoma]: Secondary | ICD-10-CM | POA: Diagnosis not present

## 2021-08-27 LAB — RAD ONC ARIA SESSION SUMMARY
Course Elapsed Days: 16
Plan Fractions Treated to Date: 13
Plan Prescribed Dose Per Fraction: 1.8 Gy
Plan Total Fractions Prescribed: 15
Plan Total Prescribed Dose: 27 Gy
Reference Point Dosage Given to Date: 23.4 Gy
Reference Point Session Dosage Given: 1.8 Gy
Session Number: 13

## 2021-08-28 ENCOUNTER — Other Ambulatory Visit: Payer: Self-pay

## 2021-08-28 ENCOUNTER — Ambulatory Visit
Admission: RE | Admit: 2021-08-28 | Discharge: 2021-08-28 | Disposition: A | Discharge status: Home / Self Care | Payer: BC Managed Care – PPO | Source: Ambulatory Visit | Attending: Radiation Oncology | Admitting: Radiation Oncology

## 2021-08-28 DIAGNOSIS — Z510 Encounter for antineoplastic radiation therapy: Secondary | ICD-10-CM | POA: Diagnosis not present

## 2021-08-28 DIAGNOSIS — C884 Extranodal marginal zone B-cell lymphoma of mucosa-associated lymphoid tissue [MALT-lymphoma]: Secondary | ICD-10-CM | POA: Diagnosis not present

## 2021-08-28 LAB — RAD ONC ARIA SESSION SUMMARY
Course Elapsed Days: 17
Plan Fractions Treated to Date: 14
Plan Prescribed Dose Per Fraction: 1.8 Gy
Plan Total Fractions Prescribed: 15
Plan Total Prescribed Dose: 27 Gy
Reference Point Dosage Given to Date: 25.2 Gy
Reference Point Session Dosage Given: 1.8 Gy
Session Number: 14

## 2021-08-31 ENCOUNTER — Encounter: Payer: Self-pay | Admitting: Radiation Oncology

## 2021-08-31 ENCOUNTER — Other Ambulatory Visit: Payer: Self-pay

## 2021-08-31 ENCOUNTER — Ambulatory Visit
Admission: RE | Admit: 2021-08-31 | Discharge: 2021-08-31 | Disposition: A | Discharge status: Home / Self Care | Payer: BC Managed Care – PPO | Source: Ambulatory Visit | Attending: Radiation Oncology | Admitting: Radiation Oncology

## 2021-08-31 DIAGNOSIS — Z510 Encounter for antineoplastic radiation therapy: Secondary | ICD-10-CM | POA: Diagnosis not present

## 2021-08-31 DIAGNOSIS — C884 Extranodal marginal zone B-cell lymphoma of mucosa-associated lymphoid tissue [MALT-lymphoma]: Secondary | ICD-10-CM | POA: Diagnosis not present

## 2021-08-31 LAB — RAD ONC ARIA SESSION SUMMARY
Course Elapsed Days: 20
Plan Fractions Treated to Date: 15
Plan Prescribed Dose Per Fraction: 1.8 Gy
Plan Total Fractions Prescribed: 15
Plan Total Prescribed Dose: 27 Gy
Reference Point Dosage Given to Date: 27 Gy
Reference Point Session Dosage Given: 1.8 Gy
Session Number: 15

## 2021-09-01 DIAGNOSIS — F330 Major depressive disorder, recurrent, mild: Secondary | ICD-10-CM | POA: Diagnosis not present

## 2021-09-02 ENCOUNTER — Encounter: Payer: Self-pay | Admitting: Genetic Counselor

## 2021-09-02 ENCOUNTER — Ambulatory Visit: Payer: Self-pay | Admitting: Genetic Counselor

## 2021-09-02 ENCOUNTER — Telehealth: Payer: Self-pay | Admitting: Genetic Counselor

## 2021-09-02 DIAGNOSIS — Z1379 Encounter for other screening for genetic and chromosomal anomalies: Secondary | ICD-10-CM | POA: Insufficient documentation

## 2021-09-02 NOTE — Progress Notes (Signed)
HPI:  Mr. Leeman was previously seen in the [place] clinic due to a personal and family history of cancer and concerns regarding a hereditary predisposition to cancer. Please refer to our prior cancer genetics clinic note for more information regarding our discussion, assessment and recommendations, at the time. Mr. Eissler's recent genetic test results were disclosed to him, as were recommendations warranted by these results. These results and recommendations are discussed in more detail below.  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  The report date is August 26, 2021.  The Multi-Gene Panel offered by [facility] includes sequencing and/or deletion duplication testing of the following 84 genes: AIP, ALK, APC, ATM, AXIN2,BAP1,  BARD1, BLM, BMPR1A, BRCA1, BRCA2, BRIP1, CASR, CDC73, CDH1, CDK4, CDKN1B, CDKN1C, CDKN2A (p14ARF), CDKN2A (p16INK4a), CEBPA, CHEK2, CTNNA1, DICER1, DIS3L2, EGFR (c.2369C>T, p.Thr790Met variant only), EPCAM (Deletion/duplication testing only), FH, FLCN, GATA2, GPC3, GREM1 (Promoter region deletion/duplication testing only), HOXB13 (c.251G>A, p.Gly84Glu), HRAS, KIT, MAX, MEN1, MET, MITF (c.952G>A, p.Glu318Lys variant only), MLH1, MSH2, MSH3, MSH6, MUTYH, NBN, NF1, NF2, NTHL1, PALB2, PDGFRA, PHOX2B, PMS2, POLD1, POLE, POT1, PRKAR1A, PTCH1, PTEN, RAD50, RAD51C, RAD51D, RB1, RECQL4, RET, RUNX1, SDHAF2, SDHA (sequence changes only), SDHB, SDHC, SDHD, SMAD4, SMARCA4, SMARCB1, SMARCE1, STK11, SUFU, TERC, TERT, TMEM127, TP53, TSC1, TSC2, VHL, WRN and WT1.       FAMILY HISTORY:  We obtained a detailed, 4-generation family history.  Significant diagnoses are listed below: Family History  Problem Relation Age of Onset    Hypertension Mother    Ovarian cancer Mother 55   Migraines Father    Prostate cancer Father        dx 52-69   Other Sister        non-malignant kidney tumor   Lung cancer Maternal Aunt    Diabetes Paternal Aunt    Esophageal cancer Maternal Grandmother    Hypertension Maternal Grandmother    Colon cancer Maternal Grandfather        d. late 19s   Diabetes Paternal Grandfather    COPD Paternal Grandfather    Rectal cancer Neg Hx    Stomach cancer Neg Hx        The patient has three children who are cancer free.  He has a brother and sister.  His sister had a non-malignant kidney tumor.  Both parents are living.   His mother was diagnosed with stage I ovarian cancer at age 41.  She has a brother and two sisters.  One sister has lung cancer.  The maternal grandparents are deceased.  The grandfather had colon cancer and the grandmother had esophageal cancer.   The patient's father had prostate cancer in his late 13's.  He had a sister who is cancer free.  His parents are deceased.  His father had amyloidosis and his great grandfather had an unknown cancer.   Mr. Coble is unaware of previous family history of genetic testing for hereditary cancer risks. Patient's maternal ancestors are of [place] and [place] descent, and paternal ancestors are of [place] descent. There is no reported Ashkenazi Jewish ancestry. There is no known consanguinity  GENETIC TEST RESULTS: Genetic testing reported out on August 26, 2021 through the Multi-cancer panel found no pathogenic mutations. The Multi-Gene Panel offered by [facility] includes sequencing and/or deletion  duplication testing of the following 84 genes: AIP, ALK, APC, ATM, AXIN2,BAP1,  BARD1, BLM, BMPR1A, BRCA1, BRCA2, BRIP1, CASR, CDC73, CDH1, CDK4, CDKN1B, CDKN1C, CDKN2A (p14ARF), CDKN2A (p16INK4a), CEBPA, CHEK2, CTNNA1, DICER1, DIS3L2, EGFR (c.2369C>T, p.Thr790Met variant only), EPCAM (Deletion/duplication testing only), FH, FLCN, GATA2,  GPC3, GREM1 (Promoter region deletion/duplication testing only), HOXB13 (c.251G>A, p.Gly84Glu), HRAS, KIT, MAX, MEN1, MET, MITF (c.952G>A, p.Glu318Lys variant only), MLH1, MSH2, MSH3, MSH6, MUTYH, NBN, NF1, NF2, NTHL1, PALB2, PDGFRA, PHOX2B, PMS2, POLD1, POLE, POT1, PRKAR1A, PTCH1, PTEN, RAD50, RAD51C, RAD51D, RB1, RECQL4, RET, RUNX1, SDHAF2, SDHA (sequence changes only), SDHB, SDHC, SDHD, SMAD4, SMARCA4, SMARCB1, SMARCE1, STK11, SUFU, TERC, TERT, TMEM127, TP53, TSC1, TSC2, VHL, WRN and WT1.. The test report has been scanned into EPIC and is located under the Molecular Pathology section of the Results Review tab.  A portion of the result report is included below for reference.     We discussed with Mr. Garner that because current genetic testing is not perfect, it is possible there may be a gene mutation in one of these genes that current testing cannot detect, but that chance is small.  We also discussed, that there could be another gene that has not yet been discovered, or that we have not yet tested, that is responsible for the cancer diagnoses in the family. It is also possible there is a hereditary cause for the cancer in the family that Mr. Costanzo did not inherit and therefore was not identified in his testing.  Therefore, it is important to remain in touch with cancer genetics in the future so that we can continue to offer Mr. Noseworthy the most up to date genetic testing.   ADDITIONAL GENETIC TESTING: We discussed with Mr. Peyton that his genetic testing was fairly extensive.  If there are genes identified to increase cancer risk that can be analyzed in the future, we would be happy to discuss and coordinate this testing at that time.    CANCER SCREENING RECOMMENDATIONS: Mr. Hacking's test result is considered negative (normal).  This means that we have not identified a hereditary cause for his personal and family history of cancer at this time. Most cancers happen by chance and this negative test  suggests that his cancer may fall into this category.    While reassuring, this does not definitively rule out a hereditary predisposition to cancer. It is still possible that there could be genetic mutations that are undetectable by current technology. There could be genetic mutations in genes that have not been tested or identified to increase cancer risk.  Therefore, it is recommended he continue to follow the cancer management and screening guidelines provided by his oncology and primary healthcare provider.   An individual's cancer risk and medical management are not determined by genetic test results alone. Overall cancer risk assessment incorporates additional factors, including personal medical history, family history, and any available genetic information that may result in a personalized plan for cancer prevention and surveillance  RECOMMENDATIONS FOR FAMILY MEMBERS:  Individuals in this family might be at some increased risk of developing cancer, over the general population risk, simply due to the family history of cancer.  We recommended women in this family have a yearly mammogram beginning at age 76, or 77 years younger than the earliest onset of cancer, an annual clinical breast exam, and perform monthly breast self-exams. Women in this family should also have a gynecological exam as recommended by their primary provider. All family members should be referred for colonoscopy starting at age  28.  FOLLOW-UP: Lastly, we discussed with Mr. Kann that cancer genetics is a rapidly advancing field and it is possible that new genetic tests will be appropriate for him and/or his family members in the future. We encouraged him to remain in contact with cancer genetics on an annual basis so we can update his personal and family histories and let him know of advances in cancer genetics that may benefit this family.   Our contact number was provided. Mr. Larrick's questions were answered to his  satisfaction, and he knows he is welcome to call us at anytime with additional questions or concerns.   Roma Kayser, [place], [facility] Licensed, Certified Genetic Counselor [email or web address]

## 2021-09-02 NOTE — Telephone Encounter (Signed)
Revealed negative genetic testing.  Discussed that we do not know why he has lymphoma, colon and kidney cancer or why there is cancer in the family. It could be due to a different gene that we are not testing, or maybe our current technology may not be able to pick something up.  It will be important for him to keep in contact with genetics to keep up with whether additional testing may be needed.

## 2021-09-30 ENCOUNTER — Encounter: Payer: Self-pay | Admitting: Radiation Oncology

## 2021-10-02 NOTE — Progress Notes (Incomplete)
  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 During his final weekly treatment check on 08/25/21, the patient reported fatigue, nausea (relieved with compazine), regular soft stools, and a decreased appetite. The patient also stated that he had to cut back on working out due to his fatigue.   Plan: The patient will follow-up with radiation oncology in one month .  ________________________________________________ -----------------------------------  Blair Promise, PhD, MD  This document serves as a record of services personally performed by Gery Pray, MD. It was created on his behalf by Roney Mans, a trained medical scribe. The creation of this record is based on the scribe's personal observations and the provider's statements to them. This document has been checked and approved by the  attending provider.

## 2021-10-04 NOTE — Progress Notes (Signed)
Radiation Oncology         [phone]) [phone] ________________________________  Name: Naren Benally MRN: [id number]  Date: 10/05/2021  DOB: 1965-02-18  Follow-Up Visit Note  CC: Vernie Shanks, MD (Inactive)  Marin Olp Rudell Cobb, MD  No diagnosis found.  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  The patient tolerated radiation therapy relatively well. During his final weekly treatment check on 08/25/21, the patient reported fatigue, nausea (relieved with compazine), regular soft stools, and a decreased appetite. The patient also stated that he had to cut back on working out due to his fatigue.           In the interval, the patient underwent invitae genetic testing on 08/11/21. Results reported on 08/26/21 were negative.       Otherwise, no significant interval history since the patient was last seen.   ***               Allergies:  is allergic to niaspan [niacin], other, and percocet [oxycodone-acetaminophen].  Meds: Current Outpatient Medications  Medication Sig Dispense Refill   amLODipine (NORVASC) 5 MG tablet Take 5 mg by mouth daily.     Cholecalciferol (VITAMIN D3) 50 MCG (2000 UT) capsule 3 capsules     Cyanocobalamin (VITAMIN B12) 1000 MCG TBCR 1 tablet     docusate sodium (COLACE) 100 MG  capsule Take 1 capsule (100 mg total) by mouth 2 (two) times daily. (Patient not taking: Reported on 07/27/2021)     gabapentin (NEURONTIN) 300 MG capsule TAKE ONE CAP IN THE MORNING AND  2 CAPSULES  EVERY DAY AT BEDTIME. 90 capsule 4   irbesartan-hydrochlorothiazide (AVALIDE) 300-12.5 MG tablet Take 1 tablet by mouth daily.     ketorolac (ACULAR) 0.5 % ophthalmic solution Place 1 drop into the right eye 4 (four) times daily.     METAMUCIL FIBER PO Take 1 capsule by mouth daily.     pantoprazole (PROTONIX) 40 MG tablet Take 1 tablet (40 mg total) by mouth 2 (two) times daily. Best to take on an empty stomach 20-30 before food.  Start Protonix 40 mg po BID x 8 weeks, then reduce to 40 mg daily and continue to titrate off if no further need for long-term acid suppression therapy. 180 tablet 1   potassium chloride SA (KLOR-CON M) 20 MEQ tablet TAKE 2 TABLETS BY MOUTH TWICE DAILY 120 tablet 1   Probiotic Product (PROBIOTIC PO) Take 1 capsule by mouth daily.     prochlorperazine (COMPAZINE) 10 MG tablet Take 1 tablet (10 mg total) by mouth every 6 (six) hours as needed for nausea or vomiting. 30 tablet 0   Current Facility-Administered Medications  Medication Dose Route Frequency Provider Last Rate Last Admin   0.9 %  sodium chloride infusion  500 mL Intravenous Continuous Cirigliano, Vito V, DO  Physical Findings: The patient is in no acute distress. Patient is alert and oriented.  vitals were not taken for this visit. .  No significant changes. Lungs are clear to auscultation bilaterally. Heart has regular rate and rhythm. No palpable cervical, supraclavicular, or axillary adenopathy. Abdomen soft, non-tender, normal bowel sounds.   Lab Findings: Lab Results  Component Value Date   WBC 5.3 07/01/2021   HGB 16.1 07/09/2021   HCT 44.4 07/09/2021   MCV 88.1 07/01/2021   PLT 247 07/01/2021    Radiographic Findings: No results found.  Impression:  The primary encounter diagnosis was MALT  lymphoma ([place]). 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  ***  Plan:  ***   *** minutes of total time was spent for this patient encounter, including preparation, face-to-face counseling with the patient and coordination of care, physical exam, and documentation of the encounter. ____________________________________  Blair Promise, PhD, MD  This document serves as a record of services personally performed by Gery Pray, MD. It was created on his behalf by Roney Mans, a trained medical scribe. The creation of this record is based on the scribe's personal observations and the provider's statements to them. This document has been checked and approved by the attending provider.

## 2021-10-05 ENCOUNTER — Other Ambulatory Visit: Payer: Self-pay

## 2021-10-05 ENCOUNTER — Ambulatory Visit
Admission: RE | Admit: 2021-10-05 | Discharge: 2021-10-05 | Disposition: A | Discharge status: Home / Self Care | Payer: BC Managed Care – PPO | Source: Ambulatory Visit | Attending: Radiation Oncology | Admitting: Radiation Oncology

## 2021-10-05 DIAGNOSIS — C20 Malignant neoplasm of rectum: Secondary | ICD-10-CM | POA: Diagnosis not present

## 2021-10-05 DIAGNOSIS — Z923 Personal history of irradiation: Secondary | ICD-10-CM | POA: Diagnosis not present

## 2021-10-05 DIAGNOSIS — C641 Malignant neoplasm of right kidney, except renal pelvis: Secondary | ICD-10-CM | POA: Insufficient documentation

## 2021-10-05 DIAGNOSIS — R5383 Other fatigue: Secondary | ICD-10-CM | POA: Insufficient documentation

## 2021-10-05 DIAGNOSIS — C884 Extranodal marginal zone B-cell lymphoma of mucosa-associated lymphoid tissue [MALT-lymphoma]: Secondary | ICD-10-CM | POA: Insufficient documentation

## 2021-10-05 DIAGNOSIS — Z79899 Other long term (current) drug therapy: Secondary | ICD-10-CM | POA: Diagnosis not present

## 2021-10-05 NOTE — Progress Notes (Signed)
Vincent Strickland is here today for follow up post radiation to the abdomen.  They completed their radiation on: 08/31/2021   Does the patient complain of any of the following:  Pain:no Abdominal bloating: no Diarrhea/Constipation: no Nausea/Vomiting: no Blood in Urine or Stool: no Urinary Issues (dysuria/incomplete emptying/ incontinence/ increased frequency/urgency): no Post radiation skin changes: no   Additional comments if applicable: Reports his energy level has not returned fully.  BP (!) 125/98   Pulse 77   Temp (!) 97.5 F (36.4 C) (Oral)   Resp 18   Ht '6\' 1"'$  (1.854 m)   Wt 248 lb 12.8 oz (112.9 kg)   SpO2 99%   BMI 32.83 kg/m

## 2021-10-12 DIAGNOSIS — E538 Deficiency of other specified B group vitamins: Secondary | ICD-10-CM | POA: Diagnosis not present

## 2021-10-12 DIAGNOSIS — G629 Polyneuropathy, unspecified: Secondary | ICD-10-CM | POA: Diagnosis not present

## 2021-10-12 DIAGNOSIS — E559 Vitamin D deficiency, unspecified: Secondary | ICD-10-CM | POA: Diagnosis not present

## 2021-10-12 DIAGNOSIS — I10 Essential (primary) hypertension: Secondary | ICD-10-CM | POA: Diagnosis not present

## 2021-10-13 ENCOUNTER — Other Ambulatory Visit: Payer: Self-pay | Admitting: Hematology & Oncology

## 2021-10-13 DIAGNOSIS — F330 Major depressive disorder, recurrent, mild: Secondary | ICD-10-CM | POA: Diagnosis not present

## 2021-10-13 DIAGNOSIS — E876 Hypokalemia: Secondary | ICD-10-CM

## 2021-10-13 DIAGNOSIS — Z85038 Personal history of other malignant neoplasm of large intestine: Secondary | ICD-10-CM

## 2021-10-16 DIAGNOSIS — H353211 Exudative age-related macular degeneration, right eye, with active choroidal neovascularization: Secondary | ICD-10-CM | POA: Diagnosis not present

## 2021-10-16 DIAGNOSIS — H35711 Central serous chorioretinopathy, right eye: Secondary | ICD-10-CM | POA: Diagnosis not present

## 2021-10-16 DIAGNOSIS — H35033 Hypertensive retinopathy, bilateral: Secondary | ICD-10-CM | POA: Diagnosis not present

## 2021-10-16 DIAGNOSIS — H43823 Vitreomacular adhesion, bilateral: Secondary | ICD-10-CM | POA: Diagnosis not present

## 2021-10-23 DIAGNOSIS — Z8639 Personal history of other endocrine, nutritional and metabolic disease: Secondary | ICD-10-CM | POA: Diagnosis not present

## 2021-10-23 DIAGNOSIS — I10 Essential (primary) hypertension: Secondary | ICD-10-CM | POA: Diagnosis not present

## 2021-10-23 DIAGNOSIS — E538 Deficiency of other specified B group vitamins: Secondary | ICD-10-CM | POA: Diagnosis not present

## 2021-10-23 DIAGNOSIS — E559 Vitamin D deficiency, unspecified: Secondary | ICD-10-CM | POA: Diagnosis not present

## 2021-10-23 DIAGNOSIS — R7303 Prediabetes: Secondary | ICD-10-CM | POA: Diagnosis not present

## 2021-10-26 DIAGNOSIS — H353211 Exudative age-related macular degeneration, right eye, with active choroidal neovascularization: Secondary | ICD-10-CM | POA: Diagnosis not present

## 2021-11-24 DIAGNOSIS — F330 Major depressive disorder, recurrent, mild: Secondary | ICD-10-CM | POA: Diagnosis not present

## 2021-11-25 ENCOUNTER — Inpatient Hospital Stay: Payer: BC Managed Care – PPO | Attending: Hematology & Oncology

## 2021-11-25 DIAGNOSIS — Z85038 Personal history of other malignant neoplasm of large intestine: Secondary | ICD-10-CM | POA: Diagnosis not present

## 2021-11-25 DIAGNOSIS — N2889 Other specified disorders of kidney and ureter: Secondary | ICD-10-CM

## 2021-11-25 DIAGNOSIS — C187 Malignant neoplasm of sigmoid colon: Secondary | ICD-10-CM

## 2021-11-25 DIAGNOSIS — G629 Polyneuropathy, unspecified: Secondary | ICD-10-CM | POA: Diagnosis not present

## 2021-11-25 DIAGNOSIS — C641 Malignant neoplasm of right kidney, except renal pelvis: Secondary | ICD-10-CM | POA: Diagnosis not present

## 2021-11-25 DIAGNOSIS — Z905 Acquired absence of kidney: Secondary | ICD-10-CM | POA: Insufficient documentation

## 2021-11-25 DIAGNOSIS — Z923 Personal history of irradiation: Secondary | ICD-10-CM | POA: Diagnosis not present

## 2021-11-25 LAB — CMP (CANCER CENTER ONLY)
ALT: 35 U/L (ref 0–44)
AST: 24 U/L (ref 15–41)
Albumin: 4.6 g/dL (ref 3.5–5.0)
Alkaline Phosphatase: 85 U/L (ref 38–126)
Anion gap: 10 (ref 5–15)
BUN: 17 mg/dL (ref 6–20)
CO2: 31 mmol/L (ref 22–32)
Calcium: 10 mg/dL (ref 8.9–10.3)
Chloride: 104 mmol/L (ref 98–111)
Creatinine: 1.13 mg/dL (ref 0.61–1.24)
GFR, Estimated: >60 mL/min (ref >60)
Glucose, Bld: 154 mg/dL — ABNORMAL HIGH (ref 70–99)
Potassium: 3.6 mmol/L (ref 3.5–5.1)
Sodium: 145 mmol/L (ref 135–145)
Total Bilirubin: 1.2 mg/dL (ref 0.3–1.2)
Total Protein: 7.5 g/dL (ref 6.5–8.1)

## 2021-11-25 LAB — CBC WITH DIFFERENTIAL (CANCER CENTER ONLY)
Abs Immature Granulocytes: 0.02 10*3/uL (ref 0.00–0.07)
Basophils Absolute: 0.1 10*3/uL (ref 0.0–0.1)
Basophils Relative: 1 %
Eosinophils Absolute: 0.2 10*3/uL (ref 0.0–0.5)
Eosinophils Relative: 4 %
HCT: 43.3 % (ref 39.0–52.0)
Hemoglobin: 15.7 g/dL (ref 13.0–17.0)
Immature Granulocytes: 0 %
Lymphocytes Relative: 11 %
Lymphs Abs: 0.5 10*3/uL — ABNORMAL LOW (ref 0.7–4.0)
MCH: 32.2 pg (ref 26.0–34.0)
MCHC: 36.3 g/dL — ABNORMAL HIGH (ref 30.0–36.0)
MCV: 88.9 fL (ref 80.0–100.0)
Monocytes Absolute: 0.4 10*3/uL (ref 0.1–1.0)
Monocytes Relative: 10 %
Neutro Abs: 3.5 10*3/uL (ref 1.7–7.7)
Neutrophils Relative %: 74 %
Platelet Count: 209 10*3/uL (ref 150–400)
RBC: 4.87 MIL/uL (ref 4.22–5.81)
RDW: 13 % (ref 11.5–15.5)
WBC Count: 4.6 10*3/uL (ref 4.0–10.5)
nRBC: 0 % (ref 0.0–0.2)

## 2021-11-25 LAB — CEA (IN HOUSE-CHCC): CEA (CHCC-In House): 1.49 ng/mL (ref 0.00–5.00)

## 2021-11-25 LAB — LACTATE DEHYDROGENASE: LDH: 171 U/L (ref 98–192)

## 2021-11-27 ENCOUNTER — Inpatient Hospital Stay (HOSPITAL_BASED_OUTPATIENT_CLINIC_OR_DEPARTMENT_OTHER): Payer: BC Managed Care – PPO | Admitting: Hematology & Oncology

## 2021-11-27 ENCOUNTER — Encounter: Payer: Self-pay | Admitting: Hematology & Oncology

## 2021-11-27 VITALS — BP 135/97 | HR 78 | Temp 98.1°F | Resp 18 | Ht 73.5 in | Wt 263.0 lb

## 2021-11-27 DIAGNOSIS — C641 Malignant neoplasm of right kidney, except renal pelvis: Secondary | ICD-10-CM | POA: Diagnosis not present

## 2021-11-27 DIAGNOSIS — C772 Secondary and unspecified malignant neoplasm of intra-abdominal lymph nodes: Secondary | ICD-10-CM

## 2021-11-27 DIAGNOSIS — Z905 Acquired absence of kidney: Secondary | ICD-10-CM | POA: Diagnosis not present

## 2021-11-27 DIAGNOSIS — H35033 Hypertensive retinopathy, bilateral: Secondary | ICD-10-CM | POA: Diagnosis not present

## 2021-11-27 DIAGNOSIS — C187 Malignant neoplasm of sigmoid colon: Secondary | ICD-10-CM | POA: Diagnosis not present

## 2021-11-27 DIAGNOSIS — H35711 Central serous chorioretinopathy, right eye: Secondary | ICD-10-CM | POA: Diagnosis not present

## 2021-11-27 DIAGNOSIS — G629 Polyneuropathy, unspecified: Secondary | ICD-10-CM | POA: Diagnosis not present

## 2021-11-27 DIAGNOSIS — Z85038 Personal history of other malignant neoplasm of large intestine: Secondary | ICD-10-CM | POA: Diagnosis not present

## 2021-11-27 DIAGNOSIS — H353211 Exudative age-related macular degeneration, right eye, with active choroidal neovascularization: Secondary | ICD-10-CM | POA: Diagnosis not present

## 2021-11-27 DIAGNOSIS — H43823 Vitreomacular adhesion, bilateral: Secondary | ICD-10-CM | POA: Diagnosis not present

## 2021-11-27 HISTORY — DX: Malignant neoplasm of right kidney, except renal pelvis: C64.1

## 2021-11-27 NOTE — Progress Notes (Signed)
Hematology and Oncology Follow Up Visit  Vincent Strickland [id number] 02-Mar-1965 56 y.o. 11/27/2021   Principle Diagnosis:  Stage IIIC ([id number]) adenocarcinoma of the upper rectum Stage I (T1aN0M0) clear-cell carcinoma of the right kidney  Current Therapy:   FOLFOX s/p cycle #7 - completed on 04/21/2016 Radiation with Xeloda - started on 05/10/2016 FOLFIRI - to complete all of his chemotherapy 09/2016 Status post right partial nephrectomy-07/08/2021    Interim History:  Vincent Strickland is here today for follow-up.  We now dealing with a second malignancy.  He was found to have a renal cell carcinoma.  He had a clear-cell carcinoma of the right kidney.  He had a partial nephrectomy back on 07/08/2021.  The tumor measured 2 cm.  He did well after surgery.  He gets his scan [facility] at [facility].  He had a scans done on 11/25/2021.  The scans did not show any evidence of recurrent renal cell carcinoma or metastatic rectal cancer.  His last CEA level was 1.49.  He is doing okay.  He still has neuropathy.  He has had no problems with fever.  He has had no problems with COVID.  Has a wonderful job.  He works as Pension scheme manager.  He really enjoys working for them.  He has had no change in bowel or bladder habits.  He has had no cough or shortness of breath.  He has had no leg swelling.  He has had no bleeding.  Overall, I would say his performance status is probably ECOG 0.   Medications:  Allergies as of 11/27/2021       Reactions   Niaspan [niacin] Other (See Comments)   Severe flushing   Other Itching, Other (See Comments)   After port placement pt had severe skin reaction to SKIN CLEANSER in armpits and neck area "could see brush red brush marks, itched, burned and skin peeled off , severe burn" Possibly Chlorhexidine    Percocet [oxycodone-acetaminophen] Nausea Only, Other (See Comments)   hallucinations        Medication List        Accurate as of November 27, 2021  8:25 AM. If you have any questions, ask your nurse or doctor.          amLODipine 5 MG tablet Commonly known as: NORVASC Take 5 mg by mouth daily.   docusate sodium 100 MG capsule Commonly known as: COLACE Take 1 capsule (100 mg total) by mouth 2 (two) times daily.   gabapentin 300 MG capsule Commonly known as: NEURONTIN TAKE ONE CAP IN THE MORNING AND  2 CAPSULES  EVERY DAY AT BEDTIME.   irbesartan-hydrochlorothiazide 300-12.5 MG tablet Commonly known as: AVALIDE Take 1 tablet by mouth daily.   ketorolac 0.5 % ophthalmic solution Commonly known as: ACULAR Place 1 drop into the right eye 4 (four) times daily.   METAMUCIL FIBER PO Take 1 capsule by mouth daily.   pantoprazole 40 MG tablet Commonly known as: PROTONIX Take 1 tablet (40 mg total) by mouth 2 (two) times daily. Best to take on an empty stomach 20-30 before food.  Start Protonix 40 mg po BID x 8 weeks, then reduce to 40 mg daily and continue to titrate off if no further need for long-term acid suppression therapy.   potassium chloride SA 20 MEQ tablet Commonly known as: KLOR-CON M TAKE 2 TABLETS BY MOUTH TWICE DAILY   PROBIOTIC PO Take 1 capsule by mouth daily.   prochlorperazine 10  MG tablet Commonly known as: COMPAZINE Take 1 tablet (10 mg total) by mouth every 6 (six) hours as needed for nausea or vomiting.   Vitamin B12 1000 MCG Tbcr 1 tablet   Vitamin D3 50 MCG (2000 UT) capsule 3 capsules        Allergies:  Allergies  Allergen Reactions   Niaspan [Niacin] Other (See Comments)    Severe flushing   Other Itching and Other (See Comments)    After port placement pt had severe skin reaction to SKIN CLEANSER in armpits and neck area "could see brush red brush marks, itched, burned and skin peeled off , severe burn"  Possibly Chlorhexidine    Percocet [Oxycodone-Acetaminophen] Nausea Only and Other (See Comments)    hallucinations    Past Medical History, Surgical history, Social  history, and Family History were reviewed and updated.  Review of Systems: Review of Systems  Constitutional: Negative.   HENT: Negative.    Eyes: Negative.   Respiratory: Negative.    Cardiovascular: Negative.   Gastrointestinal: Negative.   Genitourinary: Negative.   Musculoskeletal: Negative.   Skin: Negative.   Neurological:  Positive for tingling.  Endo/Heme/Allergies: Negative.   Psychiatric/Behavioral: Negative.       Physical Exam:  height is 6' 1.5" (1.867 m) and weight is 263 lb (119.3 kg). His oral temperature is 98.1 F (36.7 C). His blood pressure is 135/97 (abnormal) and his pulse is 78. His respiration is 18 and oxygen saturation is 100%.   Wt Readings from Last 3 Encounters:  11/27/21 263 lb (119.3 kg)  10/05/21 248 lb 12.8 oz (112.9 kg)  07/27/21 247 lb (112 kg)    Physical Exam Vitals reviewed.  HENT:     Head: Normocephalic and atraumatic.  Eyes:     Pupils: Pupils are equal, round, and reactive to light.  Cardiovascular:     Rate and Rhythm: Normal rate and regular rhythm.     Heart sounds: Normal heart sounds.  Pulmonary:     Effort: Pulmonary effort is normal.     Breath sounds: Normal breath sounds.  Abdominal:     General: Bowel sounds are normal.     Palpations: Abdomen is soft.  Musculoskeletal:        General: No tenderness or deformity. Normal range of motion.     Cervical back: Normal range of motion.  Lymphadenopathy:     Cervical: No cervical adenopathy.  Skin:    General: Skin is warm and dry.     Findings: No erythema or rash.  Neurological:     Mental Status: He is alert and oriented to person, place, and time.  Psychiatric:        Behavior: Behavior normal.        Thought Content: Thought content normal.        Judgment: Judgment normal.      Lab Results  Component Value Date   WBC 4.6 11/25/2021   HGB 15.7 11/25/2021   HCT 43.3 11/25/2021   MCV 88.9 11/25/2021   PLT 209 11/25/2021   Lab Results  Component Value  Date   FERRITIN 34 01/13/2016   IRON 41 (L) 01/13/2016   TIBC 369 01/13/2016   UIBC 328 01/13/2016   IRONPCTSAT 11 (L) 01/13/2016   Lab Results  Component Value Date   RBC 4.87 11/25/2021   No results found for: "KPAFRELGTCHN", "LAMBDASER", "KAPLAMBRATIO" No results found for: "IGGSERUM", "IGA", "IGMSERUM" No results found for: "TOTALPROTELP", "ALBUMINELP", "A1GS", "A2GS", "BETS", "BETA2SER", "GAMS", "MSPIKE", "  SPEI"   Chemistry      Component Value Date/Time   NA 145 11/25/2021 0753   NA 146 (H) 12/29/2016 0838   K 3.6 11/25/2021 0753   K 3.6 12/29/2016 0838   CL 104 11/25/2021 0753   CL 105 12/29/2016 0838   CO2 31 11/25/2021 0753   CO2 29 12/29/2016 0838   BUN 17 11/25/2021 0753   BUN 11 12/29/2016 0838   CREATININE 1.13 11/25/2021 0753   CREATININE 0.9 12/29/2016 0838      Component Value Date/Time   CALCIUM 10.0 11/25/2021 0753   CALCIUM 9.9 12/29/2016 0838   ALKPHOS 85 11/25/2021 0753   ALKPHOS 71 12/29/2016 0838   AST 24 11/25/2021 0753   ALT 35 11/25/2021 0753   ALT 46 12/29/2016 0838   BILITOT 1.2 11/25/2021 0753     Impression and Plan: Mr. Buchanon is a pleasant 56 yo gentleman with 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  He had radiation and chemotherapy.  We did a "sandwich protocol".  He did quite well with this.  We completed treatment back in September 2018.  His main problem has been this renal cell carcinoma.  This is a early stage malignancy.  I would have to believe that his chance of cure should be over 90%.  We still have to follow along with scans.  It has been 5 years since he had treatment for the rectal cancer.  We now had to make sure he follow-up for the renal cell carcinoma.  We still need to get CT scans on him.  We will get scans in 6 months.  I am just happy that we were able to find his renal cell carcinoma early.  I will plan to get him back in the Spring.  He will have his CT scan done about a  week before I see him back.       [name], MD 11/3/20238:25 AM

## 2021-12-01 ENCOUNTER — Telehealth: Payer: Self-pay

## 2021-12-01 NOTE — Telephone Encounter (Signed)
-----   Message from [place] sent at 11/27/2021  2:56 PM EDT ----- I received a message from this patient's Oncologist. Can you please schedule this patient for colonoscopy w/ me in the [place].   Last colonoscopy in our system was by Will Outlaw in 12/2015.  He said he had a repeat in 2018 or 2019 (not in the EMR), and thought he was scheduled for another with Dr. Paulita Fujita later this year, but we can take over his surveillance at this point since he was seen by me earlier this year.   Thanks.

## 2021-12-01 NOTE — Telephone Encounter (Signed)
Pt scheduled for colonoscopy in [place] with Dr. Bryan Lemma on 01/29/22 at 10:30 am. Previsit scheduled for 12/13 at 8:00 am.

## 2021-12-01 NOTE — Telephone Encounter (Deleted)
-----   Message from [place] sent at 11/27/2021  2:56 PM EDT ----- I received a message from this patient's Oncologist. Can you please schedule this patient for colonoscopy w/ me in the [place].   Last colonoscopy in our system was by Will Outlaw in 12/2015.  He said he had a repeat in 2018 or 2019 (not in the EMR), and thought he was scheduled for another with Dr. Paulita Fujita later this year, but we can take over his surveillance at this point since he was seen by me earlier this year.   Thanks.

## 2021-12-08 ENCOUNTER — Other Ambulatory Visit: Payer: Self-pay | Admitting: Hematology & Oncology

## 2021-12-08 DIAGNOSIS — Z85038 Personal history of other malignant neoplasm of large intestine: Secondary | ICD-10-CM

## 2021-12-08 DIAGNOSIS — E876 Hypokalemia: Secondary | ICD-10-CM

## 2021-12-10 ENCOUNTER — Other Ambulatory Visit: Payer: Self-pay | Admitting: Gastroenterology

## 2021-12-10 DIAGNOSIS — R9389 Abnormal findings on diagnostic imaging of other specified body structures: Secondary | ICD-10-CM

## 2021-12-10 DIAGNOSIS — K2970 Gastritis, unspecified, without bleeding: Secondary | ICD-10-CM

## 2021-12-25 DIAGNOSIS — H353211 Exudative age-related macular degeneration, right eye, with active choroidal neovascularization: Secondary | ICD-10-CM | POA: Diagnosis not present

## 2021-12-25 DIAGNOSIS — H35711 Central serous chorioretinopathy, right eye: Secondary | ICD-10-CM | POA: Diagnosis not present

## 2021-12-25 DIAGNOSIS — H43823 Vitreomacular adhesion, bilateral: Secondary | ICD-10-CM | POA: Diagnosis not present

## 2021-12-25 DIAGNOSIS — H353122 Nonexudative age-related macular degeneration, left eye, intermediate dry stage: Secondary | ICD-10-CM | POA: Diagnosis not present

## 2022-01-05 DIAGNOSIS — F330 Major depressive disorder, recurrent, mild: Secondary | ICD-10-CM | POA: Diagnosis not present

## 2022-01-06 ENCOUNTER — Ambulatory Visit: Payer: BC Managed Care – PPO

## 2022-01-06 ENCOUNTER — Encounter: Payer: Self-pay | Admitting: Gastroenterology

## 2022-01-06 VITALS — Ht 73.5 in | Wt 263.0 lb

## 2022-01-06 DIAGNOSIS — Z85038 Personal history of other malignant neoplasm of large intestine: Secondary | ICD-10-CM

## 2022-01-06 MED ORDER — NA SULFATE-K SULFATE-MG SULF 17.5-3.13-1.6 GM/177ML PO SOLN: 1.0000 | Freq: Once | ORAL | 0 refills | Status: AC

## 2022-01-06 NOTE — Progress Notes (Signed)
No egg or soy allergy known to patient;  No issues known to pt with past sedation with any surgeries or procedures; Patient denies ever being told they had issues or difficulty with intubation;  No FH of Malignant Hyperthermia; Pt is not on diet pills; Pt is not on home 02;  Pt is not on blood thinners  Pt denies issues with constipation; takes daily Metamucil capsule; drinks water "all day long" No A fib or A flutter; Have any cardiac testing pending--NO Pt instructed to use Singlecare.com or GoodRx for a price reduction on prep;   Insurance verified during [place] appt=[employer] Blue Options for 2024  Patient's chart reviewed by Osvaldo Angst CNRA prior to previsit and patient appropriate for the [place].  Previsit completed and red dot placed by patient's name on their procedure day (on provider's schedule).    GoodRx coupon given to patient during [facility] appt;

## 2022-01-12 ENCOUNTER — Other Ambulatory Visit: Payer: Self-pay | Admitting: Hematology & Oncology

## 2022-01-12 DIAGNOSIS — E876 Hypokalemia: Secondary | ICD-10-CM

## 2022-01-12 DIAGNOSIS — Z85038 Personal history of other malignant neoplasm of large intestine: Secondary | ICD-10-CM

## 2022-01-22 DIAGNOSIS — H353211 Exudative age-related macular degeneration, right eye, with active choroidal neovascularization: Secondary | ICD-10-CM | POA: Diagnosis not present

## 2022-01-29 ENCOUNTER — Encounter: Payer: BC Managed Care – PPO | Admitting: Gastroenterology

## 2022-01-29 DIAGNOSIS — Z8639 Personal history of other endocrine, nutritional and metabolic disease: Secondary | ICD-10-CM | POA: Diagnosis not present

## 2022-01-29 DIAGNOSIS — I10 Essential (primary) hypertension: Secondary | ICD-10-CM | POA: Diagnosis not present

## 2022-01-29 DIAGNOSIS — G629 Polyneuropathy, unspecified: Secondary | ICD-10-CM | POA: Diagnosis not present

## 2022-02-03 ENCOUNTER — Other Ambulatory Visit: Payer: Self-pay | Admitting: Hematology & Oncology

## 2022-02-03 DIAGNOSIS — E876 Hypokalemia: Secondary | ICD-10-CM

## 2022-02-03 DIAGNOSIS — Z85038 Personal history of other malignant neoplasm of large intestine: Secondary | ICD-10-CM

## 2022-02-19 DIAGNOSIS — H353211 Exudative age-related macular degeneration, right eye, with active choroidal neovascularization: Secondary | ICD-10-CM | POA: Diagnosis not present

## 2022-02-23 DIAGNOSIS — Z6834 Body mass index (BMI) 34.0-34.9, adult: Secondary | ICD-10-CM | POA: Diagnosis not present

## 2022-02-23 DIAGNOSIS — E669 Obesity, unspecified: Secondary | ICD-10-CM | POA: Diagnosis not present

## 2022-03-15 ENCOUNTER — Other Ambulatory Visit: Payer: Self-pay | Admitting: Hematology & Oncology

## 2022-03-15 DIAGNOSIS — Z85038 Personal history of other malignant neoplasm of large intestine: Secondary | ICD-10-CM

## 2022-03-15 DIAGNOSIS — E876 Hypokalemia: Secondary | ICD-10-CM

## 2022-03-19 DIAGNOSIS — H35711 Central serous chorioretinopathy, right eye: Secondary | ICD-10-CM | POA: Diagnosis not present

## 2022-03-19 DIAGNOSIS — H353122 Nonexudative age-related macular degeneration, left eye, intermediate dry stage: Secondary | ICD-10-CM | POA: Diagnosis not present

## 2022-03-19 DIAGNOSIS — H353211 Exudative age-related macular degeneration, right eye, with active choroidal neovascularization: Secondary | ICD-10-CM | POA: Diagnosis not present

## 2022-03-19 DIAGNOSIS — H43823 Vitreomacular adhesion, bilateral: Secondary | ICD-10-CM | POA: Diagnosis not present

## 2022-03-22 ENCOUNTER — Ambulatory Visit (AMBULATORY_SURGERY_CENTER): Payer: BC Managed Care – PPO | Admitting: Gastroenterology

## 2022-03-22 ENCOUNTER — Encounter: Payer: Self-pay | Admitting: Gastroenterology

## 2022-03-22 VITALS — BP 134/83 | HR 65 | Temp 98.4°F | Resp 12 | Ht 73.5 in | Wt 263.0 lb

## 2022-03-22 DIAGNOSIS — D128 Benign neoplasm of rectum: Secondary | ICD-10-CM

## 2022-03-22 DIAGNOSIS — D125 Benign neoplasm of sigmoid colon: Secondary | ICD-10-CM

## 2022-03-22 DIAGNOSIS — Z08 Encounter for follow-up examination after completed treatment for malignant neoplasm: Secondary | ICD-10-CM | POA: Diagnosis not present

## 2022-03-22 DIAGNOSIS — Z85038 Personal history of other malignant neoplasm of large intestine: Secondary | ICD-10-CM

## 2022-03-22 DIAGNOSIS — D122 Benign neoplasm of ascending colon: Secondary | ICD-10-CM

## 2022-03-22 DIAGNOSIS — D124 Benign neoplasm of descending colon: Secondary | ICD-10-CM | POA: Diagnosis not present

## 2022-03-22 DIAGNOSIS — K641 Second degree hemorrhoids: Secondary | ICD-10-CM

## 2022-03-22 DIAGNOSIS — K621 Rectal polyp: Secondary | ICD-10-CM | POA: Diagnosis not present

## 2022-03-22 MED ORDER — SODIUM CHLORIDE 0.9 % IV SOLN: 500.0000 mL | Freq: Once | INTRAVENOUS | Status: DC

## 2022-03-22 NOTE — Progress Notes (Signed)
GASTROENTEROLOGY PROCEDURE H&P NOTE   Primary Care Physician: Vernie Shanks, MD (Inactive)    Reason for Procedure:  Colon cancer surveillance  Plan:    Colonoscopy  Patient is appropriate for endoscopic procedure(s) in the ambulatory ([facility]) setting.  The nature of the procedure, as well as the risks, benefits, and alternatives were carefully and thoroughly reviewed with the patient. Ample time for discussion and questions allowed. The patient understood, was satisfied, and agreed to proceed.     HPI: Vincent Strickland is a 57 y.o. male who presents for colonoscopy for ongoing surveillance.  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  - 05/13/2021: Normal CEA, CBC.  K+ 3.1 otherwise normal CMP - 05/23/2021: CT C/A/P for continued surveillance: 2 cm mass in the right kidney.  No evidence of metastatic disease.  Has referral in place to Urology - 06/04/2021: MRI abdomen for evaluation of renal mass: Potential enhancing mass or polyp vs infolding of upper gastric wall along the upper margin of proximal stomach measuring 2.4 x 1.4 x 1.1 cm.  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 Paulita Fujita): 8 mm sigmoid polyp, fungating ulcerated partially obstructing mass in the sigmoid (adenocarcinoma) - Colonoscopy approx 12/2016 or 01/2017 was unremarkable per patient.  - 06/16/2021: EGD: Focal area of moderately congested mucosa in gastric body measuring 2 cm with a small shallow-based nonbleeding ulcer (path: Suspicious for MALT lymphoma).  Small area of mild congestion mucosa in gastric body measuring 1 cm (path: Suspicious for MALT lymphoma), Scattered mild gastritis throughout (path:).  Normal esophagus and duodenum -  06/23/2021: Similar findings with additional biopsies obtained and sent for additional staining flow cytometry with additional gene rearrangement testing, all consistent with MALT lymphoma.  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   Renal disorder    kidney stones   Vertigo, benign positional     Past Surgical History:  Procedure Laterality Date   COLONOSCOPY     Dr Paulita Fujita around 12/2016-1/20219   COLONOSCOPY WITH PROPOFOL Left 12/31/2015   Procedure: COLONOSCOPY WITH PROPOFOL;  Surgeon: Arta Silence, MD;  Location: [facility];  Service: Endoscopy;  Laterality: Left;   LAPAROSCOPIC SIGMOID COLECTOMY N/A 01/01/2016  Procedure: LAPAROSCOPIC ASSISTED SIGMOID COLECTOMY;  Surgeon: Johnathan Hausen, MD;  Location: [facility];  Service: General;  Laterality: N/A;   LITHOTRIPSY     PORT-A-CATH REMOVAL N/A 11/26/2016   Procedure: REMOVAL PORT-A-CATH;  Surgeon: Johnathan Hausen, MD;  Location: [facility];  Service: General;  Laterality: N/A;   PORTACATH PLACEMENT N/A  01/27/2016   Procedure: INSERTION PORT-A-CATH;  Surgeon: Johnathan Hausen, MD;  Location: [facility];  Service: General;  Laterality: N/A;   ROBOTIC ASSITED PARTIAL NEPHRECTOMY Right 07/08/2021   Procedure: XI ROBOTIC ASSITED PARTIAL NEPHRECTOMY, INTRAOPERATIVE ULTRASOUND;  Surgeon: Alexis Frock, MD;  Location: [facility];  Service: Urology;  Laterality: Right;  3 HRS   TONSILLECTOMY     UPPER GASTROINTESTINAL ENDOSCOPY  2023    Prior to Admission medications   Medication Sig Start Date End Date Taking? Authorizing Provider  amLODipine (NORVASC) 5 MG tablet Take 5 mg by mouth daily. 03/30/19  Yes [provider]  Cholecalciferol (VITAMIN D3) 50 MCG (2000 UT) capsule Take 3 capsules by mouth daily. 12/11/20  Yes [provider]  Cyanocobalamin (VITAMIN B12) 1000 MCG TBCR Take 1 tablet by mouth daily at 6 (six) AM. 12/11/20  Yes [provider]  ezetimibe (ZETIA) 10 MG tablet Take 10 mg by mouth daily.   Yes [provider]  gabapentin (NEURONTIN) 300 MG capsule TAKE 1 CAPSULE BY MOUTH IN THE MORNING AND 2 CAPSULE BY MOUTH EVERY DAY AT BEDTIME 01/12/22  Yes Ennever, Rudell Cobb, MD  irbesartan-hydrochlorothiazide (AVALIDE) 300-12.5 MG tablet Take 1 tablet by mouth daily. 04/10/18  Yes [provider]  potassium chloride SA (KLOR-CON M) 20 MEQ tablet TAKE 2 TABLETS BY MOUTH TWICE DAILY 03/15/22  Yes Ennever, Rudell Cobb, MD  Probiotic Product (PROBIOTIC PO) Take 1 capsule by mouth in the morning and at bedtime.   Yes [provider]  METAMUCIL FIBER PO Take 1 capsule by mouth in the morning and at bedtime.    [provider]    Current Outpatient Medications  Medication Sig Dispense Refill   amLODipine (NORVASC) 5 MG tablet Take 5 mg by mouth daily.     Cholecalciferol (VITAMIN D3) 50 MCG (2000 UT) capsule Take 3 capsules by mouth daily.     Cyanocobalamin (VITAMIN B12) 1000 MCG TBCR Take 1 tablet by mouth daily at 6 (six) AM.     ezetimibe (ZETIA)  10 MG tablet Take 10 mg by mouth daily.     gabapentin (NEURONTIN) 300 MG capsule TAKE 1 CAPSULE BY MOUTH IN THE MORNING AND 2 CAPSULE BY MOUTH EVERY DAY AT BEDTIME 90 capsule 4   irbesartan-hydrochlorothiazide (AVALIDE) 300-12.5 MG tablet Take 1 tablet by mouth daily.     potassium chloride SA (KLOR-CON M) 20 MEQ tablet TAKE 2 TABLETS BY MOUTH TWICE DAILY 360 tablet 3   Probiotic Product (PROBIOTIC PO) Take 1 capsule by mouth in the morning and at bedtime.     METAMUCIL FIBER PO Take 1 capsule by mouth in the morning and at bedtime.     Current Facility-Administered Medications  Medication Dose Route Frequency Provider Last Rate Last Admin   0.9 %  sodium chloride infusion  500 mL Intravenous Continuous Amara Manalang V, DO       0.9 %  sodium chloride infusion  500 mL Intravenous Once Kayonna Lawniczak V, DO        Allergies as of 03/22/2022 - Review Complete 03/22/2022  Allergen Reaction Noted   Niaspan [niacin] Other (See Comments) 10/04/2012  Other Itching and Other (See Comments) 11/26/2016   Percocet [oxycodone-acetaminophen] Nausea Only and Other (See Comments) 10/04/2012    Family History  Problem Relation Age of Onset   Hypertension Mother    Ovarian cancer Mother 62   Migraines Father    Prostate cancer Father 16   Other Sister        non-malignant kidney tumor   Colon polyps Brother 41   Lung cancer Maternal Aunt 75   Diabetes Paternal Aunt    Esophageal cancer Maternal Grandmother 82   Hypertension Maternal Grandmother    Colon cancer Maternal Grandfather 71   Diabetes Paternal Grandfather    COPD Paternal Grandfather    Rectal cancer Neg Hx    Stomach cancer Neg Hx     Social History   Socioeconomic History   Marital status: Married    Spouse name: Not on file   Number of children: Not on file   Years of education: Not on file   Highest education level: Not on file  Occupational History   Not on file  Tobacco Use   Smoking status: Never   Smokeless  tobacco: Never  Vaping Use   Vaping Use: Never used  Substance and Sexual Activity   Alcohol use: Never   Drug use: Never   Sexual activity: Yes  Other Topics Concern   Not on file  Social History Narrative   Not on file   Social Determinants of Health   Financial Resource Strain: Not on file  Food Insecurity: Not on file  Transportation Needs: Not on file  Physical Activity: Not on file  Stress: Not on file  Social Connections: Not on file  Intimate Partner Violence: Not on file    Physical Exam: Vital signs in last 24 hours: '@BP'$  (!) 156/86   Pulse 100   Temp 98.4 F (36.9 C)   Ht 6' 1.5" (1.867 m)   Wt 263 lb (119.3 kg)   SpO2 97%   BMI 34.23 kg/m  GEN: NAD EYE: Sclerae anicteric ENT: MMM CV: Non-tachycardic Pulm: CTA b/l GI: Soft, NT/ND NEURO:  Alert & Oriented x 3   Gerrit Heck, DO [facility]   03/22/2022 7:51 AM

## 2022-03-22 NOTE — Progress Notes (Signed)
A/ox3, pleased with MAC, report to RN 

## 2022-03-22 NOTE — Op Note (Signed)
[facility] Patient Name: Vincent Strickland Procedure Date: 03/22/2022 7:18 AM MRN: [id number] Endoscopist: Gerrit Heck , MD, [id number] Age: 57 Referring MD:  Date of Birth: 1965-10-07 Gender: Male Account #: [id number] Procedure:                Colonoscopy Indications:              High risk colon cancer surveillance: Personal                            history of colon cancer                           History of stage IIIc adenocarcinoma of the distal                            sigmoid/upper rectum diagnosed 12/2015 s/p                            sigmoidectomy, chemotherapy, and radiation with                            Xeloda in 2018. Medicines:                Monitored Anesthesia Care Procedure:                Pre-Anesthesia Assessment:                           - Prior to the procedure, a History and Physical                            was performed, and patient medications and                            allergies were reviewed. The patient's tolerance of                            previous anesthesia was also reviewed. The risks                            and benefits of the procedure and the sedation                            options and risks were discussed with the patient.                            All questions were answered, and informed consent                            was obtained. Prior Anticoagulants: The patient has                            taken no anticoagulant or antiplatelet agents. ASA  Grade Assessment: II - A patient with mild systemic                            disease. After reviewing the risks and benefits,                            the patient was deemed in satisfactory condition to                            undergo the procedure.                           After obtaining informed consent, the colonoscope                            was passed under direct vision. Throughout the                             procedure, the patient's blood pressure, pulse, and                            oxygen saturations were monitored continuously. The                            CF [id number] was introduced through the anus                            and advanced to the the terminal ileum. The                            colonoscopy was performed without difficulty. The                            patient tolerated the procedure well. There was                            retained stool throughout, but after copious                            irrigation, the quality of the bowel preparation                            was good. The terminal ileum, ileocecal valve,                            appendiceal orifice, and rectum were photographed. Scope In: 8:06:54 AM Scope Out: 8:39:07 AM Scope Withdrawal Time: 0 hours 26 minutes 21 seconds  Total Procedure Duration: 0 hours 32 minutes 13 seconds  Findings:                 Hemorrhoids were found on perianal exam.                           A 4 mm polyp was found in the ascending colon.  The                            polyp was sessile. The polyp was removed with a                            cold snare. Resection and retrieval were complete.                            Estimated blood loss was minimal.                           A 3 mm polyp was found in the sigmoid colon. The                            polyp was sessile. The polyp was removed with a                            cold snare. Resection and retrieval were complete.                            Estimated blood loss was minimal.                           A 4 mm polyp was found in the rectum. The polyp was                            sessile. The polyp was removed with a cold snare.                            Resection and retrieval were complete. Estimated                            blood loss was minimal.                           Retroflexion in the right colon was performed.                           There  was evidence of a prior end-to-end                            colo-colonic anastomosis in the recto-sigmoid                            colon, located approximately 18 cm from the anal                            verge. This was patent and was characterized by                            healthy appearing mucosa. The anastomosis was  traversed.                           Non-bleeding internal hemorrhoids were found during                            retroflexion. The hemorrhoids were medium-sized and                            Grade II (internal hemorrhoids that prolapse but                            reduce spontaneously).                           The terminal ileum appeared normal. Complications:            No immediate complications. Estimated Blood Loss:     Estimated blood loss was minimal. Impression:               - Hemorrhoids found on perianal exam.                           - One 4 mm polyp in the ascending colon, removed                            with a cold snare. Resected and retrieved.                           - One 3 mm polyp in the sigmoid colon, removed with                            a cold snare. Resected and retrieved.                           - One 4 mm polyp in the rectum, removed with a cold                            snare. Resected and retrieved.                           - Patent end-to-end colo-colonic anastomosis,                            characterized by healthy appearing mucosa.                           - Non-bleeding internal hemorrhoids.                           - The examined portion of the ileum was normal. Recommendation:           - Patient has a contact number available for                            emergencies. The signs and symptoms of potential  delayed complications were discussed with the                            patient. Return to normal activities tomorrow.                             Written discharge instructions were provided to the                            patient.                           - Resume previous diet.                           - Continue present medications.                           - Await pathology results.                           - Repeat colonoscopy for surveillance based on                            pathology results.                           - Return to GI office PRN.                           - Internal hemorrhoids were noted on this study and                            may be amenable to hemorrhoid band ligation. If you                            are interested in further treatment of these                            hemorrhoids with band ligation, please contact my                            clinic to set up an appointment for evaluation and                            treatment. Gerrit Heck, MD 03/22/2022 8:51:05 AM

## 2022-03-22 NOTE — Patient Instructions (Signed)
Resume previous diet and medications. Awaiting pathology results. Repeat Colonoscopy date to be determined based on pathology results. Possible candidate for hemorrhoid banding. Reach out to office if interested.  Handouts provided on Colon polyps and Hemorrhoid Banding.  YOU HAD AN ENDOSCOPIC PROCEDURE TODAY AT [facility]:   Refer to the procedure report that was given to you for any specific questions about what was found during the examination.  If the procedure report does not answer your questions, please call your gastroenterologist to clarify.  If you requested that your care partner not be given the details of your procedure findings, then the procedure report has been included in a sealed envelope for you to review at your convenience later.  YOU SHOULD EXPECT: Some feelings of bloating in the abdomen. Passage of more gas than usual.  Walking can help get rid of the air that was put into your GI tract during the procedure and reduce the bloating. If you had a lower endoscopy (such as a colonoscopy or flexible sigmoidoscopy) you may notice spotting of blood in your stool or on the toilet paper. If you underwent a bowel prep for your procedure, you may not have a normal bowel movement for a few days.  Please Note:  You might notice some irritation and congestion in your nose or some drainage.  This is from the oxygen used during your procedure.  There is no need for concern and it should clear up in a day or so.  SYMPTOMS TO REPORT IMMEDIATELY:  Following lower endoscopy (colonoscopy or flexible sigmoidoscopy):  Excessive amounts of blood in the stool  Significant tenderness or worsening of abdominal pains  Swelling of the abdomen that is new, acute  Fever of 100F or higher   For urgent or emergent issues, a gastroenterologist can be reached at any hour by calling [phone]. Do not use MyChart messaging for urgent concerns.    DIET:  We do recommend a small meal  at first, but then you may proceed to your regular diet.  Drink plenty of fluids but you should avoid alcoholic beverages for 24 hours.  ACTIVITY:  You should plan to take it easy for the rest of today and you should NOT DRIVE or use heavy machinery until tomorrow (because of the sedation medicines used during the test).    FOLLOW UP: Our staff will call the number listed on your records the next business day following your procedure.  We will call around 7:15- 8:00 am to check on you and address any questions or concerns that you may have regarding the information given to you following your procedure. If we do not reach you, we will leave a message.     If any biopsies were taken you will be contacted by phone or by letter within the next 1-3 weeks.  Please call us at [phone] if you have not heard about the biopsies in 3 weeks.    SIGNATURES/CONFIDENTIALITY: You and/or your care partner have signed paperwork which will be entered into your electronic medical record.  These signatures attest to the fact that that the information above on your After Visit Summary has been reviewed and is understood.  Full responsibility of the confidentiality of this discharge information lies with you and/or your care-partner.

## 2022-03-22 NOTE — Progress Notes (Signed)
Called to room to assist during endoscopic procedure.  Patient ID and intended procedure confirmed with present staff. Received instructions for my participation in the procedure from the performing physician.  

## 2022-03-23 ENCOUNTER — Telehealth: Payer: Self-pay | Admitting: *Deleted

## 2022-03-23 NOTE — Telephone Encounter (Signed)
  Follow up Call-     03/22/2022    7:14 AM 06/23/2021    9:14 AM 06/16/2021    7:17 AM  Call back number  Post procedure Call Back phone  # [phone]  Permission to leave phone message Yes Yes Yes     Patient questions:  Do you have a fever, pain , or abdominal swelling? No. Pain Score  0 *  Have you tolerated food without any problems? Yes.    Have you been able to return to your normal activities? Yes.    Do you have any questions about your discharge instructions: Diet   No. Medications  No. Follow up visit  No.  Do you have questions or concerns about your Care? No.  Actions: * If pain score is 4 or above: No action needed, pain <4.

## 2022-03-29 ENCOUNTER — Encounter: Payer: Self-pay | Admitting: Gastroenterology

## 2022-04-20 DIAGNOSIS — Z6834 Body mass index (BMI) 34.0-34.9, adult: Secondary | ICD-10-CM | POA: Diagnosis not present

## 2022-04-20 DIAGNOSIS — E669 Obesity, unspecified: Secondary | ICD-10-CM | POA: Diagnosis not present

## 2022-04-30 DIAGNOSIS — H43823 Vitreomacular adhesion, bilateral: Secondary | ICD-10-CM | POA: Diagnosis not present

## 2022-04-30 DIAGNOSIS — H353122 Nonexudative age-related macular degeneration, left eye, intermediate dry stage: Secondary | ICD-10-CM | POA: Diagnosis not present

## 2022-04-30 DIAGNOSIS — H35711 Central serous chorioretinopathy, right eye: Secondary | ICD-10-CM | POA: Diagnosis not present

## 2022-04-30 DIAGNOSIS — H353211 Exudative age-related macular degeneration, right eye, with active choroidal neovascularization: Secondary | ICD-10-CM | POA: Diagnosis not present

## 2022-05-14 ENCOUNTER — Telehealth: Payer: Self-pay

## 2022-05-14 NOTE — Telephone Encounter (Signed)
Patient called for status of CT scan, spoke with wife. Scan is ready to schedule at [facility] number provided

## 2022-05-28 ENCOUNTER — Ambulatory Visit: Payer: BC Managed Care – PPO | Admitting: Hematology & Oncology

## 2022-06-03 DIAGNOSIS — H353211 Exudative age-related macular degeneration, right eye, with active choroidal neovascularization: Secondary | ICD-10-CM | POA: Diagnosis not present

## 2022-06-07 DIAGNOSIS — C772 Secondary and unspecified malignant neoplasm of intra-abdominal lymph nodes: Secondary | ICD-10-CM | POA: Diagnosis not present

## 2022-06-07 DIAGNOSIS — C187 Malignant neoplasm of sigmoid colon: Secondary | ICD-10-CM | POA: Diagnosis not present

## 2022-06-22 ENCOUNTER — Encounter: Payer: Self-pay | Admitting: Hematology & Oncology

## 2022-06-22 ENCOUNTER — Inpatient Hospital Stay: Payer: BC Managed Care – PPO | Attending: Hematology & Oncology

## 2022-06-22 ENCOUNTER — Inpatient Hospital Stay: Payer: BC Managed Care – PPO | Admitting: Hematology & Oncology

## 2022-06-22 ENCOUNTER — Other Ambulatory Visit: Payer: Self-pay

## 2022-06-22 VITALS — BP 138/93 | HR 71 | Temp 98.2°F | Resp 18 | Ht 73.5 in | Wt 252.0 lb

## 2022-06-22 DIAGNOSIS — C641 Malignant neoplasm of right kidney, except renal pelvis: Secondary | ICD-10-CM | POA: Insufficient documentation

## 2022-06-22 DIAGNOSIS — C20 Malignant neoplasm of rectum: Secondary | ICD-10-CM | POA: Insufficient documentation

## 2022-06-22 DIAGNOSIS — Z9221 Personal history of antineoplastic chemotherapy: Secondary | ICD-10-CM | POA: Insufficient documentation

## 2022-06-22 DIAGNOSIS — C884 Extranodal marginal zone B-cell lymphoma of mucosa-associated lymphoid tissue [MALT-lymphoma]: Secondary | ICD-10-CM | POA: Diagnosis not present

## 2022-06-22 DIAGNOSIS — C187 Malignant neoplasm of sigmoid colon: Secondary | ICD-10-CM

## 2022-06-22 DIAGNOSIS — Z923 Personal history of irradiation: Secondary | ICD-10-CM | POA: Diagnosis not present

## 2022-06-22 DIAGNOSIS — C772 Secondary and unspecified malignant neoplasm of intra-abdominal lymph nodes: Secondary | ICD-10-CM

## 2022-06-22 LAB — CMP (CANCER CENTER ONLY)
ALT: 37 U/L (ref 0–44)
AST: 23 U/L (ref 15–41)
Albumin: 4.7 g/dL (ref 3.5–5.0)
Alkaline Phosphatase: 77 U/L (ref 38–126)
Anion gap: 6 (ref 5–15)
BUN: 19 mg/dL (ref 6–20)
CO2: 32 mmol/L (ref 22–32)
Calcium: 10.1 mg/dL (ref 8.9–10.3)
Chloride: 106 mmol/L (ref 98–111)
Creatinine: 1.09 mg/dL (ref 0.61–1.24)
GFR, Estimated: >60 mL/min (ref >60)
Glucose, Bld: 139 mg/dL — ABNORMAL HIGH (ref 70–99)
Potassium: 3.7 mmol/L (ref 3.5–5.1)
Sodium: 144 mmol/L (ref 135–145)
Total Bilirubin: 0.9 mg/dL (ref 0.3–1.2)
Total Protein: 7 g/dL (ref 6.5–8.1)

## 2022-06-22 LAB — CBC WITH DIFFERENTIAL (CANCER CENTER ONLY)
Abs Immature Granulocytes: 0.01 10*3/uL (ref 0.00–0.07)
Basophils Absolute: 0.1 10*3/uL (ref 0.0–0.1)
Basophils Relative: 1 %
Eosinophils Absolute: 0.1 10*3/uL (ref 0.0–0.5)
Eosinophils Relative: 2 %
HCT: 43.8 % (ref 39.0–52.0)
Hemoglobin: 15.9 g/dL (ref 13.0–17.0)
Immature Granulocytes: 0 %
Lymphocytes Relative: 16 %
Lymphs Abs: 0.9 10*3/uL (ref 0.7–4.0)
MCH: 32.3 pg (ref 26.0–34.0)
MCHC: 36.3 g/dL — ABNORMAL HIGH (ref 30.0–36.0)
MCV: 89 fL (ref 80.0–100.0)
Monocytes Absolute: 0.4 10*3/uL (ref 0.1–1.0)
Monocytes Relative: 8 %
Neutro Abs: 3.8 10*3/uL (ref 1.7–7.7)
Neutrophils Relative %: 73 %
Platelet Count: 222 10*3/uL (ref 150–400)
RBC: 4.92 MIL/uL (ref 4.22–5.81)
RDW: 13 % (ref 11.5–15.5)
WBC Count: 5.3 10*3/uL (ref 4.0–10.5)
nRBC: 0 % (ref 0.0–0.2)

## 2022-06-22 LAB — LACTATE DEHYDROGENASE: LDH: 165 U/L (ref 98–192)

## 2022-06-22 LAB — CEA (IN HOUSE-CHCC): CEA (CHCC-In House): 1.28 ng/mL (ref 0.00–5.00)

## 2022-06-22 NOTE — Progress Notes (Signed)
Hematology and Oncology Follow Up Visit  Vincent Strickland [id number] [birth year] 56 y.o. 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  He is doing quite well.  The news is that he had [employer] move down in [place].  He might be looking at a new job that is down in [place].  He will know sometime this summer.  He did have a CT scan that was done at [facility] a week or so ago.  The CT scan not show any evidence of malignancy.  He has had 3 malignancies.  He has gone through each malignancy without any problems.  He has had no issues with nausea or vomiting.  He has had no problems with cough or shortness of breath.  He has had no problems with COVID.  There is been no change in bowel or bladder habits.  He has had no leg swelling.  He is exercising a lot more.  He is losing weight.  His wife has lost about 80 pounds.  He still has some neuropathy in his feet and hands.  Hopefully, this will slowly improve.  He has had no issues with bleeding.  Overall, I would say that his performance status is probably ECOG 1.      Medications:  Allergies as of 06/22/2022       Reactions   Niaspan [niacin] Other (See Comments)   Severe flushing   Other Itching, Other (See Comments)   After port placement pt had severe skin reaction to SKIN CLEANSER in armpits and neck area "could see brush red brush marks, itched, burned and skin peeled off , severe burn" Possibly Chlorhexidine    Percocet [oxycodone-acetaminophen] Nausea Only, Other  (See Comments)   hallucinations        Medication List        Accurate as of Jun 22, 2022  9:12 AM. If you have any questions, ask your nurse or doctor.          amLODipine 5 MG tablet Commonly known as: NORVASC Take 5 mg by mouth daily.   ezetimibe 10 MG tablet Commonly known as: ZETIA Take 10 mg by mouth daily.   gabapentin 300 MG capsule Commonly known as: NEURONTIN TAKE 1 CAPSULE BY MOUTH IN THE MORNING AND 2 CAPSULE BY MOUTH EVERY DAY AT BEDTIME   irbesartan-hydrochlorothiazide 300-12.5 MG tablet Commonly known as: AVALIDE Take 1 tablet by mouth daily.   METAMUCIL FIBER PO Take 1 capsule by mouth in the morning and at bedtime.   potassium chloride SA 20 MEQ tablet Commonly known as: KLOR-CON M TAKE 2 TABLETS BY MOUTH TWICE DAILY   PROBIOTIC PO Take 1 capsule by mouth in the morning and at bedtime.   Vitamin B12 1000 MCG Tbcr Take 1 tablet by mouth daily at 6 (six) AM.   Vitamin D3 50 MCG (2000 UT) capsule Take 3 capsules by mouth daily.        Allergies:  Allergies  Allergen Reactions   Niaspan [Niacin] Other (See Comments)    Severe flushing   Other  Itching and Other (See Comments)    After port placement pt had severe skin reaction to SKIN CLEANSER in armpits and neck area "could see brush red brush marks, itched, burned and skin peeled off , severe burn"  Possibly Chlorhexidine    Percocet [Oxycodone-Acetaminophen] Nausea Only and Other (See Comments)    hallucinations    Past Medical History, Surgical history, Social history, and Family History were reviewed and updated.  Review of Systems: Review of Systems  Constitutional: Negative.   HENT: Negative.    Eyes: Negative.   Respiratory: Negative.    Cardiovascular: Negative.   Gastrointestinal: Negative.   Genitourinary: Negative.   Musculoskeletal: Negative.   Skin: Negative.   Neurological:  Positive for tingling.  Endo/Heme/Allergies: Negative.   Psychiatric/Behavioral:  Negative.       Physical Exam:  height is 6' 1.5" (1.867 m) and weight is 252 lb (114.3 kg). His oral temperature is 98.2 F (36.8 C). His blood pressure is 138/93 (abnormal) and his pulse is 71. His respiration is 18 and oxygen saturation is 100%.   Wt Readings from Last 3 Encounters:  06/22/22 252 lb (114.3 kg)  03/22/22 263 lb (119.3 kg)  01/06/22 263 lb (119.3 kg)    Physical Exam Vitals reviewed.  HENT:     Head: Normocephalic and atraumatic.  Eyes:     Pupils: Pupils are equal, round, and reactive to light.  Cardiovascular:     Rate and Rhythm: Normal rate and regular rhythm.     Heart sounds: Normal heart sounds.  Pulmonary:     Effort: Pulmonary effort is normal.     Breath sounds: Normal breath sounds.  Abdominal:     General: Bowel sounds are normal.     Palpations: Abdomen is soft.  Musculoskeletal:        General: No tenderness or deformity. Normal range of motion.     Cervical back: Normal range of motion.  Lymphadenopathy:     Cervical: No cervical adenopathy.  Skin:    General: Skin is warm and dry.     Findings: No erythema or rash.  Neurological:     Mental Status: He is alert and oriented to person, place, and time.  Psychiatric:        Behavior: Behavior normal.        Thought Content: Thought content normal.        Judgment: Judgment normal.      Lab Results  Component Value Date   WBC 5.3 06/22/2022   HGB 15.9 06/22/2022   HCT 43.8 06/22/2022   MCV 89.0 06/22/2022   PLT 222 06/22/2022   Lab Results  Component Value Date   FERRITIN 34 01/13/2016   IRON 41 (L) 01/13/2016   TIBC 369 01/13/2016   UIBC 328 01/13/2016   IRONPCTSAT 11 (L) 01/13/2016   Lab Results  Component Value Date   RBC 4.92 06/22/2022   No results found for: "KPAFRELGTCHN", "LAMBDASER", "KAPLAMBRATIO" No results found for: "IGGSERUM", "IGA", "IGMSERUM" No results found for: "TOTALPROTELP", "ALBUMINELP", "A1GS", "A2GS", "BETS", "BETA2SER", "GAMS", "MSPIKE",  "SPEI"   Chemistry      Component Value Date/Time   NA 144 06/22/2022 0751   NA 146 (H) 12/29/2016 0838   K 3.7 06/22/2022 0751   K 3.6 12/29/2016 0838   CL 106 06/22/2022 0751   CL 105 12/29/2016 0838   CO2 32 06/22/2022 0751   CO2 29 12/29/2016 0838   BUN 19 06/22/2022 0751   BUN 11 12/29/2016 1610  CREATININE 1.09 06/22/2022 0751   CREATININE 0.9 12/29/2016 0838      Component Value Date/Time   CALCIUM 10.1 06/22/2022 0751   CALCIUM 9.9 12/29/2016 0838   ALKPHOS 77 06/22/2022 0751   ALKPHOS 71 12/29/2016 0838   AST 23 06/22/2022 0751   ALT 37 06/22/2022 0751   ALT 46 12/29/2016 0838   BILITOT 0.9 06/22/2022 0751     Impression and Plan: Mr. Almonte is a pleasant 57 yo gentleman with 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  He had radiation and chemotherapy.  We did a "sandwich protocol".  He did quite well with this.  We completed treatment back in September 2018.  He subsequently has had a MALT lymphoma of the stomach.  He had radiation therapy for this.  He also had early stage kidney cancer of the right kidney.  He had a partial nephrectomy for this.  On his right upper back, there is a suspicious lesion.  He does have very fair skin.  Of course, he does not have a dermatologist.  We will have to make a referral for dermatology.  I think he still needs to have his scans done.  I will have to set him up with another scan in 6 months.  This usually works pretty well for him.  If he does move to [place], we will have to find a doctor for him down there.  If he is still apparent 6 months, we will see him back.  His main problem has been this renal cell carcinoma.  This is a     Josph Macho, MD 5/28/20249:12 AM

## 2022-06-23 ENCOUNTER — Other Ambulatory Visit: Payer: Self-pay | Admitting: *Deleted

## 2022-06-23 DIAGNOSIS — Z8041 Family history of malignant neoplasm of ovary: Secondary | ICD-10-CM

## 2022-06-23 DIAGNOSIS — C772 Secondary and unspecified malignant neoplasm of intra-abdominal lymph nodes: Secondary | ICD-10-CM

## 2022-06-23 DIAGNOSIS — Z1379 Encounter for other screening for genetic and chromosomal anomalies: Secondary | ICD-10-CM

## 2022-06-23 DIAGNOSIS — N289 Disorder of kidney and ureter, unspecified: Secondary | ICD-10-CM

## 2022-06-23 DIAGNOSIS — Z800 Family history of malignant neoplasm of digestive organs: Secondary | ICD-10-CM

## 2022-06-23 DIAGNOSIS — C641 Malignant neoplasm of right kidney, except renal pelvis: Secondary | ICD-10-CM

## 2022-06-25 ENCOUNTER — Telehealth: Payer: Self-pay | Admitting: *Deleted

## 2022-06-25 NOTE — Telephone Encounter (Signed)
Referral faxed to Dr. Nita Sells in [place] [phone]

## 2022-07-02 DIAGNOSIS — H35711 Central serous chorioretinopathy, right eye: Secondary | ICD-10-CM | POA: Diagnosis not present

## 2022-07-09 DIAGNOSIS — H353211 Exudative age-related macular degeneration, right eye, with active choroidal neovascularization: Secondary | ICD-10-CM | POA: Diagnosis not present

## 2022-07-26 DIAGNOSIS — D0371 Melanoma in situ of right lower limb, including hip: Secondary | ICD-10-CM | POA: Diagnosis not present

## 2022-07-26 DIAGNOSIS — D485 Neoplasm of uncertain behavior of skin: Secondary | ICD-10-CM | POA: Diagnosis not present

## 2022-07-26 DIAGNOSIS — D2272 Melanocytic nevi of left lower limb, including hip: Secondary | ICD-10-CM | POA: Diagnosis not present

## 2022-07-26 DIAGNOSIS — D225 Melanocytic nevi of trunk: Secondary | ICD-10-CM | POA: Diagnosis not present

## 2022-07-26 DIAGNOSIS — Z1283 Encounter for screening for malignant neoplasm of skin: Secondary | ICD-10-CM | POA: Diagnosis not present

## 2022-07-27 DIAGNOSIS — H35051 Retinal neovascularization, unspecified, right eye: Secondary | ICD-10-CM | POA: Diagnosis not present

## 2022-07-27 DIAGNOSIS — H35711 Central serous chorioretinopathy, right eye: Secondary | ICD-10-CM | POA: Diagnosis not present

## 2022-08-11 DIAGNOSIS — L988 Other specified disorders of the skin and subcutaneous tissue: Secondary | ICD-10-CM | POA: Diagnosis not present

## 2022-08-11 DIAGNOSIS — D0371 Melanoma in situ of right lower limb, including hip: Secondary | ICD-10-CM | POA: Diagnosis not present

## 2022-08-26 DIAGNOSIS — E876 Hypokalemia: Secondary | ICD-10-CM | POA: Diagnosis not present

## 2022-08-26 DIAGNOSIS — H353211 Exudative age-related macular degeneration, right eye, with active choroidal neovascularization: Secondary | ICD-10-CM | POA: Diagnosis not present

## 2022-08-26 DIAGNOSIS — I10 Essential (primary) hypertension: Secondary | ICD-10-CM | POA: Diagnosis not present

## 2022-08-26 DIAGNOSIS — G629 Polyneuropathy, unspecified: Secondary | ICD-10-CM | POA: Diagnosis not present

## 2022-08-26 DIAGNOSIS — Z8639 Personal history of other endocrine, nutritional and metabolic disease: Secondary | ICD-10-CM | POA: Diagnosis not present

## 2022-08-26 DIAGNOSIS — E559 Vitamin D deficiency, unspecified: Secondary | ICD-10-CM | POA: Diagnosis not present

## 2022-08-26 DIAGNOSIS — E538 Deficiency of other specified B group vitamins: Secondary | ICD-10-CM | POA: Diagnosis not present

## 2022-08-26 DIAGNOSIS — R7303 Prediabetes: Secondary | ICD-10-CM | POA: Diagnosis not present

## 2022-12-10 ENCOUNTER — Other Ambulatory Visit: Payer: BC Managed Care – PPO

## 2022-12-13 ENCOUNTER — Other Ambulatory Visit: Payer: Self-pay | Admitting: Hematology & Oncology

## 2022-12-13 DIAGNOSIS — E876 Hypokalemia: Secondary | ICD-10-CM

## 2022-12-13 DIAGNOSIS — Z85038 Personal history of other malignant neoplasm of large intestine: Secondary | ICD-10-CM

## 2022-12-24 ENCOUNTER — Ambulatory Visit: Payer: BC Managed Care – PPO | Admitting: Hematology & Oncology

## 2023-02-08 ENCOUNTER — Other Ambulatory Visit: Payer: Self-pay

## 2023-02-08 DIAGNOSIS — Z85038 Personal history of other malignant neoplasm of large intestine: Secondary | ICD-10-CM

## 2023-02-08 DIAGNOSIS — E876 Hypokalemia: Secondary | ICD-10-CM

## 2023-02-08 MED ORDER — GABAPENTIN 300 MG PO CAPS
ORAL_CAPSULE | ORAL | 4 refills | Status: AC
Start: 2023-02-08 — End: ?

## 2023-06-03 IMAGING — MR MR ABDOMEN WO/W CM
18 series · 48 of 48 positions shown · IV contrast (10 GADAVIST)
Comparison: Report from CT exam 05/22/2021.  12/31/2015.

CLINICAL DATA: Enhancing right renal mass on CT scan at [facility] on 05/22/2021. History of colon cancer, under
surveillance.

INSERT body
EXAM:
MRI ABDOMEN WITHOUT AND WITH CONTRAST
TECHNIQUE: Multiplanar multisequence MR imaging of the abdomen was performed
both before and after the administration of intravenous contrast.
CONTRAST:  10mL GADAVIST GADOBUTROL 1 MMOL/ML IV SOLN

[Series 3: T2 · coronal · 6.0mm · 1.56mm/px · 2 of 33 slices shown (1 of 2)]
[im 1/33]
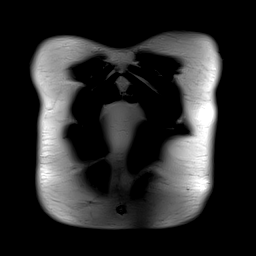
[im 33/33]
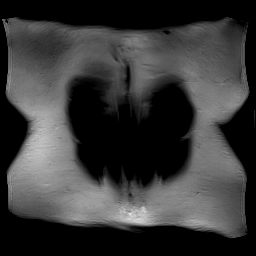

[Series 5: T2 fat-sat · axial · 6.0mm · 1.25mm/px · z∈[-139,+142]mm · 2 of 40 slices shown]
[im 1/40]
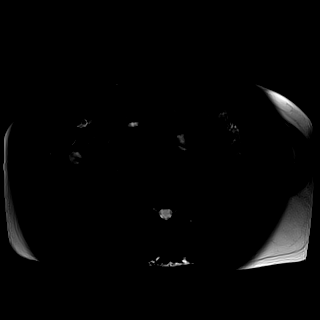
[im 40/40]
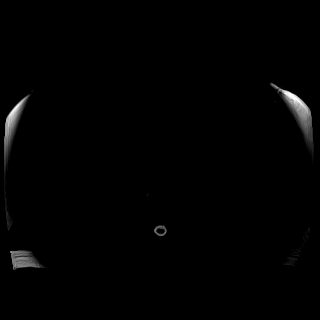

[Series 6: T1 · axial · 3.0mm · 1.25mm/px · z∈[-130,+130]mm · 4 of 88 slices shown (1 of 2)]
[im 1/88]
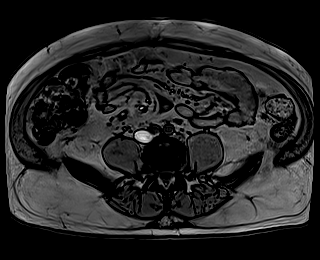
[im 30/88]
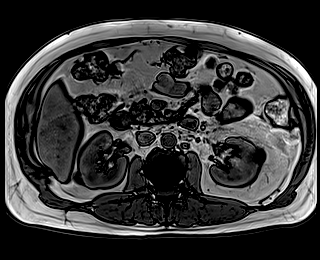
[im 59/88]
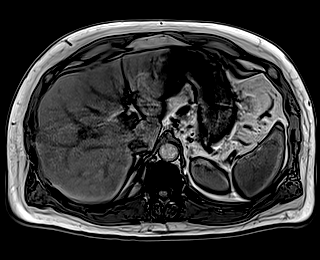
[im 88/88]
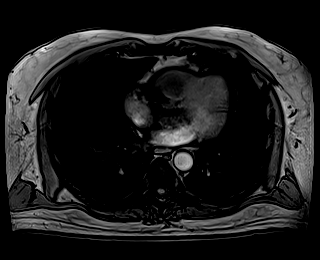

[Series 7: T1 · axial · 3.0mm · 1.25mm/px · z∈[-130,+130]mm · 3 of 88 slices shown (2 of 2)]
[im 1/88]
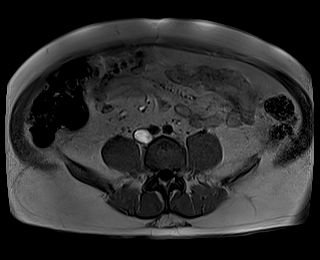
[im 44/88]
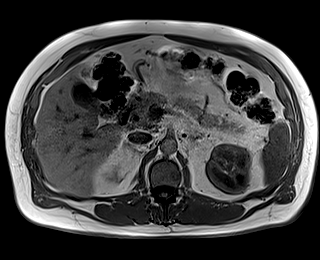
[im 88/88]
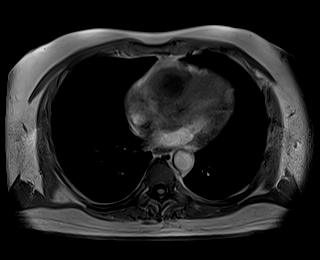

[Series 8: DWI · axial · 6.0mm · 1.49mm/px · z∈[-119,+133]mm · 3 of 72 slices shown (1 of 2)]
[im 1/72]
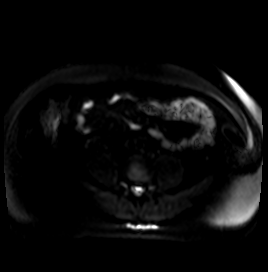
[im 36/72]
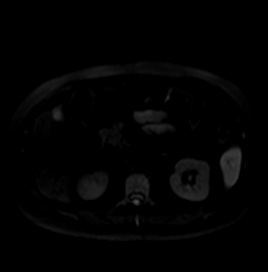
[im 72/72]
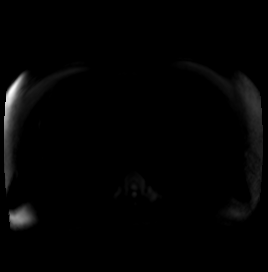

[Series 9: DWI · axial · 6.0mm · 1.49mm/px · 1 of 36 slices shown (2 of 2)]
[im 1/36]
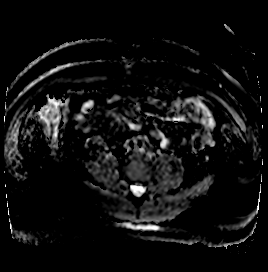

[Series 10: bSSFP · axial · 5.0mm · 0.84mm/px · z∈[-129,+129]mm · 2 of 48 slices shown]
[im 1/48]
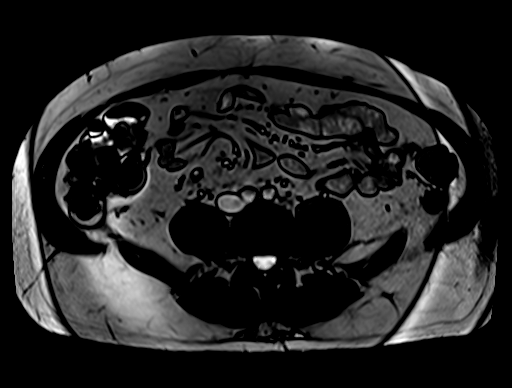
[im 48/48]
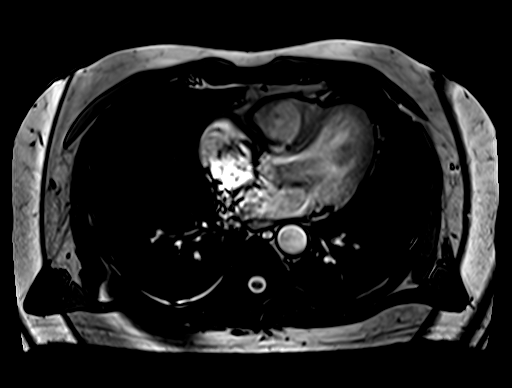

[Series 12: T1 dynamic · axial · 3.0mm · 1.25mm/px · z∈[-132,+129]mm · 3 of 88 slices shown (1 of 10)]
[im 1/88]
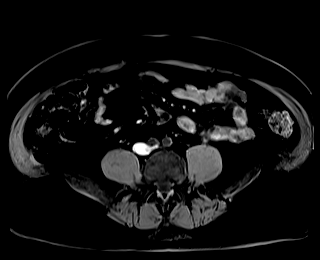
[im 44/88]
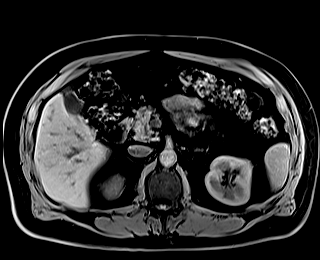
[im 88/88]
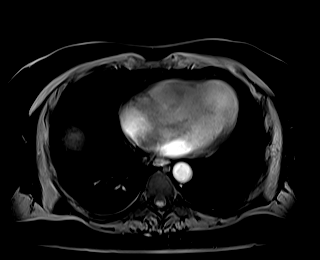

[Series 16: T1 dynamic · axial · 3.0mm · 1.25mm/px · z∈[-132,+129]mm · 3 of 88 slices shown (2 of 10)]
[im 1/88]
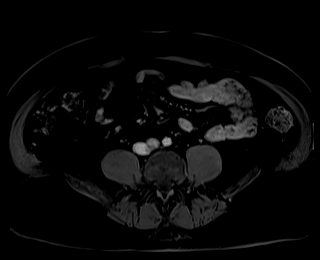
[im 44/88]
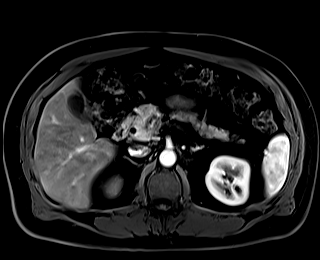
[im 88/88]
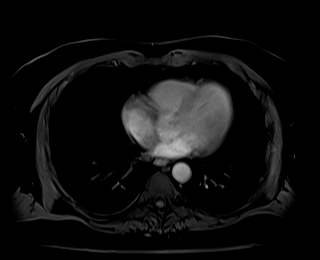

[Series 17: T1 dynamic · axial · 3.0mm · 1.25mm/px · z∈[-132,+129]mm · 3 of 88 slices shown (3 of 10)]
[im 1/88]
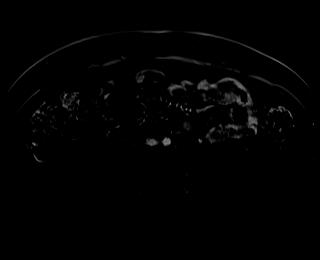
[im 44/88]
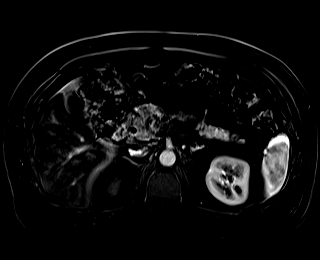
[im 88/88]
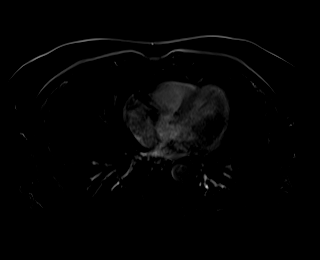

[Series 20: T1 dynamic · axial · 3.0mm · 1.25mm/px · z∈[-132,+129]mm · 3 of 88 slices shown (4 of 10)]
[im 1/88]
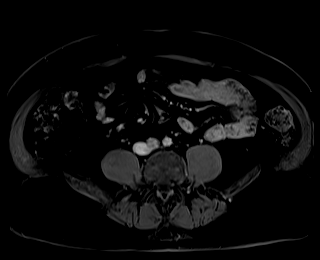
[im 44/88]
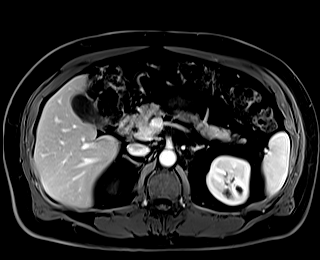
[im 88/88]
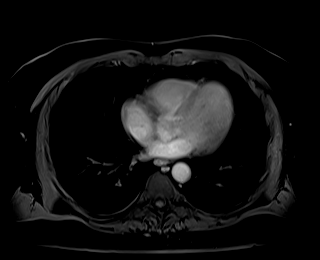

[Series 21: T1 dynamic · axial · 3.0mm · 1.25mm/px · z∈[-132,+129]mm · 3 of 88 slices shown (5 of 10)]
[im 1/88]
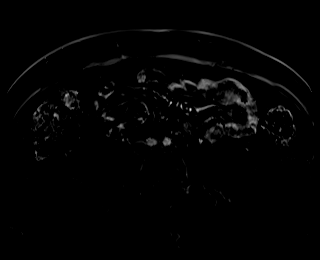
[im 44/88]
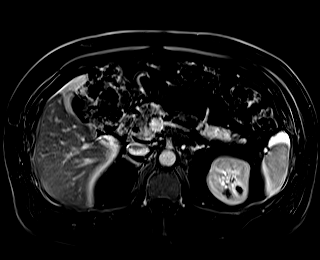
[im 88/88]
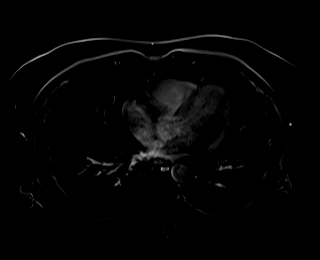

[Series 24: T1 dynamic · axial · 3.0mm · 1.25mm/px · z∈[-132,+129]mm · 3 of 88 slices shown (6 of 10)]
[im 1/88]
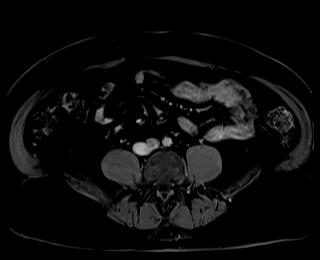
[im 44/88]
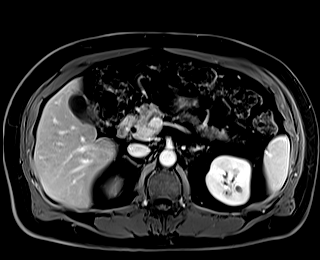
[im 88/88]
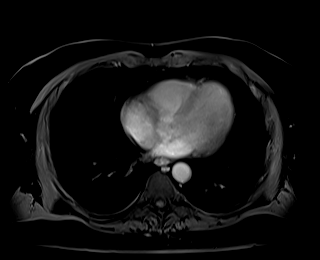

[Series 25: T1 dynamic · axial · 3.0mm · 1.25mm/px · z∈[-132,+129]mm · 3 of 88 slices shown (7 of 10)]
[im 1/88]
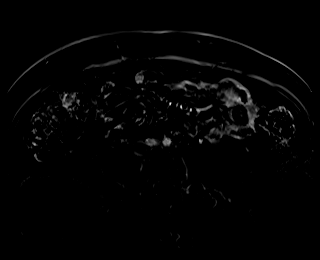
[im 44/88]
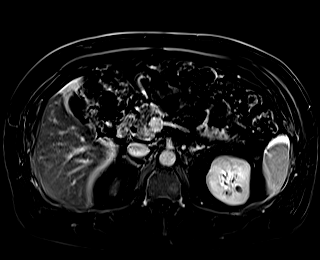
[im 88/88]
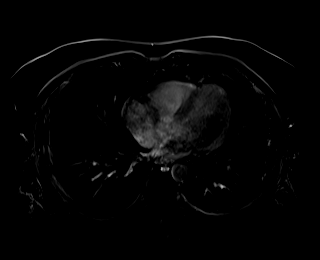

[Series 27: T1 dynamic · coronal · 3.0mm · 1.41mm/px · 3 of 72 slices shown (8 of 10)]
[im 1/72]
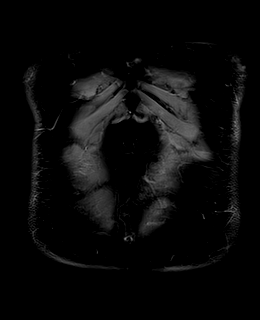
[im 36/72]
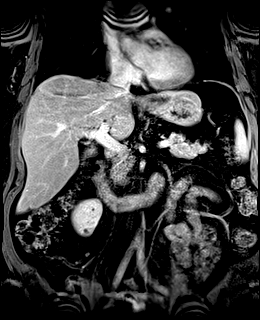
[im 72/72]
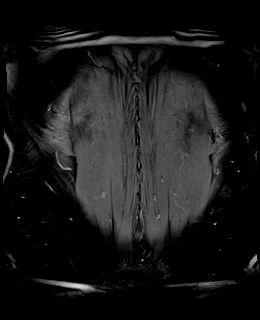

[Series 28: T2 · axial · 6.0mm · 1.56mm/px · 1 of 35 slices shown (2 of 2)]
[im 1/35]
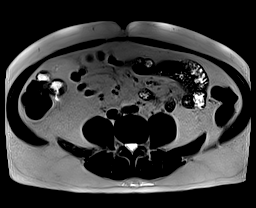

[Series 31: T1 dynamic · axial · 3.0mm · 1.25mm/px · z∈[-132,+129]mm · 3 of 88 slices shown (9 of 10)]
[im 1/88]
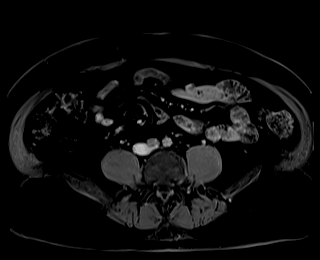
[im 44/88]
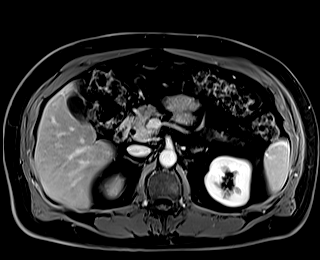
[im 88/88]
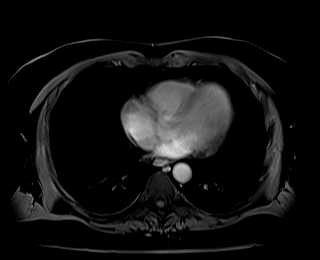

[Series 32: T1 dynamic · axial · 3.0mm · 1.25mm/px · z∈[-132,+129]mm · 3 of 88 slices shown (10 of 10)]
[im 1/88]
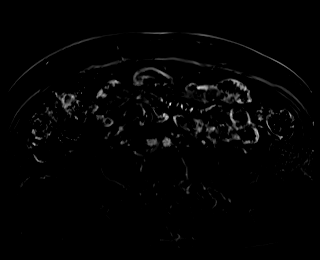
[im 44/88]
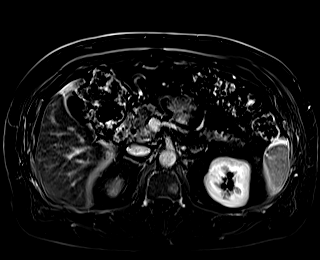
[im 88/88]
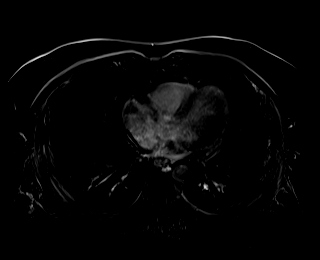

[48 of 48 positions shown; findings below may reference images not displayed]

FINDINGS: Lower chest: Unremarkable

Hepatobiliary: Small cysts in the lateral segment left hepatic lobe
and caudate lobe measure up to 0.5 cm in diameter.

Pancreas:  Unremarkable

Spleen:  Unremarkable

Adrenals/Urinary Tract: Mixed signal intensity 1.8 by 1.7 by 1.6 cm
lesion of the right mid kidney laterally primarily has low T2 and
intermediate T1 signal characteristics and appears to mildly enhance
centrally and along its periphery in a manner most compatible with a
small renal cell carcinoma. There is some in-phase loss of signal
within this lesion, and in conjunction with the low T2 signal the
appearance tends to favor papillary renal cell carcinoma.

Additional benign Bosniak category 1 and category 2 cysts are
present bilaterally.

The adrenal glands appear normal.

Stomach/Bowel: Potential enhancing mass or polyp along the upper
margin of the proximal stomach body for example on image 25 series
16 and image 31 series 27. This measures approximately 2.4 by 1.4 by
1.1 cm.

Vascular/Lymphatic:  Unremarkable

Other:  No supplemental non-categorized findings.

Musculoskeletal: Unremarkable
IMPRESSION: 1. 1.8 cm in long axis lesion of the right mid kidney laterally has
imaging characteristics favoring papillary renal cell carcinoma. No
regional adenopathy or findings of tumor thrombus in the right renal
vein. Urology referral recommended if not already performed.
2. Additionally, there is soft tissue fullness and enhancement along
the upper margin of the proximal stomach body such that a mass or
gastric polyp cannot be excluded. It is conceivable that this
represents an infolding of the upper gastric wall but the findings
are suspicious enough upper endoscopy would be recommended if not
recently performed.
3. Additional small benign hepatic and renal cysts.

## 2023-10-18 ENCOUNTER — Inpatient Hospital Stay: Attending: Hematology & Oncology

## 2023-10-18 DIAGNOSIS — Z905 Acquired absence of kidney: Secondary | ICD-10-CM | POA: Insufficient documentation

## 2023-10-18 DIAGNOSIS — Z85528 Personal history of other malignant neoplasm of kidney: Secondary | ICD-10-CM | POA: Insufficient documentation

## 2023-10-18 DIAGNOSIS — Z9221 Personal history of antineoplastic chemotherapy: Secondary | ICD-10-CM | POA: Insufficient documentation

## 2023-10-18 DIAGNOSIS — C884 Extranodal marginal zone b-cell lymphoma of mucosa-associated lymphoid tissue (malt-lymphoma) not having achieved remission: Secondary | ICD-10-CM | POA: Insufficient documentation

## 2023-10-18 DIAGNOSIS — Z923 Personal history of irradiation: Secondary | ICD-10-CM | POA: Insufficient documentation

## 2023-10-18 DIAGNOSIS — C187 Malignant neoplasm of sigmoid colon: Secondary | ICD-10-CM | POA: Insufficient documentation

## 2023-10-18 NOTE — Progress Notes (Signed)
 [facility] CSW Progress Note  Clinical Child psychotherapist contacted patient by phone to follow-up on emotional support.    Interventions: Discussed patient's request for counseling.  He said he is six years post treatment.       Follow Up Plan:  CSW will see patient on Wednesday, 9/24 at 9am.    Macario CHRISTELLA Au, LCSW Clinical Social Worker [facility]

## 2023-10-19 ENCOUNTER — Inpatient Hospital Stay: Admitting: Licensed Clinical Social Worker

## 2023-10-19 NOTE — Progress Notes (Signed)
 [facility] Clinical Social Work  Clinical Social Work was referred by self for emotional support.  Clinical Social Worker met with patient to offer support and assess for needs.     Interventions: Provided brief mental health counseling with regard to current life stressors.  After further discussion, it was agreed that patient would benefit more from couples counseling and individual counseling outside of the cancer center.  Per patient's request, made a referral to Francis Macintosh, patient's previous counselor.       Follow Up Plan:  Patient will follow up with individual counselor and     Macario CHRISTELLA Au, LCSW  Clinical Social Worker [facility]

## 2023-10-24 ENCOUNTER — Other Ambulatory Visit: Payer: Self-pay | Admitting: *Deleted

## 2023-10-24 ENCOUNTER — Inpatient Hospital Stay

## 2023-10-24 ENCOUNTER — Encounter: Payer: Self-pay | Admitting: Hematology & Oncology

## 2023-10-24 ENCOUNTER — Inpatient Hospital Stay: Admitting: Hematology & Oncology

## 2023-10-24 ENCOUNTER — Ambulatory Visit: Payer: Self-pay | Admitting: Hematology & Oncology

## 2023-10-24 ENCOUNTER — Other Ambulatory Visit: Payer: Self-pay

## 2023-10-24 VITALS — BP 152/103 | HR 77 | Temp 98.4°F | Resp 16 | Ht 73.5 in | Wt 245.0 lb

## 2023-10-24 DIAGNOSIS — E876 Hypokalemia: Secondary | ICD-10-CM

## 2023-10-24 DIAGNOSIS — Z9221 Personal history of antineoplastic chemotherapy: Secondary | ICD-10-CM | POA: Diagnosis not present

## 2023-10-24 DIAGNOSIS — Z8041 Family history of malignant neoplasm of ovary: Secondary | ICD-10-CM

## 2023-10-24 DIAGNOSIS — Z905 Acquired absence of kidney: Secondary | ICD-10-CM | POA: Diagnosis not present

## 2023-10-24 DIAGNOSIS — Z85038 Personal history of other malignant neoplasm of large intestine: Secondary | ICD-10-CM

## 2023-10-24 DIAGNOSIS — N289 Disorder of kidney and ureter, unspecified: Secondary | ICD-10-CM

## 2023-10-24 DIAGNOSIS — C772 Secondary and unspecified malignant neoplasm of intra-abdominal lymph nodes: Secondary | ICD-10-CM

## 2023-10-24 DIAGNOSIS — Z800 Family history of malignant neoplasm of digestive organs: Secondary | ICD-10-CM

## 2023-10-24 DIAGNOSIS — Z85528 Personal history of other malignant neoplasm of kidney: Secondary | ICD-10-CM | POA: Diagnosis not present

## 2023-10-24 DIAGNOSIS — C641 Malignant neoplasm of right kidney, except renal pelvis: Secondary | ICD-10-CM

## 2023-10-24 DIAGNOSIS — C187 Malignant neoplasm of sigmoid colon: Secondary | ICD-10-CM | POA: Diagnosis present

## 2023-10-24 DIAGNOSIS — C884 Extranodal marginal zone b-cell lymphoma of mucosa-associated lymphoid tissue (malt-lymphoma) not having achieved remission: Secondary | ICD-10-CM | POA: Diagnosis not present

## 2023-10-24 DIAGNOSIS — Z1379 Encounter for other screening for genetic and chromosomal anomalies: Secondary | ICD-10-CM

## 2023-10-24 DIAGNOSIS — Z923 Personal history of irradiation: Secondary | ICD-10-CM | POA: Diagnosis not present

## 2023-10-24 LAB — CBC WITH DIFFERENTIAL (CANCER CENTER ONLY)
Abs Immature Granulocytes: 0.02 K/uL (ref 0.00–0.07)
Basophils Absolute: 0.1 K/uL (ref 0.0–0.1)
Basophils Relative: 1 %
Eosinophils Absolute: 0.1 K/uL (ref 0.0–0.5)
Eosinophils Relative: 2 %
HCT: 42.9 % (ref 39.0–52.0)
Hemoglobin: 15.4 g/dL (ref 13.0–17.0)
Immature Granulocytes: 0 %
Lymphocytes Relative: 21 %
Lymphs Abs: 1.1 K/uL (ref 0.7–4.0)
MCH: 32 pg (ref 26.0–34.0)
MCHC: 35.9 g/dL (ref 30.0–36.0)
MCV: 89 fL (ref 80.0–100.0)
Monocytes Absolute: 0.5 K/uL (ref 0.1–1.0)
Monocytes Relative: 10 %
Neutro Abs: 3.3 K/uL (ref 1.7–7.7)
Neutrophils Relative %: 66 %
Platelet Count: 226 K/uL (ref 150–400)
RBC: 4.82 MIL/uL (ref 4.22–5.81)
RDW: 12.9 % (ref 11.5–15.5)
WBC Count: 5.1 K/uL (ref 4.0–10.5)
nRBC: 0 % (ref 0.0–0.2)

## 2023-10-24 LAB — CMP (CANCER CENTER ONLY)
ALT: 44 U/L (ref 0–44)
AST: 29 U/L (ref 15–41)
Albumin: 4.4 g/dL (ref 3.5–5.0)
Alkaline Phosphatase: 68 U/L (ref 38–126)
Anion gap: 11 (ref 5–15)
BUN: 12 mg/dL (ref 6–20)
CO2: 26 mmol/L (ref 22–32)
Calcium: 9.6 mg/dL (ref 8.9–10.3)
Chloride: 109 mmol/L (ref 98–111)
Creatinine: 0.96 mg/dL (ref 0.61–1.24)
GFR, Estimated: >60 mL/min (ref >60)
Glucose, Bld: 124 mg/dL — ABNORMAL HIGH (ref 70–99)
Potassium: 4.6 mmol/L (ref 3.5–5.1)
Sodium: 146 mmol/L — ABNORMAL HIGH (ref 135–145)
Total Bilirubin: 0.5 mg/dL (ref 0.0–1.2)
Total Protein: 7.2 g/dL (ref 6.5–8.1)

## 2023-10-24 LAB — LACTATE DEHYDROGENASE: LDH: 178 U/L (ref 98–192)

## 2023-10-24 LAB — CEA (ACCESS): CEA (CHCC): 1.46 ng/mL (ref 0.00–5.00)

## 2023-10-24 NOTE — Progress Notes (Signed)
 Hematology and Oncology Follow Up Visit  Vincent Strickland [id number] May 24, 1965 58 y.o. 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  He had moved down to [place]  for his job.  Unfortunately, because of family health issues, he had to move back to [place] .  He is now living around [place].  He will start his new job on Wednesday.  While he was down in [place] , he did see the oncologist at  [facility].  He said that he had a CT scan that was in March.  Everything looks fine for what he said.  He is doing okay.  He still has a neuropathy but this has not worsened.  He has had no problems with cough or shortness of breath.  He has had no change in bowel or bladder habits.  He has had no fever.  He has had no issues with COVID.  He has had no rashes.  There is been no bleeding.  Overall, I would have to say that his performance status is probably ECOG 1.     Medications:  Allergies as of 10/24/2023       Reactions   Niaspan [niacin] Other (See Comments)   Severe flushing   Other Itching, Other (See Comments)   After port placement pt had severe skin reaction to SKIN CLEANSER in armpits and neck area could see brush red brush marks, itched, burned and skin peeled off , severe burn Possibly Chlorhexidine     Percocet [oxycodone -acetaminophen ] Nausea Only, Other (See Comments)   hallucinations        Medication List        Accurate as of October 24, 2023  9:46 AM. If you have any questions, ask your  nurse or doctor.          amLODipine  5 MG tablet Commonly known as: NORVASC  Take 5 mg by mouth daily.   ezetimibe 10 MG tablet Commonly known as: ZETIA Take 10 mg by mouth daily.   gabapentin  300 MG capsule Commonly known as: NEURONTIN  TAKE 1 CAPSULE BY MOUTH IN THE MORNING AND 2 CAPSULE BY MOUTH EVERY DAY AT BEDTIME   irbesartan-hydrochlorothiazide  300-12.5 MG tablet Commonly known as: AVALIDE Take 1 tablet by mouth daily.   METAMUCIL FIBER PO Take 1 capsule by mouth in the morning and at bedtime.   potassium chloride  SA 20 MEQ tablet Commonly known as: KLOR-CON  M TAKE 2 TABLETS BY MOUTH TWICE DAILY   PROBIOTIC PO Take 1 capsule by mouth in the morning and at bedtime.   Rybelsus 7 MG Tabs Generic drug: Semaglutide Take 1 tablet by mouth daily.   Vitamin B12 1000 MCG Tbcr Take 1 tablet by mouth daily at 6 (six) AM.   Vitamin D3 50 MCG (2000 UT) capsule Take 3 capsules by mouth daily.        Allergies:  Allergies  Allergen Reactions   Niaspan [Niacin] Other (See Comments)    Severe flushing   Other Itching and Other (See Comments)    After port placement pt had severe skin  reaction to SKIN CLEANSER in armpits and neck area could see brush red brush marks, itched, burned and skin peeled off , severe burn  Possibly Chlorhexidine     Percocet [Oxycodone -Acetaminophen ] Nausea Only and Other (See Comments)    hallucinations    Past Medical History, Surgical history, Social history, and Family History were reviewed and updated.  Review of Systems: Review of Systems  Constitutional: Negative.   HENT: Negative.    Eyes: Negative.   Respiratory: Negative.    Cardiovascular: Negative.   Gastrointestinal: Negative.   Genitourinary: Negative.   Musculoskeletal: Negative.   Skin: Negative.   Neurological:  Positive for tingling.  Endo/Heme/Allergies: Negative.   Psychiatric/Behavioral: Negative.       Physical Exam:  height is 6' 1.5 (1.867 m) and  weight is 245 lb (111.1 kg). His oral temperature is 98.4 F (36.9 C). His blood pressure is 152/103 (abnormal) and his pulse is 77. His respiration is 16 and oxygen saturation is 98%.   Wt Readings from Last 3 Encounters:  10/24/23 245 lb (111.1 kg)  06/22/22 252 lb (114.3 kg)  03/22/22 263 lb (119.3 kg)    Physical Exam Vitals reviewed.  HENT:     Head: Normocephalic and atraumatic.  Eyes:     Pupils: Pupils are equal, round, and reactive to light.  Cardiovascular:     Rate and Rhythm: Normal rate and regular rhythm.     Heart sounds: Normal heart sounds.  Pulmonary:     Effort: Pulmonary effort is normal.     Breath sounds: Normal breath sounds.  Abdominal:     General: Bowel sounds are normal.     Palpations: Abdomen is soft.  Musculoskeletal:        General: No tenderness or deformity. Normal range of motion.     Cervical back: Normal range of motion.  Lymphadenopathy:     Cervical: No cervical adenopathy.  Skin:    General: Skin is warm and dry.     Findings: No erythema or rash.  Neurological:     Mental Status: He is alert and oriented to person, place, and time.  Psychiatric:        Behavior: Behavior normal.        Thought Content: Thought content normal.        Judgment: Judgment normal.      Lab Results  Component Value Date   WBC 5.1 10/24/2023   HGB 15.4 10/24/2023   HCT 42.9 10/24/2023   MCV 89.0 10/24/2023   PLT 226 10/24/2023   Lab Results  Component Value Date   FERRITIN 34 01/13/2016   IRON 41 (L) 01/13/2016   TIBC 369 01/13/2016   UIBC 328 01/13/2016   IRONPCTSAT 11 (L) 01/13/2016   Lab Results  Component Value Date   RBC 4.82 10/24/2023   No results found for: KPAFRELGTCHN, LAMBDASER, KAPLAMBRATIO No results found for: IGGSERUM, IGA, IGMSERUM No results found for: STEPHANY CARLOTA BENSON MARKEL EARLA JOANNIE DOC, MSPIKE, SPEI   Chemistry      Component Value Date/Time   NA 146 (H)  10/24/2023 0840   NA 146 (H) 12/29/2016 0838   K 4.6 10/24/2023 0840   K 3.6 12/29/2016 0838   CL 109 10/24/2023 0840   CL 105 12/29/2016 0838   CO2 26 10/24/2023 0840   CO2 29 12/29/2016 0838   BUN 12 10/24/2023 0840   BUN 11 12/29/2016 0838   CREATININE 0.96 10/24/2023 0840   CREATININE 0.9 12/29/2016 9161  Component Value Date/Time   CALCIUM  9.6 10/24/2023 0840   CALCIUM  9.9 12/29/2016 0838   ALKPHOS 68 10/24/2023 0840   ALKPHOS 71 12/29/2016 0838   AST 29 10/24/2023 0840   ALT 44 10/24/2023 0840   ALT 46 12/29/2016 0838   BILITOT 0.5 10/24/2023 0840     Impression and Plan: Mr. Vivona is a pleasant 58 yo gentleman with 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  He had radiation and chemotherapy.  We did a sandwich protocol.  He did quite well with this.  We completed treatment back in September 2018.  He subsequently has had a MALT lymphoma of the stomach.  He had radiation therapy for this.  He also had early stage kidney cancer of the right kidney.  He had a partial nephrectomy for this.  I am so happy that he is able to move back to [place] .  He really likes [place] .  I am sure that his new job will work out well for him.  We do have to get him set up with a scan.  He has always had scans done [facility] at [facility].  We will see about getting that set up for him.  From my point of view, we will still get him set up to be seen in 6 months.  He is always fun to talk to.  He comes in with his son.  He is a good guy.     Maude JONELLE Crease, MD 9/29/20259:46 AM

## 2023-11-02 ENCOUNTER — Telehealth: Payer: Self-pay | Admitting: *Deleted

## 2023-11-02 NOTE — Telephone Encounter (Signed)
 This nurse called patient and informed him that I faxed over a CT order to [facility]. Fax # [phone]. He is going to call in the next day or two and schedule an appointment. He verbalized understanding.

## 2023-11-08 ENCOUNTER — Ambulatory Visit (INDEPENDENT_AMBULATORY_CARE_PROVIDER_SITE_OTHER): Admitting: Psychology

## 2023-11-08 DIAGNOSIS — F4323 Adjustment disorder with mixed anxiety and depressed mood: Secondary | ICD-10-CM | POA: Diagnosis not present
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
## 2023-12-01 ENCOUNTER — Ambulatory Visit: Admitting: Psychology

## 2023-12-01 DIAGNOSIS — F4323 Adjustment disorder with mixed anxiety and depressed mood: Secondary | ICD-10-CM | POA: Diagnosis not present
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
## 2024-01-12 ENCOUNTER — Ambulatory Visit: Admitting: Psychology

## 2024-01-12 DIAGNOSIS — F4323 Adjustment disorder with mixed anxiety and depressed mood: Secondary | ICD-10-CM

## 2024-01-12 NOTE — Progress Notes (Signed)
 [facility] Counselor/Therapist Progress Note  Patient ID: Vincent Strickland, MRN: [id number],    Date: 01/12/2024  Time Spent: 58 mins       start time: 1600   end time: 1658  Treatment Type: Family Therapy  Reported Symptoms:  Pts present for session, in person in the office for the session, granting consent for the session.  Mental Status Exam: Appearance:  Casual     Behavior: Appropriate  Motor: Normal  Speech/Language:  Clear and Coherent  Affect: Appropriate  Mood: normal  Thought process: normal  Thought content:   WNL  Sensory/Perceptual disturbances:   WNL  Orientation: oriented to person, place, and time/date  Attention: Good  Concentration: Good  Memory: WNL  Fund of knowledge:  Good  Insight:   Good  Judgment:  Good  Impulse Control: Good   Risk Assessment: Danger to Self:  No Self-injurious Behavior: No Danger to Others: No Duty to Warn:no Physical Aggression / Violence:No  Access to Firearms a concern: No  Gang Involvement:No   Notified of Retirement  Subjective: [name] shares that she has an individual therapist that she has seen twice now and [place] likes her so far.  [name] has talked with her new therapist last week and was pleased with that conversation.  He shares that he and [name] have had a peaceful, uneventful time since our last session.  They lost their 90 yo cat since our last session and they were sad about that; they plan to get another cat at some point.  Pt shares that their daughter had reached out to [place] a few months ago and they have interacted through [facility]; one recent interaction was not pleasant but several of them had been nice.  Pts believe that their daughter may have a child that they do not know about.  Their daughter has divorced the man she married when she left the family home.  They had a great Thanksgiving with family and friends.  [name] shares that they have not had any other issues with money involving [place] and he  thankful for that.  They still have one financial issue with [name]'s sister Roberts) because of [name]'s last mis-step; they have a way to make that issue whole for her sister.cott shares that he is in a good place but he is worried that the same situation may recur.  They will work on finding their own couples therapist in [place] that will be in network for them.  Pts have been going to [facility] in [place]; [name] goes about 50% of the time.  Encouraged [name] to be more intentional about attending with [name] and their son Bebe.  Encouraged pts to continue with their self care activities.    Interventions: Cognitive Behavioral Therapy  Diagnosis:Adjustment disorder with mixed anxiety and depressed mood  Plan: Treatment Plan Strengths/Abilities:  Intelligent, Intuitive, Willing to participate in therapy Treatment Preferences:  Outpatient Individual Therapy Statement of Needs:  Patient is to use CBT, mindfulness and coping skills to help manage and/or decrease symptoms associated with their diagnosis. Symptoms:  Depressed/Irritable mood, worry, social withdrawal Problems Addressed:  Depressive thoughts, Sadness, Sleep issues, etc. Long Term Goals:  Pt to reduce overall level, frequency, and intensity of the feelings of depression/anxiety as evidenced by decreased irritability, negative self talk, and helpless feelings from 6 to 7 days/week to 0 to 1 days/week, per client report, for at least 3 consecutive months.  Progress: 30% Short Term Goals:  Pt to verbally express understanding of  the relationship between feelings of depression/anxiety and their impact on thinking patterns and behaviors.  Pt to verbalize an understanding of the role that distorted thinking plays in creating fears, excessive worry, and ruminations.  Progress: 30% Target Date:  11/30/2024 Frequency:  Bi-weekly Modality:  Cognitive Behavioral Therapy Interventions by Therapist:  Therapist will use CBT, Mindfulness  exercises, Coping skills and Referrals, as needed by client. Client has verbally approved this treatment plan.  Francis KATHEE Macintosh, [facility]

## 2024-04-20 ENCOUNTER — Inpatient Hospital Stay

## 2024-04-20 ENCOUNTER — Ambulatory Visit: Admitting: Hematology & Oncology
